# Patient Record
Sex: Female | Born: 1945 | Race: Black or African American | Hispanic: No | Marital: Single | Smoking: Never smoker
Health system: Southern US, Community
[De-identification: ages and names within clinical notes are randomized; demographics above are authoritative.]

## PROBLEM LIST (undated history)

## (undated) DIAGNOSIS — E785 Hyperlipidemia, unspecified: Secondary | ICD-10-CM

## (undated) DIAGNOSIS — D649 Anemia, unspecified: Secondary | ICD-10-CM

## (undated) DIAGNOSIS — E119 Type 2 diabetes mellitus without complications: Secondary | ICD-10-CM

## (undated) DIAGNOSIS — I10 Essential (primary) hypertension: Secondary | ICD-10-CM

## (undated) DIAGNOSIS — Z8673 Personal history of transient ischemic attack (TIA), and cerebral infarction without residual deficits: Secondary | ICD-10-CM

## (undated) DIAGNOSIS — I739 Peripheral vascular disease, unspecified: Secondary | ICD-10-CM

## (undated) DIAGNOSIS — Z992 Dependence on renal dialysis: Secondary | ICD-10-CM

## (undated) DIAGNOSIS — E1140 Type 2 diabetes mellitus with diabetic neuropathy, unspecified: Secondary | ICD-10-CM

## (undated) DIAGNOSIS — E213 Hyperparathyroidism, unspecified: Secondary | ICD-10-CM

## (undated) DIAGNOSIS — N186 End stage renal disease: Secondary | ICD-10-CM

## (undated) DIAGNOSIS — F0390 Unspecified dementia without behavioral disturbance: Secondary | ICD-10-CM

## (undated) SURGERY — Surgical Case
Anesthesia: *Unknown

---

## 2015-10-22 ENCOUNTER — Encounter: Payer: Self-pay | Admitting: Intensive Care

## 2015-10-22 ENCOUNTER — Emergency Department: Payer: Medicare Other

## 2015-10-22 ENCOUNTER — Inpatient Hospital Stay
Admission: EM | Admit: 2015-10-22 | Discharge: 2015-11-07 | DRG: 503 | Disposition: A | Discharge status: Skilled Nursing Facility | Payer: Medicare Other | Attending: Internal Medicine | Admitting: Internal Medicine

## 2015-10-22 DIAGNOSIS — N2581 Secondary hyperparathyroidism of renal origin: Secondary | ICD-10-CM | POA: Diagnosis present

## 2015-10-22 DIAGNOSIS — Y92009 Unspecified place in unspecified non-institutional (private) residence as the place of occurrence of the external cause: Secondary | ICD-10-CM | POA: Diagnosis not present

## 2015-10-22 DIAGNOSIS — L089 Local infection of the skin and subcutaneous tissue, unspecified: Secondary | ICD-10-CM

## 2015-10-22 DIAGNOSIS — Z9115 Patient's noncompliance with renal dialysis: Secondary | ICD-10-CM | POA: Diagnosis not present

## 2015-10-22 DIAGNOSIS — I739 Peripheral vascular disease, unspecified: Secondary | ICD-10-CM | POA: Diagnosis present

## 2015-10-22 DIAGNOSIS — I69351 Hemiplegia and hemiparesis following cerebral infarction affecting right dominant side: Secondary | ICD-10-CM

## 2015-10-22 DIAGNOSIS — I1311 Hypertensive heart and chronic kidney disease without heart failure, with stage 5 chronic kidney disease, or end stage renal disease: Secondary | ICD-10-CM | POA: Diagnosis present

## 2015-10-22 DIAGNOSIS — N186 End stage renal disease: Secondary | ICD-10-CM | POA: Diagnosis present

## 2015-10-22 DIAGNOSIS — Z79899 Other long term (current) drug therapy: Secondary | ICD-10-CM | POA: Diagnosis not present

## 2015-10-22 DIAGNOSIS — Z992 Dependence on renal dialysis: Secondary | ICD-10-CM | POA: Diagnosis not present

## 2015-10-22 DIAGNOSIS — E875 Hyperkalemia: Secondary | ICD-10-CM | POA: Diagnosis present

## 2015-10-22 DIAGNOSIS — M86171 Other acute osteomyelitis, right ankle and foot: Principal | ICD-10-CM | POA: Diagnosis present

## 2015-10-22 DIAGNOSIS — E872 Acidosis: Secondary | ICD-10-CM | POA: Diagnosis not present

## 2015-10-22 DIAGNOSIS — D62 Acute posthemorrhagic anemia: Secondary | ICD-10-CM | POA: Insufficient documentation

## 2015-10-22 DIAGNOSIS — T82856A Stenosis of peripheral vascular stent, initial encounter: Secondary | ICD-10-CM | POA: Diagnosis present

## 2015-10-22 DIAGNOSIS — Z7189 Other specified counseling: Secondary | ICD-10-CM | POA: Diagnosis not present

## 2015-10-22 DIAGNOSIS — Z7982 Long term (current) use of aspirin: Secondary | ICD-10-CM

## 2015-10-22 DIAGNOSIS — E785 Hyperlipidemia, unspecified: Secondary | ICD-10-CM | POA: Diagnosis not present

## 2015-10-22 DIAGNOSIS — M7989 Other specified soft tissue disorders: Secondary | ICD-10-CM

## 2015-10-22 DIAGNOSIS — Z8249 Family history of ischemic heart disease and other diseases of the circulatory system: Secondary | ICD-10-CM

## 2015-10-22 DIAGNOSIS — M869 Osteomyelitis, unspecified: Secondary | ICD-10-CM | POA: Diagnosis not present

## 2015-10-22 DIAGNOSIS — Z1810 Retained metal fragments, unspecified: Secondary | ICD-10-CM

## 2015-10-22 DIAGNOSIS — I10 Essential (primary) hypertension: Secondary | ICD-10-CM

## 2015-10-22 DIAGNOSIS — R778 Other specified abnormalities of plasma proteins: Secondary | ICD-10-CM

## 2015-10-22 DIAGNOSIS — D631 Anemia in chronic kidney disease: Secondary | ICD-10-CM | POA: Diagnosis present

## 2015-10-22 DIAGNOSIS — Z91128 Patient's intentional underdosing of medication regimen for other reason: Secondary | ICD-10-CM | POA: Diagnosis not present

## 2015-10-22 DIAGNOSIS — I69392 Facial weakness following cerebral infarction: Secondary | ICD-10-CM | POA: Diagnosis not present

## 2015-10-22 DIAGNOSIS — E079 Disorder of thyroid, unspecified: Secondary | ICD-10-CM | POA: Diagnosis present

## 2015-10-22 DIAGNOSIS — T3696XA Underdosing of unspecified systemic antibiotic, initial encounter: Secondary | ICD-10-CM | POA: Diagnosis present

## 2015-10-22 DIAGNOSIS — R7989 Other specified abnormal findings of blood chemistry: Secondary | ICD-10-CM

## 2015-10-22 DIAGNOSIS — Y712 Prosthetic and other implants, materials and accessory cardiovascular devices associated with adverse incidents: Secondary | ICD-10-CM | POA: Diagnosis present

## 2015-10-22 DIAGNOSIS — Z515 Encounter for palliative care: Secondary | ICD-10-CM | POA: Diagnosis not present

## 2015-10-22 DIAGNOSIS — R739 Hyperglycemia, unspecified: Secondary | ICD-10-CM

## 2015-10-22 DIAGNOSIS — R531 Weakness: Secondary | ICD-10-CM

## 2015-10-22 DIAGNOSIS — L8990 Pressure ulcer of unspecified site, unspecified stage: Secondary | ICD-10-CM | POA: Insufficient documentation

## 2015-10-22 DIAGNOSIS — B999 Unspecified infectious disease: Secondary | ICD-10-CM

## 2015-10-22 HISTORY — DX: Hyperlipidemia, unspecified: E78.5

## 2015-10-22 HISTORY — DX: Type 2 diabetes mellitus with diabetic neuropathy, unspecified: E11.40

## 2015-10-22 HISTORY — DX: Dependence on renal dialysis: Z99.2

## 2015-10-22 HISTORY — DX: Essential (primary) hypertension: I10

## 2015-10-22 HISTORY — DX: Personal history of transient ischemic attack (TIA), and cerebral infarction without residual deficits: Z86.73

## 2015-10-22 HISTORY — DX: End stage renal disease: N18.6

## 2015-10-22 HISTORY — DX: Type 2 diabetes mellitus without complications: E11.9

## 2015-10-22 LAB — COMPREHENSIVE METABOLIC PANEL
ALBUMIN: 3.4 g/dL — AB (ref 3.5–5.0)
ALK PHOS: 148 U/L — AB (ref 38–126)
ALT: 12 U/L — ABNORMAL LOW (ref 14–54)
AST: 13 U/L — AB (ref 15–41)
Anion gap: 17 — ABNORMAL HIGH (ref 5–15)
BILIRUBIN TOTAL: 0.7 mg/dL (ref 0.3–1.2)
BUN: 143 mg/dL — AB (ref 6–20)
CALCIUM: 9.7 mg/dL (ref 8.9–10.3)
CO2: 10 mmol/L — ABNORMAL LOW (ref 22–32)
CREATININE: 14.15 mg/dL — AB (ref 0.44–1.00)
Chloride: 111 mmol/L (ref 101–111)
GFR calc Af Amer: 3 mL/min — ABNORMAL LOW (ref >60)
GFR calc non Af Amer: 2 mL/min — ABNORMAL LOW (ref >60)
GLUCOSE: 64 mg/dL — AB (ref 65–99)
Potassium: 7.1 mmol/L (ref 3.5–5.1)
Sodium: 138 mmol/L (ref 135–145)
TOTAL PROTEIN: 7 g/dL (ref 6.5–8.1)

## 2015-10-22 LAB — GLUCOSE, CAPILLARY
GLUCOSE-CAPILLARY: 138 mg/dL — AB (ref 65–99)
GLUCOSE-CAPILLARY: 38 mg/dL — AB (ref 65–99)
Glucose-Capillary: 55 mg/dL — ABNORMAL LOW (ref 65–99)
Glucose-Capillary: 144 mg/dL — ABNORMAL HIGH (ref 65–99)
Glucose-Capillary: 56 mg/dL — ABNORMAL LOW (ref 65–99)

## 2015-10-22 LAB — MRSA PCR SCREENING: MRSA BY PCR: NEGATIVE

## 2015-10-22 LAB — CBC
HEMATOCRIT: 35.7 % (ref 35.0–47.0)
HEMOGLOBIN: 11.8 g/dL — AB (ref 12.0–16.0)
MCH: 33 pg (ref 26.0–34.0)
MCHC: 33 g/dL (ref 32.0–36.0)
MCV: 99.9 fL (ref 80.0–100.0)
Platelets: 174 10*3/uL (ref 150–440)
RBC: 3.57 MIL/uL — ABNORMAL LOW (ref 3.80–5.20)
RDW: 13.9 % (ref 11.5–14.5)
WBC: 4.8 10*3/uL (ref 3.6–11.0)

## 2015-10-22 LAB — TSH: TSH: 2.644 u[IU]/mL (ref 0.350–4.500)

## 2015-10-22 MED ORDER — PIPERACILLIN-TAZOBACTAM 3.375 G IVPB 30 MIN
3.3750 g | Freq: Once | INTRAVENOUS | Status: AC
  Administered 2015-10-22: 3.375 g via INTRAVENOUS
  Filled 2015-10-22: qty 50

## 2015-10-22 MED ORDER — SEVELAMER CARBONATE 800 MG PO TABS
1600.0000 mg | ORAL_TABLET | Freq: Three times a day (TID) | ORAL | Status: DC
  Administered 2015-10-23 – 2015-11-07 (×29): 1600 mg via ORAL
  Filled 2015-10-22 (×33): qty 2

## 2015-10-22 MED ORDER — LISINOPRIL 10 MG PO TABS
ORAL_TABLET | ORAL | Status: AC
  Administered 2015-10-22: 10 mg via ORAL
  Filled 2015-10-22: qty 1

## 2015-10-22 MED ORDER — METOPROLOL TARTRATE 25 MG PO TABS
12.5000 mg | ORAL_TABLET | Freq: Two times a day (BID) | ORAL | Status: DC
  Administered 2015-10-22 – 2015-10-30 (×11): 12.5 mg via ORAL
  Filled 2015-10-22 (×12): qty 1

## 2015-10-22 MED ORDER — SODIUM CHLORIDE 0.9 % IV SOLN
250.0000 mL | INTRAVENOUS | Status: DC | PRN
  Administered 2015-11-01 (×2): via INTRAVENOUS

## 2015-10-22 MED ORDER — INSULIN ASPART 100 UNIT/ML ~~LOC~~ SOLN
10.0000 [IU] | Freq: Once | SUBCUTANEOUS | Status: AC
  Administered 2015-10-22: 10 [IU] via INTRAVENOUS
  Filled 2015-10-22: qty 10

## 2015-10-22 MED ORDER — HEPARIN SODIUM (PORCINE) 5000 UNIT/ML IJ SOLN
5000.0000 [IU] | Freq: Three times a day (TID) | INTRAMUSCULAR | Status: DC
  Administered 2015-10-22 – 2015-10-23 (×2): 5000 [IU] via SUBCUTANEOUS
  Filled 2015-10-22 (×2): qty 1

## 2015-10-22 MED ORDER — POLYETHYLENE GLYCOL 3350 17 G PO PACK
17.0000 g | PACK | Freq: Every day | ORAL | Status: DC | PRN
  Administered 2015-10-28: 17 g via ORAL
  Filled 2015-10-22: qty 1

## 2015-10-22 MED ORDER — DOCUSATE SODIUM 100 MG PO CAPS
100.0000 mg | ORAL_CAPSULE | Freq: Two times a day (BID) | ORAL | Status: DC
  Administered 2015-10-22 – 2015-11-07 (×25): 100 mg via ORAL
  Filled 2015-10-22 (×25): qty 1

## 2015-10-22 MED ORDER — ACETAMINOPHEN 650 MG RE SUPP: 650.0000 mg | Freq: Four times a day (QID) | RECTAL | Status: DC | PRN

## 2015-10-22 MED ORDER — CALCIUM GLUCONATE 10 % IV SOLN
INTRAVENOUS | Status: AC
Start: 2015-10-22 — End: 2015-10-23
  Filled 2015-10-22: qty 10

## 2015-10-22 MED ORDER — METOPROLOL TARTRATE 25 MG PO TABS
ORAL_TABLET | ORAL | Status: AC
  Administered 2015-10-22: 12.5 mg via ORAL
  Filled 2015-10-22: qty 1

## 2015-10-22 MED ORDER — SODIUM POLYSTYRENE SULFONATE 15 GM/60ML PO SUSP
30.0000 g | Freq: Once | ORAL | Status: AC
  Administered 2015-10-22: 30 g via ORAL
  Filled 2015-10-22: qty 120

## 2015-10-22 MED ORDER — ASPIRIN EC 81 MG PO TBEC
DELAYED_RELEASE_TABLET | ORAL | Status: AC
  Administered 2015-10-22: 81 mg via ORAL
  Filled 2015-10-22: qty 1

## 2015-10-22 MED ORDER — CINACALCET HCL 30 MG PO TABS
90.0000 mg | ORAL_TABLET | Freq: Every day | ORAL | Status: DC
  Administered 2015-10-25 – 2015-11-05 (×10): 90 mg via ORAL
  Filled 2015-10-22 (×13): qty 3

## 2015-10-22 MED ORDER — VANCOMYCIN HCL IN DEXTROSE 1-5 GM/200ML-% IV SOLN
1000.0000 mg | Freq: Once | INTRAVENOUS | Status: AC
  Administered 2015-10-22: 1000 mg via INTRAVENOUS
  Filled 2015-10-22: qty 200

## 2015-10-22 MED ORDER — MORPHINE SULFATE (PF) 2 MG/ML IV SOLN
2.0000 mg | INTRAVENOUS | Status: DC | PRN
  Administered 2015-10-23 – 2015-11-06 (×11): 2 mg via INTRAVENOUS
  Filled 2015-10-22 (×11): qty 1

## 2015-10-22 MED ORDER — SODIUM CHLORIDE 0.9% FLUSH
3.0000 mL | Freq: Two times a day (BID) | INTRAVENOUS | Status: DC
  Administered 2015-10-22 – 2015-11-07 (×21): 3 mL via INTRAVENOUS

## 2015-10-22 MED ORDER — ATORVASTATIN CALCIUM 20 MG PO TABS
ORAL_TABLET | ORAL | Status: DC
Start: 2015-10-22 — End: 2015-10-22
  Filled 2015-10-22: qty 1

## 2015-10-22 MED ORDER — HYDROCODONE-ACETAMINOPHEN 5-325 MG PO TABS
1.0000 | ORAL_TABLET | ORAL | Status: DC | PRN
  Administered 2015-10-23 – 2015-11-02 (×11): 1 via ORAL
  Administered 2015-11-03: 2 via ORAL
  Administered 2015-11-03 (×2): 1 via ORAL
  Administered 2015-11-04 – 2015-11-06 (×7): 2 via ORAL
  Administered 2015-11-07: 1 via ORAL
  Filled 2015-10-22 (×2): qty 2
  Filled 2015-10-22 (×4): qty 1
  Filled 2015-10-22 (×2): qty 2
  Filled 2015-10-22: qty 1
  Filled 2015-10-22 (×2): qty 2
  Filled 2015-10-22 (×5): qty 1
  Filled 2015-10-22: qty 2
  Filled 2015-10-22 (×2): qty 1
  Filled 2015-10-22: qty 2
  Filled 2015-10-22 (×2): qty 1
  Filled 2015-10-22: qty 2
  Filled 2015-10-22: qty 1

## 2015-10-22 MED ORDER — ONDANSETRON HCL 4 MG PO TABS: 4.0000 mg | ORAL_TABLET | Freq: Four times a day (QID) | ORAL | Status: DC | PRN

## 2015-10-22 MED ORDER — ACETAMINOPHEN 325 MG PO TABS
650.0000 mg | ORAL_TABLET | Freq: Four times a day (QID) | ORAL | Status: DC | PRN
  Administered 2015-11-02: 650 mg via ORAL
  Filled 2015-10-22: qty 2

## 2015-10-22 MED ORDER — ATORVASTATIN CALCIUM 20 MG PO TABS
20.0000 mg | ORAL_TABLET | Freq: Every day | ORAL | Status: DC
  Administered 2015-10-25 – 2015-11-05 (×10): 20 mg via ORAL
  Filled 2015-10-22 (×11): qty 1

## 2015-10-22 MED ORDER — VENLAFAXINE HCL ER 37.5 MG PO CP24
37.5000 mg | ORAL_CAPSULE | Freq: Every day | ORAL | Status: DC
  Administered 2015-10-25 – 2015-11-07 (×11): 37.5 mg via ORAL
  Filled 2015-10-22 (×13): qty 1

## 2015-10-22 MED ORDER — AMLODIPINE BESYLATE 5 MG PO TABS
2.5000 mg | ORAL_TABLET | Freq: Every day | ORAL | Status: DC
  Administered 2015-10-22: 2.5 mg via ORAL

## 2015-10-22 MED ORDER — AMLODIPINE BESYLATE 5 MG PO TABS
ORAL_TABLET | ORAL | Status: AC
  Administered 2015-10-22: 2.5 mg via ORAL
  Filled 2015-10-22: qty 1

## 2015-10-22 MED ORDER — LISINOPRIL 10 MG PO TABS
10.0000 mg | ORAL_TABLET | Freq: Every day | ORAL | Status: DC
  Administered 2015-10-22 – 2015-10-25 (×2): 10 mg via ORAL
  Filled 2015-10-22: qty 1

## 2015-10-22 MED ORDER — PIPERACILLIN-TAZOBACTAM 3.375 G IVPB
3.3750 g | Freq: Two times a day (BID) | INTRAVENOUS | Status: DC
  Administered 2015-10-22 – 2015-10-29 (×14): 3.375 g via INTRAVENOUS
  Filled 2015-10-22 (×16): qty 50

## 2015-10-22 MED ORDER — SODIUM CHLORIDE 0.9 % IV SOLN
1.0000 g | Freq: Once | INTRAVENOUS | Status: DC
  Filled 2015-10-22: qty 10

## 2015-10-22 MED ORDER — ONDANSETRON HCL 4 MG/2ML IJ SOLN
4.0000 mg | Freq: Four times a day (QID) | INTRAMUSCULAR | Status: DC | PRN
  Administered 2015-10-23 – 2015-11-01 (×4): 4 mg via INTRAVENOUS
  Filled 2015-10-22 (×4): qty 2

## 2015-10-22 MED ORDER — SODIUM CHLORIDE 0.9% FLUSH
3.0000 mL | Freq: Two times a day (BID) | INTRAVENOUS | Status: DC
  Administered 2015-10-22 – 2015-11-04 (×19): 3 mL via INTRAVENOUS

## 2015-10-22 MED ORDER — NITROGLYCERIN 2 % TD OINT
1.0000 [in_us] | TOPICAL_OINTMENT | Freq: Four times a day (QID) | TRANSDERMAL | Status: DC
  Administered 2015-10-23 (×2): 1 [in_us] via TOPICAL
  Filled 2015-10-22: qty 30
  Filled 2015-10-22 (×2): qty 1

## 2015-10-22 MED ORDER — DEXTROSE 50 % IV SOLN
1.0000 | Freq: Once | INTRAVENOUS | Status: AC
  Administered 2015-10-22: 50 mL via INTRAVENOUS
  Filled 2015-10-22: qty 50

## 2015-10-22 MED ORDER — SODIUM CHLORIDE 0.9% FLUSH: 3.0000 mL | INTRAVENOUS | Status: DC | PRN

## 2015-10-22 MED ORDER — ASPIRIN EC 81 MG PO TBEC
81.0000 mg | DELAYED_RELEASE_TABLET | Freq: Every day | ORAL | Status: DC
  Administered 2015-10-22 – 2015-11-07 (×12): 81 mg via ORAL
  Filled 2015-10-22 (×11): qty 1

## 2015-10-22 MED ORDER — SENNA 8.6 MG PO TABS
1.0000 | ORAL_TABLET | Freq: Two times a day (BID) | ORAL | Status: DC
  Administered 2015-10-22 – 2015-11-07 (×25): 8.6 mg via ORAL
  Filled 2015-10-22 (×25): qty 1

## 2015-10-22 NOTE — Progress Notes (Signed)
Post HD assessment  

## 2015-10-22 NOTE — Progress Notes (Signed)
START OF HD TX 

## 2015-10-22 NOTE — Progress Notes (Signed)
Pharmacy Antibiotic Note  Tracy CarrowLouvenia Drake is a 70 y.o. female admitted on 10/22/2015 with Osteomyelitis of right great toe.  Pharmacy has been consulted for vancomycin and Zosyn dosing. Patient had PMH of ESRD w/HD and DM, and has missed two days of dialysis. Scr today 14.15.   Plan: Patient received Vancomycin 1gm IV X1 and Zosyn 3.375 IV x1 in ED.  Will need to f/u up with patients HD schedule while in hospital and schedule vancomycin with HD.   Will start patient on Zosyn 3.375 IV EI every 12 hours.   Height: 5\' 2"  (157.5 cm) Weight: 135 lb (61.236 kg) IBW/kg (Calculated) : 50.1  Temp (24hrs), Avg:98.3 F (36.8 C), Min:98.3 F (36.8 C), Max:98.3 F (36.8 C)   Recent Labs Lab 10/22/15 1240  WBC 4.8  CREATININE 14.15*    Estimated Creatinine Clearance: 3.1 mL/min (by C-G formula based on Cr of 14.15).    No Known Allergies  Antimicrobials this admission: 7/10 Vancomcyin >>  7/10 Zosyn >>    Microbiology results: 7/10 BCx: pending  Thank you for allowing pharmacy to be a part of this patient's care.  Tracy Drake, PharmD Clinical Pharmacist 10/22/2015 4:19 PM

## 2015-10-22 NOTE — Progress Notes (Signed)
PRE HD   
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
## 2015-10-22 NOTE — Progress Notes (Signed)
[facility]  ROUNDING NOTE   Subjective:   Patient is a poor historian and unable to recall history. She missed her last two dialysis treatments. Her potassium of 7.1 and emergent hemodialysis treatment.     HEMODIALYSIS FLOWSHEET:  Blood Flow Rate (mL/min): 400 mL/min Arterial Pressure (mmHg): -180 mmHg Venous Pressure (mmHg): 190 mmHg Transmembrane Pressure (mmHg): 40 mmHg Ultrafiltration Rate (mL/min): 830 mL/min Dialysate Flow Rate (mL/min): 600 ml/min Conductivity: Machine : 14 Conductivity: Machine : 14 Dialysis Fluid Bolus: Normal Saline Bolus Amount (mL): 50 mL Dialysate Change: 2K Intra-Hemodialysis Comments: 1000ml removed, pt stablr    Objective:  Vital signs in last 24 hours:  Temp:  [97.4 F (36.3 C)-98.3 F (36.8 C)] 97.4 F (36.3 C) (07/10 1735) Pulse Rate:  [60-82] 74 (07/10 1900) Resp:  [6-27] 17 (07/10 2000) BP: (89-185)/(59-103) 92/64 mmHg (07/10 2000) SpO2:  [98 %-100 %] 100 % (07/10 1900) Weight:  [61.236 kg (135 lb)] 61.236 kg (135 lb) (07/10 1250)  Weight change:  Filed Weights   10/22/15 1250  Weight: 61.236 kg (135 lb)    Intake/Output:     Intake/Output this shift:     Physical Exam: General: NAD, laying in bed  Head: +hard of hearing  Eyes: Anicteric, PERRL  Neck: Supple, trachea midline  Lungs:  Clear to auscultation  Heart: Regular rate and rhythm  Abdomen:  Soft, nontender,   Extremities: no peripheral edema.  Neurologic: Oriented to self only  Skin: No lesions  Access: RIJ permcath    Basic Metabolic Panel:  Recent Labs Lab 10/22/15 1240  NA 138  K 7.1*  CL 111  CO2 10*  GLUCOSE 64*  BUN 143*  CREATININE 14.15*  CALCIUM 9.7    Liver Function Tests:  Recent Labs Lab 10/22/15 1240  AST 13*  ALT 12*  ALKPHOS 148*  BILITOT 0.7  PROT 7.0  ALBUMIN 3.4*   No results for input(s): LIPASE, AMYLASE in the last 168 hours. No results for input(s): AMMONIA in the last 168  hours.  CBC:  Recent Labs Lab 10/22/15 1240  WBC 4.8  HGB 11.8*  HCT 35.7  MCV 99.9  PLT 174    Cardiac Enzymes: No results for input(s): CKTOTAL, CKMB, CKMBINDEX, TROPONINI in the last 168 hours.  BNP: Invalid input(s): POCBNP  CBG:  Recent Labs Lab 10/22/15 1310 10/22/15 1502  GLUCAP 55* 144*    Microbiology: No results found for this or any previous visit.  Coagulation Studies: No results for input(s): LABPROT, INR in the last 72 hours.  Urinalysis: No results for input(s): COLORURINE, LABSPEC, PHURINE, GLUCOSEU, HGBUR, BILIRUBINUR, KETONESUR, PROTEINUR, UROBILINOGEN, NITRITE, LEUKOCYTESUR in the last 72 hours.  Invalid input(s): APPERANCEUR    Imaging: Dg Chest Portable 1 View  10/22/2015  CLINICAL DATA:  Right great toe pain. Infection. The patient has missed her last 2 dialysis appointments. EXAM: PORTABLE CHEST 1 VIEW COMPARISON:  None. FINDINGS: The heart is mildly enlarged. Atherosclerotic calcifications are present within the aortic arch. A left subclavian vein stent is in place. A right IJ dialysis catheter is in place. The tip is in the right ventricle. Mild pulmonary vascular congestion is present without frank edema. No focal airspace consolidation is present. The visualized soft tissues and bony thorax are unremarkable. IMPRESSION: 1. Mild cardiomegaly and pulmonary vascular congestion. 2. Atherosclerosis of the aortic arch. 3. Left subclavian stent. Electronically Signed   By: Marin Robertshristopher  Mattern M.D.   On: 10/22/2015 13:54   Dg Toe Great Right  10/22/2015  CLINICAL DATA:  Right great toe pain. Toe nail removed approximately 1 week ago without healing. EXAM: RIGHT GREAT TOE COMPARISON:  None. FINDINGS: There are vague lucencies within the tuft of the distal phalanx. No fracture line or displaced fracture fragment seen. Atherosclerotic changes noted within the surrounding soft tissues. IMPRESSION: 1. Vague lucencies within the tuft of the distal phalanx  of the great toe. Based on the lateral projection, this is suspected to be due to overlying soft tissue edema. Demineralization and/or early destructive changes of osteomyelitis cannot be excluded. If clinical concern for osteomyelitis, recommend MRI for more definitive characterization. 2. Atherosclerosis. Electronically Signed   By: Bary Richard M.D.   On: 10/22/2015 13:57     Medications:     . amLODipine  2.5 mg Oral Daily  . aspirin EC  81 mg Oral Daily  . atorvastatin  20 mg Oral q1800  . calcium gluconate 1 GM IV  1 g Intravenous Once  . calcium gluconate      . cinacalcet  90 mg Oral Q supper  . lisinopril  10 mg Oral Daily  . metoprolol tartrate  12.5 mg Oral BID  . nitroGLYCERIN  1 inch Topical Q6H  . sevelamer carbonate  1,600 mg Oral TID WC  . [START ON 10/23/2015] venlafaxine XR  37.5 mg Oral Q breakfast     Assessment/ Plan:  Ms. Gwenivere Hiraldo is a 70 y.o. black female with hypertension, diabetes mellitus type II, hyperlipidemia, thyroid disorder and ESRD on hemodialysis. Pt recently moved from [place] via [place]. Now staying with her sister.   [facility] TTS [address]  RIJ permcath  1. End Stage Renal Disease with hyperkalemia: requiring emergent hemodialysis. Seen and examined on treatment.   2. Hypertension: hypertensive on admission but now low on treatment - home regimen of amlodipine, lisinopril  3. Secondary Hyperparathyroidism:  - continue sevelamer for phos binding - cinacalcet  4. Anemia of chronic kidney disease: hemoglobin 11.8 - no acute indication for epo    LOS: 0 Ethelbert Thain 7/10/20178:16 PM

## 2015-10-22 NOTE — ED Provider Notes (Signed)
[facility] Emergency Department Provider Note   ____________________________________________    I have reviewed the triage vital signs and the nursing notes.   HISTORY  Chief Complaint Toe Pain  History limited by poor historian   HPI Tracy Drake is a 70 y.o. female who presents for reported right great toe pain. Apparently the patient had a procedure on the toenail about one week ago.She also reports that she missed 2 dialysis appointments. It is not clear who called the ambulance or why. Glucose reported at 56. Patient complains of right toe pain but otherwise denies shortness of breath or chest pain.  Past medical history End-stage renal disease Hypertension past medical history  Past surgical history Left AV fistula Right subclavian catheter    No current outpatient prescriptions on file.  Allergies Review of patient's allergies indicates no known allergies.  History reviewed. No pertinent family history.  Social History Social History  Substance Use Topics  . Smoking status: Never Smoker   . Smokeless tobacco: Never Used  . Alcohol Use: No    Review of SystemsLimited  Constitutional: No fever/chills Eyes: No visual changes.  ENT: No sore throat. Cardiovascular: Denies chest pain. Respiratory: Denies shortness of breath. Gastrointestinal: No abdominal pain.   Genitourinary: Negative for dysuria. Musculoskeletal: Right toe pain Skin: Negative for rash. Neurological: Negative for headaches or weakness  10-point ROS otherwise negative.  ____________________________________________   PHYSICAL EXAM:  VITAL SIGNS: ED Triage Vitals  Enc Vitals Group     BP 10/22/15 1250 170/60 mmHg     Pulse Rate 10/22/15 1250 78     Resp 10/22/15 1250 17     Temp 10/22/15 1250 98.3 F (36.8 C)     Temp Source 10/22/15 1250 Oral     SpO2 10/22/15 1250 100 %     Weight 10/22/15 1250 135 lb (61.236 kg)     Height --      Head  Cir --      Peak Flow --      Pain Score 10/22/15 1253 9     Pain Loc --      Pain Edu? --      Excl. in GC? --     Constitutional: Alert and oriented. No acute distress. Poor historian Eyes: Conjunctivae are normal.  Head: Atraumatic.Normocephalic Nose: No congestion/rhinnorhea. Mouth/Throat: Mucous membranes are moist.   Cardiovascular: Normal rate, regular rhythm. Grossly normal heart sounds.  Good peripheral circulation. Respiratory: Normal respiratory effort.  No retractions. Lungs CTAB. Gastrointestinal: Soft and nontender. No distention.  No CVA tenderness. Genitourinary: deferred Musculoskeletal: No lower extremity tenderness nor edema.  Warm and well perfused feet bilaterally, right great toe is dusky and foul smelling, 2+ DP pulses bilaterally Neurologic:  Normal speech and language. No gross focal neurologic deficits are appreciated.  Skin:  Skin is warm, dry and intact. No rash noted. Psychiatric: Mood and affect are normal. Speech and behavior are normal.  ____________________________________________   LABS (all labs ordered are listed, but only abnormal results are displayed)  Labs Reviewed  CBC - Abnormal; Notable for the following:    RBC 3.57 (*)    Hemoglobin 11.8 (*)    All other components within normal limits  COMPREHENSIVE METABOLIC PANEL - Abnormal; Notable for the following:    Potassium 7.1 (*)    CO2 10 (*)    Glucose, Bld 64 (*)    BUN 143 (*)    Creatinine, Ser 14.15 (*)    Albumin 3.4 (*)  AST 13 (*)    ALT 12 (*)    Alkaline Phosphatase 148 (*)    GFR calc non Af Amer 2 (*)    GFR calc Af Amer 3 (*)    Anion gap 17 (*)    All other components within normal limits  GLUCOSE, CAPILLARY - Abnormal; Notable for the following:    Glucose-Capillary 55 (*)    All other components within normal limits  CULTURE, BLOOD (ROUTINE X 2)  CULTURE, BLOOD (ROUTINE X 2)   ____________________________________________  EKG  ED ECG REPORT I, Jene EveryKINNER,  Lokelani Lutes, the attending physician, personally viewed and interpreted this ECG.  Date: 10/22/2015 EKG Time: 12:09 PM Rate: 69 Rhythm: normal sinus rhythm QRS Axis: normal Intervals: normal ST/T Wave abnormalities: Nonspecific changes Conduction Disturbances: none Narrative Interpretation: unremarkable  ____________________________________________  RADIOLOGY  Right great toe x-ray concerning for possible osteomyelitis ____________________________________________   PROCEDURES  Procedure(s) performed: No    Critical Care performed: yes  CRITICAL CARE Performed by: Jene EveryKINNER, Jose Alleyne   Total critical care time: 30 minutes  Critical care time was exclusive of separately billable procedures and treating other patients.  Critical care was necessary to treat or prevent imminent or life-threatening deterioration.  Critical care was time spent personally by me on the following activities: development of treatment plan with patient and/or surrogate as well as nursing, discussions with consultants, evaluation of patient's response to treatment, examination of patient, obtaining history from patient or surrogate, ordering and performing treatments and interventions, ordering and review of laboratory studies, ordering and review of radiographic studies, pulse oximetry and re-evaluation of patient's condition.  ____________________________________________   INITIAL IMPRESSION / ASSESSMENT AND PLAN / ED COURSE  Pertinent labs & imaging results that were available during my care of the patient were reviewed by me and considered in my medical decision making (see chart for details).  Patient presents with right great toe pain. She also complains of missing dialysis 2. We will check labs, x-ray right great toe and reevaluate.  Patient's potassium is 7.1, discussed with nephrology Dr. Ronn MelenaKolloru who recommends Kayexalate, insulin, dextrose, calcium and he will arrange dialysis  Toe x-ray  concerning for possible ostial myelitis, broad-spectrum IV antibiotics started  ____________________________________________   FINAL CLINICAL IMPRESSION(S) / ED DIAGNOSES  Final diagnoses:  Acute hyperkalemia  Toe infection  Acute osteomyelitis of toe, right (HCC)      NEW MEDICATIONS STARTED DURING THIS VISIT:  New Prescriptions   No medications on file     Note:  This document was prepared using Dragon voice recognition software and may include unintentional dictation errors.    Jene Everyobert Alfons Sulkowski, MD 10/22/15 1447

## 2015-10-22 NOTE — Progress Notes (Signed)
HD tx ended early due to low b/p.  MD aware.

## 2015-10-22 NOTE — ED Notes (Signed)
Patient arrived by EMS from home. Patient came in for R great toe pain. Patient has the toenail removed about a week ago. Patient has missed her last two dialysis appointments. Patient has dialysis cath on R chest. EMS blood sugar 56. Patient is A&O x4

## 2015-10-22 NOTE — Progress Notes (Signed)
PRE HD assessment 

## 2015-10-22 NOTE — Progress Notes (Signed)
Post Hd   

## 2015-10-22 NOTE — ED Notes (Signed)
Attempted an IV in the right hand without success.

## 2015-10-23 ENCOUNTER — Encounter: Payer: Self-pay | Admitting: Physician Assistant

## 2015-10-23 ENCOUNTER — Inpatient Hospital Stay: Admit: 2015-10-23 | Payer: Medicare Other

## 2015-10-23 ENCOUNTER — Inpatient Hospital Stay (HOSPITAL_COMMUNITY)
Admit: 2015-10-23 | Discharge: 2015-10-23 | Disposition: A | Discharge status: Home / Self Care | Payer: Medicare Other | Attending: Physician Assistant | Admitting: Physician Assistant

## 2015-10-23 DIAGNOSIS — R7989 Other specified abnormal findings of blood chemistry: Secondary | ICD-10-CM

## 2015-10-23 DIAGNOSIS — E785 Hyperlipidemia, unspecified: Secondary | ICD-10-CM

## 2015-10-23 DIAGNOSIS — I10 Essential (primary) hypertension: Secondary | ICD-10-CM

## 2015-10-23 LAB — GLUCOSE, CAPILLARY
GLUCOSE-CAPILLARY: 77 mg/dL (ref 65–99)
GLUCOSE-CAPILLARY: 117 mg/dL — AB (ref 65–99)
GLUCOSE-CAPILLARY: 73 mg/dL (ref 65–99)
GLUCOSE-CAPILLARY: 92 mg/dL (ref 65–99)

## 2015-10-23 LAB — CBC
HCT: 34 % — ABNORMAL LOW (ref 35.0–47.0)
Hemoglobin: 11.6 g/dL — ABNORMAL LOW (ref 12.0–16.0)
MCH: 33.4 pg (ref 26.0–34.0)
MCHC: 34.2 g/dL (ref 32.0–36.0)
MCV: 97.6 fL (ref 80.0–100.0)
Platelets: 167 10*3/uL (ref 150–440)
RBC: 3.48 MIL/uL — AB (ref 3.80–5.20)
RDW: 13.8 % (ref 11.5–14.5)
WBC: 4.3 10*3/uL (ref 3.6–11.0)

## 2015-10-23 LAB — BASIC METABOLIC PANEL
ANION GAP: 12 (ref 5–15)
BUN: 73 mg/dL — ABNORMAL HIGH (ref 6–20)
CALCIUM: 9 mg/dL (ref 8.9–10.3)
CO2: 24 mmol/L (ref 22–32)
Chloride: 103 mmol/L (ref 101–111)
Creatinine, Ser: 9.51 mg/dL — ABNORMAL HIGH (ref 0.44–1.00)
GFR calc non Af Amer: 4 mL/min — ABNORMAL LOW (ref >60)
GFR, EST AFRICAN AMERICAN: 4 mL/min — AB (ref >60)
Glucose, Bld: 87 mg/dL (ref 65–99)
Potassium: 4.7 mmol/L (ref 3.5–5.1)
Sodium: 139 mmol/L (ref 135–145)

## 2015-10-23 LAB — RENAL FUNCTION PANEL
ALBUMIN: 3.1 g/dL — AB (ref 3.5–5.0)
ANION GAP: 13 (ref 5–15)
BUN: 69 mg/dL — AB (ref 6–20)
CHLORIDE: 106 mmol/L (ref 101–111)
CO2: 20 mmol/L — AB (ref 22–32)
Calcium: 8.8 mg/dL — ABNORMAL LOW (ref 8.9–10.3)
Creatinine, Ser: 9.01 mg/dL — ABNORMAL HIGH (ref 0.44–1.00)
GFR calc Af Amer: 4 mL/min — ABNORMAL LOW (ref >60)
GFR, EST NON AFRICAN AMERICAN: 4 mL/min — AB (ref >60)
GLUCOSE: 66 mg/dL (ref 65–99)
PHOSPHORUS: 4.8 mg/dL — AB (ref 2.5–4.6)
POTASSIUM: 5.1 mmol/L (ref 3.5–5.1)
Sodium: 139 mmol/L (ref 135–145)

## 2015-10-23 LAB — PROTIME-INR
INR: 1.11
PROTHROMBIN TIME: 14.5 s (ref 11.4–15.0)

## 2015-10-23 LAB — HEPATITIS B CORE ANTIBODY, TOTAL: Hep B Core Total Ab: POSITIVE — AB

## 2015-10-23 LAB — TROPONIN I
Troponin I: <0.03 ng/mL (ref <0.03)
Troponin I: 1.27 ng/mL (ref <0.03)

## 2015-10-23 LAB — HEMOGLOBIN A1C: HEMOGLOBIN A1C: 4.8 % (ref 4.0–6.0)

## 2015-10-23 LAB — HEPATITIS B SURFACE ANTIGEN: HEP B S AG: NEGATIVE

## 2015-10-23 LAB — APTT: APTT: 47 s — AB (ref 24–36)

## 2015-10-23 LAB — HEPATITIS B SURFACE ANTIBODY,QUALITATIVE: Hep B S Ab: REACTIVE

## 2015-10-23 MED ORDER — VANCOMYCIN HCL IN DEXTROSE 750-5 MG/150ML-% IV SOLN
750.0000 mg | INTRAVENOUS | Status: DC
  Administered 2015-10-23 – 2015-10-25 (×2): 750 mg via INTRAVENOUS
  Filled 2015-10-23 (×3): qty 150

## 2015-10-23 MED ORDER — HEPARIN SODIUM (PORCINE) 5000 UNIT/ML IJ SOLN
5000.0000 [IU] | Freq: Three times a day (TID) | INTRAMUSCULAR | Status: DC
  Administered 2015-10-23 – 2015-11-07 (×33): 5000 [IU] via SUBCUTANEOUS
  Filled 2015-10-23 (×33): qty 1

## 2015-10-23 MED ORDER — VANCOMYCIN HCL IN DEXTROSE 750-5 MG/150ML-% IV SOLN
750.0000 mg | Freq: Once | INTRAVENOUS | Status: AC
  Administered 2015-10-23: 750 mg via INTRAVENOUS
  Filled 2015-10-23: qty 150

## 2015-10-23 MED ORDER — HEPARIN BOLUS VIA INFUSION
3000.0000 [IU] | Freq: Once | INTRAVENOUS | Status: DC
  Filled 2015-10-23: qty 3000

## 2015-10-23 MED ORDER — CHLORHEXIDINE GLUCONATE CLOTH 2 % EX PADS: 6.0000 | MEDICATED_PAD | Freq: Once | CUTANEOUS | Status: DC

## 2015-10-23 MED ORDER — HEPARIN (PORCINE) IN NACL 100-0.45 UNIT/ML-% IJ SOLN
850.0000 [IU]/h | INTRAMUSCULAR | Status: DC
  Filled 2015-10-23: qty 250

## 2015-10-23 MED ORDER — SODIUM CHLORIDE 0.9 % IV SOLN
INTRAVENOUS | Status: DC
  Administered 2015-10-23: 20:00:00 via INTRAVENOUS

## 2015-10-23 MED ORDER — METOCLOPRAMIDE HCL 5 MG/ML IJ SOLN
5.0000 mg | Freq: Once | INTRAMUSCULAR | Status: AC
  Administered 2015-10-25: 5 mg via INTRAVENOUS
  Filled 2015-10-23: qty 2

## 2015-10-23 NOTE — Progress Notes (Signed)
Attempted to contact pt sister for consents for procedures.

## 2015-10-23 NOTE — Progress Notes (Signed)
ANTICOAGULATION CONSULT NOTE - Initial Consult  Pharmacy Consult for Heparin drip Indication: ischemic toe/elevated troponin  No Known Allergies  Patient Measurements: Height: 5\' 2"  (157.5 cm) Weight: 138 lb 1.6 oz (62.642 kg) IBW/kg (Calculated) : 50.1 Heparin Dosing Weight: 62.6 kg  Vital Signs: Temp: 97.9 F (36.6 C) (07/11 0741) Temp Source: Oral (07/11 0741) BP: 116/51 mmHg (07/11 0741) Pulse Rate: 67 (07/11 0741)  Labs:  Recent Labs  10/22/15 1240 10/22/15 2145 10/23/15 0655  HGB 11.8*  --   --   HCT 35.7  --   --   PLT 174  --   --   CREATININE 14.15*  --  9.01*  TROPONINI  --  <0.03 1.27*    Estimated Creatinine Clearance: 5 mL/min (by C-G formula based on Cr of 9.01).  Assessment: 70 yo female with possible osteomyelitis of right great toe on abx now starting heparin drip for possible ischemic toe and elevated troponin.   Pt last received dose of SQ heparin at 0645 (~4-5 h ago).   STAT CBC, aPTT, INR ordered for baseline labs. Called lab to see if labs could be added on but no tube available. Per lab, pt is a hard stick and AM labs were drawn from a fingerstick. Pt with limited sites for blood draws as pt is an HD pt; said would try to obtain labs.   7/10: Hgb 11.8, Plt 174  Goal of Therapy:  Heparin level 0.3-0.7 units/ml Monitor platelets by anticoagulation protocol: Yes   Plan:  Heparin 3000 units IV x1 bolus then 850 units/hr (=8.5 ml/hr) Heparin level in 8h CBC in AM  Pharmacy will continue to follow.   Crist FatWang, Jessamyn Watterson L 10/23/2015,11:25 AM

## 2015-10-23 NOTE — Progress Notes (Signed)
Spoke with Dr. Wyn Quakerew and also Eula Listenyan Dunn, PA. Plan for tomorrow is to proceed with angiogram, nuclear stress test will be cancelled. Will continue to attempt to contact family members to update with plan.

## 2015-10-23 NOTE — Progress Notes (Signed)
Patient ID: Tracy Drake, female   DOB: 1946/02/07, 70 y.o.   MRN: [id number] [facility] PROGRESS NOTE  Amee Boothe WUJ:[id number] DOB: 01-11-1946 DOA: 10/22/2015 PCP: Pcp Not In System  HPI/Subjective: Patient complaining of right first toe pain. No complaints of chest pain or shortness of breath.  Objective: Filed Vitals:   10/23/15 0412 10/23/15 0741  BP: 123/58 116/51  Pulse: 73 67  Temp: 98.2 F (36.8 C) 97.9 F (36.6 C)  Resp: 19     Filed Weights   10/22/15 1250 10/22/15 2134 10/23/15 0436  Weight: 61.236 kg (135 lb) 62.279 kg (137 lb 4.8 oz) 62.642 kg (138 lb 1.6 oz)    ROS: Review of Systems  Constitutional: Negative for fever and chills.  Eyes: Negative for blurred vision.  Respiratory: Negative for cough and shortness of breath.   Cardiovascular: Negative for chest pain.  Gastrointestinal: Positive for nausea and vomiting. Negative for abdominal pain, diarrhea and constipation.  Genitourinary: Negative for dysuria.  Musculoskeletal: Positive for joint pain.  Neurological: Negative for dizziness and headaches.   Exam: Physical Exam  HENT:  Nose: No mucosal edema.  Mouth/Throat: No oropharyngeal exudate or posterior oropharyngeal edema.  Eyes: Conjunctivae, EOM and lids are normal. Pupils are equal, round, and reactive to light.  Neck: No JVD present. Carotid bruit is not present. No edema present. No thyroid mass and no thyromegaly present.  Cardiovascular: S1 normal and S2 normal.  Exam reveals no gallop.   Murmur heard.  Systolic murmur is present with a grade of 2/6  Pulses:      Dorsalis pedis pulses are 0 on the right side, and 0 on the left side.  Respiratory: No respiratory distress. She has no wheezes. She has no rhonchi. She has no rales.  GI: Soft. Bowel sounds are normal. There is no tenderness.  Musculoskeletal:       Right ankle: She exhibits no swelling.       Left ankle: She exhibits no swelling.  Lymphadenopathy:    She has no  cervical adenopathy.  Neurological: She is alert.  Skin: Skin is warm. Nails show no clubbing.  Right first toe with blackish discoloration looks like it's starting to demarcate.  Psychiatric: She has a normal mood and affect.      Data Reviewed: Basic Metabolic Panel:  Recent Labs Lab 10/22/15 1240 10/23/15 0655  NA 138 139  K 7.1* 5.1  CL 111 106  CO2 10* 20*  GLUCOSE 64* 66  BUN 143* 69*  CREATININE 14.15* 9.01*  CALCIUM 9.7 8.8*  PHOS  --  4.8*   Liver Function Tests:  Recent Labs Lab 10/22/15 1240 10/23/15 0655  AST 13*  --   ALT 12*  --   ALKPHOS 148*  --   BILITOT 0.7  --   PROT 7.0  --   ALBUMIN 3.4* 3.1*   CBC:  Recent Labs Lab 10/22/15 1240  WBC 4.8  HGB 11.8*  HCT 35.7  MCV 99.9  PLT 174   Cardiac Enzymes:  Recent Labs Lab 10/22/15 2145 10/23/15 0655  TROPONINI <0.03 1.27*    CBG:  Recent Labs Lab 10/22/15 1502 10/22/15 2127 10/22/15 2223 10/22/15 2333 10/23/15 0751  GLUCAP 144* 38* 56* 138* 77    Recent Results (from the past 240 hour(s))  MRSA PCR Screening     Status: None   Collection Time: 10/22/15 10:09 PM  Result Value Ref Range Status   MRSA by PCR NEGATIVE NEGATIVE Final    Comment:  The GeneXpert MRSA Assay (FDA approved for NASAL specimens only), is one component of a comprehensive MRSA colonization surveillance program. It is not intended to diagnose MRSA infection nor to guide or monitor treatment for MRSA infections.      Studies: Dg Chest Portable 1 View  10/22/2015  CLINICAL DATA:  Right great toe pain. Infection. The patient has missed her last 2 dialysis appointments. EXAM: PORTABLE CHEST 1 VIEW COMPARISON:  None. FINDINGS: The heart is mildly enlarged. Atherosclerotic calcifications are present within the aortic arch. A left subclavian vein stent is in place. A right IJ dialysis catheter is in place. The tip is in the right ventricle. Mild pulmonary vascular congestion is present without  frank edema. No focal airspace consolidation is present. The visualized soft tissues and bony thorax are unremarkable. IMPRESSION: 1. Mild cardiomegaly and pulmonary vascular congestion. 2. Atherosclerosis of the aortic arch. 3. Left subclavian stent. Electronically Signed   By: Marin Robertshristopher  Mattern M.D.   On: 10/22/2015 13:54   Dg Toe Great Right  10/22/2015  CLINICAL DATA:  Right great toe pain. Toe nail removed approximately 1 week ago without healing. EXAM: RIGHT GREAT TOE COMPARISON:  None. FINDINGS: There are vague lucencies within the tuft of the distal phalanx. No fracture line or displaced fracture fragment seen. Atherosclerotic changes noted within the surrounding soft tissues. IMPRESSION: 1. Vague lucencies within the tuft of the distal phalanx of the great toe. Based on the lateral projection, this is suspected to be due to overlying soft tissue edema. Demineralization and/or early destructive changes of osteomyelitis cannot be excluded. If clinical concern for osteomyelitis, recommend MRI for more definitive characterization. 2. Atherosclerosis. Electronically Signed   By: Bary RichardStan  Maynard M.D.   On: 10/22/2015 13:57    Scheduled Meds: . amLODipine  2.5 mg Oral Daily  . aspirin EC  81 mg Oral Daily  . atorvastatin  20 mg Oral q1800  . calcium gluconate 1 GM IV  1 g Intravenous Once  . cinacalcet  90 mg Oral Q supper  . docusate sodium  100 mg Oral BID  . lisinopril  10 mg Oral Daily  . metoprolol tartrate  12.5 mg Oral BID  . piperacillin-tazobactam (ZOSYN)  IV  3.375 g Intravenous Q12H  . senna  1 tablet Oral BID  . sevelamer carbonate  1,600 mg Oral TID WC  . sodium chloride flush  3 mL Intravenous Q12H  . sodium chloride flush  3 mL Intravenous Q12H  . vancomycin  750 mg Intravenous Once  . venlafaxine XR  37.5 mg Oral Q breakfast    Assessment/Plan:  1. Right first toe pain. With blackish discoloration today this could be an ischemic toe. I will start heparin drip. X-ray could  not rule out an osteomyelitis and patient was placed on antibiotics and an MRI was ordered. Await podiatry and vascular surgery consultations. 2. Hyperkalemia on presentation secondary to missed hemodialysis sessions. Hemodialysis last night improved potassium for today. 3. Elevated troponin. Unclear why troponin was ordered. Patient not having chest pain or shortness of breath. Patient already on aspirin and metoprolol and statin. Cardiology consultation. 4. End-stage renal disease on hemodialysis as per nephrology 5. Anemia of chronic disease 6. Hemoglobin A1c is low patient is not a diabetic 7. Essential hypertension patient had a low blood pressure yesterday. Continue to monitor today.  Code Status:     Code Status Orders        Start     Ordered   10/22/15 2049  Full code   Continuous     10/22/15 2048    Code Status History    Date Active Date Inactive Code Status Order ID Comments User Context   This patient has a current code status but no historical code status.     Family Communication: Left message for sister Disposition Plan: To be determined  Consultants:  Cardiology  Nephrology  Podiatry  Vascular surgery  Antibiotics:  Vancomycin  Zosyn  Time spent: 28 minutes  Alford Highland  [employer]

## 2015-10-23 NOTE — Progress Notes (Signed)
Post hd tx 

## 2015-10-23 NOTE — Progress Notes (Signed)
Patient ID: Tracy CarrowLouvenia Drake, female   DOB: 01/26/1946, 70 y.o.   MRN: [id number]  With poor iv acess, unable to do heparin drip 3rd troponin negative.  Questionable error on 2nd troponin. Await vascular opinion on toe  Dr Renae GlossWieting

## 2015-10-23 NOTE — Progress Notes (Signed)
Pre-hd tx 

## 2015-10-23 NOTE — Consult Note (Signed)
Cardiology Consultation Note  Patient ID: Tracy Drake, MRN: [id number], DOB/AGE: [birth year] 70 y.o. Admit date: 10/22/2015   Date of Consult: 10/23/2015 Primary Physician: Pcp Not In System Primary Cardiologist: New to [facility] Requesting Physician: Dr. Renae Gloss, MD  Chief Complaint: Toe pain Reason for Consult: Elevated troponin   HPI: 70 y.o. female with h/o ESRD on HD, DM2, HTN, history of stroke with residual facial droop, HLD, and diabetic neuropathy who presented for right great toe pain. Cardiology is consulted for elevated troponin.  Patient presented to [facility] on 7/10 with complaints of right great toe pain. Never with chest pain or SOB. She was found to have osteomyelitis of the right great toe and possibly needing amputation.   Upon the patient's arrival to [facility] they were found to have potassium of 7.1 s/p emergent HD on 7/10 due ot hyperkalemia, metabolic acidosis, and uremina, WBC 4.8, hgb 11.8, troponin <0.03-->1.27. A1C 4.8%. Blood cultures pending. ECG as below, CXR showed mild cardiomegaly with pulmonary vascular congestion. She has continued to complain of right toe pain.   She has never complained of chest pain. Troponin was checked for unclear reasons and initially found to be negative. Troponin was checked again in the setting of an asymptomatic patient and found to be 1.27. Cardiology is asked to explain this troponin.   Past Medical History  Diagnosis Date  . Diabetes mellitus (HCC)     a. not on medications  . History of stroke   . ESRD (end stage renal disease) on dialysis (HCC)   . Essential hypertension   . HLD (hyperlipidemia)   . Diabetic neuropathy (HCC)       Most Recent Cardiac Studies: none   Surgical History: History reviewed. No pertinent past surgical history.   Home Meds: Prior to Admission medications   Medication Sig Start Date End Date Taking? Authorizing Provider  amLODipine (NORVASC) 2.5 MG tablet Take 2.5 mg by mouth daily.   Yes Historical  Provider, MD  aspirin EC 81 MG tablet Take 81 mg by mouth daily.   Yes Historical Provider, MD  atorvastatin (LIPITOR) 20 MG tablet Take 20 mg by mouth daily at 6 PM.   Yes Historical Provider, MD  cinacalcet (SENSIPAR) 90 MG tablet Take 90 mg by mouth daily with supper.   Yes Historical Provider, MD  lisinopril (PRINIVIL,ZESTRIL) 10 MG tablet Take 10 mg by mouth daily.   Yes Historical Provider, MD  sevelamer carbonate (RENVELA) 800 MG tablet Take 1,600 mg by mouth 3 (three) times daily with meals.   Yes Historical Provider, MD  venlafaxine XR (EFFEXOR-XR) 37.5 MG 24 hr capsule Take 37.5 mg by mouth daily with breakfast.   Yes Historical Provider, MD    Inpatient Medications:  . amLODipine  2.5 mg Oral Daily  . aspirin EC  81 mg Oral Daily  . atorvastatin  20 mg Oral q1800  . calcium gluconate 1 GM IV  1 g Intravenous Once  . cinacalcet  90 mg Oral Q supper  . docusate sodium  100 mg Oral BID  . heparin  3,000 Units Intravenous Once  . lisinopril  10 mg Oral Daily  . metoCLOPramide (REGLAN) injection  5 mg Intravenous Once  . metoprolol tartrate  12.5 mg Oral BID  . piperacillin-tazobactam (ZOSYN)  IV  3.375 g Intravenous Q12H  . senna  1 tablet Oral BID  . sevelamer carbonate  1,600 mg Oral TID WC  . sodium chloride flush  3 mL Intravenous Q12H  . sodium chloride  flush  3 mL Intravenous Q12H  . vancomycin  750 mg Intravenous Once  . vancomycin  750 mg Intravenous Q T,Th,Sa-HD  . venlafaxine XR  37.5 mg Oral Q breakfast   . heparin      Allergies: No Known Allergies  Social History   Social History  . Marital Status: Single    Spouse Name: N/A  . Number of Children: N/A  . Years of Education: N/A   Occupational History  . Not on file.   Social History Main Topics  . Smoking status: Never Smoker   . Smokeless tobacco: Never Used  . Alcohol Use: No  . Drug Use: Not on file  . Sexual Activity: Not on file   Other Topics Concern  . Not on file   Social History  Narrative     Family History  Problem Relation Age of Onset  . Hypertension Mother      Review of Systems: Review of Systems  Constitutional: Positive for malaise/fatigue. Negative for fever, chills and weight loss.  HENT: Negative for congestion.   Eyes: Negative for discharge and redness.  Respiratory: Negative for cough and shortness of breath.   Cardiovascular: Negative for chest pain, palpitations, orthopnea, claudication, leg swelling and PND.  Gastrointestinal: Negative for nausea and vomiting.  Musculoskeletal: Positive for joint pain. Negative for falls.  Neurological: Positive for weakness. Negative for dizziness, sensory change, speech change, focal weakness and loss of consciousness.  Endo/Heme/Allergies: Does not bruise/bleed easily.  Psychiatric/Behavioral: The patient is not nervous/anxious.   All other systems reviewed and are negative.   Labs:  Recent Labs  10/22/15 2145 10/23/15 0655  TROPONINI <0.03 1.27*   Lab Results  Component Value Date   WBC 4.8 10/22/2015   HGB 11.8* 10/22/2015   HCT 35.7 10/22/2015   MCV 99.9 10/22/2015   PLT 174 10/22/2015     Recent Labs Lab 10/22/15 1240 10/23/15 0655  NA 138 139  K 7.1* 5.1  CL 111 106  CO2 10* 20*  BUN 143* 69*  CREATININE 14.15* 9.01*  CALCIUM 9.7 8.8*  PROT 7.0  --   BILITOT 0.7  --   ALKPHOS 148*  --   ALT 12*  --   AST 13*  --   GLUCOSE 64* 66   No results found for: CHOL, HDL, LDLCALC, TRIG No results found for: DDIMER  Radiology/Studies:  Dg Chest Portable 1 View  10/22/2015  CLINICAL DATA:  Right great toe pain. Infection. The patient has missed her last 2 dialysis appointments. EXAM: PORTABLE CHEST 1 VIEW COMPARISON:  None. FINDINGS: The heart is mildly enlarged. Atherosclerotic calcifications are present within the aortic arch. A left subclavian vein stent is in place. A right IJ dialysis catheter is in place. The tip is in the right ventricle. Mild pulmonary vascular congestion is  present without frank edema. No focal airspace consolidation is present. The visualized soft tissues and bony thorax are unremarkable. IMPRESSION: 1. Mild cardiomegaly and pulmonary vascular congestion. 2. Atherosclerosis of the aortic arch. 3. Left subclavian stent. Electronically Signed   By: Marin Robertshristopher  Mattern M.D.   On: 10/22/2015 13:54   Dg Toe Great Right  10/22/2015  CLINICAL DATA:  Right great toe pain. Toe nail removed approximately 1 week ago without healing. EXAM: RIGHT GREAT TOE COMPARISON:  None. FINDINGS: There are vague lucencies within the tuft of the distal phalanx. No fracture line or displaced fracture fragment seen. Atherosclerotic changes noted within the surrounding soft tissues. IMPRESSION: 1. Vague lucencies  within the tuft of the distal phalanx of the great toe. Based on the lateral projection, this is suspected to be due to overlying soft tissue edema. Demineralization and/or early destructive changes of osteomyelitis cannot be excluded. If clinical concern for osteomyelitis, recommend MRI for more definitive characterization. 2. Atherosclerosis. Electronically Signed   By: Bary Richard M.D.   On: 10/22/2015 13:57    EKG: Interpreted by me showed: NSR, 69 bpm, low voltage precordial leads, nonspecific st/t changes Telemetry: Interpreted by me showed: NSR 70's bpm. Short run of atrial tach  Weights: Filed Weights   10/22/15 1250 10/22/15 2134 10/23/15 0436  Weight: 135 lb (61.236 kg) 137 lb 4.8 oz (62.279 kg) 138 lb 1.6 oz (62.642 kg)     Physical Exam: Blood pressure 116/51, pulse 67, temperature 97.9 F (36.6 C), temperature source Oral, resp. rate 19, height  (1.575 m), weight 138 lb 1.6 oz (62.642 kg), SpO2 97 %. Body mass index is 25.25 kg/(m^2). General: Well developed, well nourished, in no acute distress. Head: Normocephalic, atraumatic, sclera non-icteric, no xanthomas, nares are without discharge.  Neck: Negative for carotid bruits. JVD not  elevated. Lungs: Clear bilaterally to auscultation without wheezes, rales, or rhonchi. Breathing is unlabored. Heart: RRR with S1 S2. No murmurs, rubs, or gallops appreciated. Abdomen: Soft, non-tender, non-distended with normoactive bowel sounds. No hepatomegaly. No rebound/guarding. No obvious abdominal masses. Msk:  Strength and tone appear normal for age. Extremities: No clubbing or cyanosis. No edema. Right great toe with black discoloration. Multiple ulcers along bilateral feet.  Neuro: Alert and oriented X 3. Right-sided facial droop. No focal deficit. Moves all extremities spontaneously. Psych:  Responds to questions appropriately with a normal affect.    Assessment and Plan:  Principal Problem:   Toe osteomyelitis, right (HCC) Active Problems:   ESRD (end stage renal disease) (HCC)   Hyperkalemia   Anemia of chronic disease   Osteomyelitis (HCC)    1. Elevated troponin: -Troponin is of uncertain significance in this patient that has never had any symptoms concerning for angina or SOB -It remains unclear why this troponin was even ordered, then repeated after initial was negative in an asymptomatic patient -Check echo to evaluate LVSF and wall motion  -Further recommends pending echo -Heparin gtt has been started by IM for #2, not elevated troponin. Defer to IM -Continue BB and aspirin per primary   2. Osteomyelitis: -Possibly needing amputation pending podiatry and vascular consult -Per IM -If echo is normal she should be acceptable risk for non-cardiac surgery  3. ESRD on HD: -Per renal  4. HTN: -Controlled    Signed, Eula Listen, PA-C [facility] Pager: [phone] 10/23/2015, 11:41 AM

## 2015-10-23 NOTE — Progress Notes (Signed)
Hemodialysis completed. 

## 2015-10-23 NOTE — Consult Note (Signed)
Patient Demographics  Tracy Drake, is a 70 y.o. female   MRN: [id number]   DOB - 1945-09-01  Admit Date - 10/22/2015    Outpatient Primary MD for the patient is Pcp Not In System  Consult requested in the Hospital by Alford Highland, MD, On 10/23/2015    Reason for consult wound on right great toe with chronic drainage and lack of healing.   With History of -  Past Medical History  Diagnosis Date  . Diabetes mellitus (HCC)     a. not on medications  . History of stroke   . ESRD (end stage renal disease) on dialysis (HCC)   . Essential hypertension   . HLD (hyperlipidemia)   . Diabetic neuropathy (HCC)       History reviewed. No pertinent past surgical history.  in for   Chief Complaint  Patient presents with  . Toe Pain     HPI  Tracy Drake  is a 70 y.o. female, Patient's had lack of healing and a wound to the right great toe for several months. Dr. Linus Galas treated in the office and recommended antibiotics for the patient but they neglected to start the antibiotics and never went to purchase them and she never took them. He also recommended follow-up with vascular clinic which they neglected follow up on as well. She apparently lives with her sister. She has a chronic history of diabetes and end-stage renal disease. She had history of stroke and is a poor historian and is somewhat unable to communicate very well.    Social History Social History  Substance Use Topics  . Smoking status: Never Smoker   . Smokeless tobacco: Never Used  . Alcohol Use: No     Family History Family History  Problem Relation Age of Onset  . Hypertension Mother      Prior to Admission medications   Medication Sig Start Date End Date Taking? Authorizing Provider  amLODipine (NORVASC) 2.5 MG tablet Take 2.5  mg by mouth daily.   Yes Historical Provider, MD  aspirin EC 81 MG tablet Take 81 mg by mouth daily.   Yes Historical Provider, MD  atorvastatin (LIPITOR) 20 MG tablet Take 20 mg by mouth daily at 6 PM.   Yes Historical Provider, MD  cinacalcet (SENSIPAR) 90 MG tablet Take 90 mg by mouth daily with supper.   Yes Historical Provider, MD  lisinopril (PRINIVIL,ZESTRIL) 10 MG tablet Take 10 mg by mouth daily.   Yes Historical Provider, MD  sevelamer carbonate (RENVELA) 800 MG tablet Take 1,600 mg by mouth 3 (three) times daily with meals.   Yes Historical Provider, MD  venlafaxine XR (EFFEXOR-XR) 37.5 MG 24 hr capsule Take 37.5 mg by mouth daily with breakfast.   Yes Historical Provider, MD    Anti-infectives    Start     Dose/Rate Route Frequency Ordered Stop   10/23/15 1200  vancomycin (VANCOCIN) IVPB 750 mg/150 ml premix     750 mg 150 mL/hr over 60 Minutes Intravenous Every T-Th-Sa (Hemodialysis) 10/23/15 1104     10/23/15 0800  vancomycin (VANCOCIN) IVPB 750 mg/150 ml premix     750 mg 150 mL/hr over 60 Minutes Intravenous  Once 10/23/15 0741 10/23/15 1158   10/22/15 2200  piperacillin-tazobactam (ZOSYN) IVPB 3.375 g     3.375 g 12.5 mL/hr over 240 Minutes Intravenous Every 12 hours 10/22/15 2048     10/22/15 1445  vancomycin (VANCOCIN) IVPB 1000 mg/200 mL premix     1,000 mg 200 mL/hr over 60 Minutes Intravenous  Once 10/22/15 1442 10/22/15 1729   10/22/15 1445  piperacillin-tazobactam (ZOSYN) IVPB 3.375 g     3.375 g 100 mL/hr over 30 Minutes Intravenous  Once 10/22/15 1444 10/22/15 1650      Scheduled Meds: . amLODipine  2.5 mg Oral Daily  . aspirin EC  81 mg Oral Daily  . atorvastatin  20 mg Oral q1800  . calcium gluconate 1 GM IV  1 g Intravenous Once  . cinacalcet  90 mg Oral Q supper  . docusate sodium  100 mg Oral BID  . heparin subcutaneous  5,000 Units Subcutaneous Q8H  . lisinopril  10 mg Oral Daily  . metoCLOPramide (REGLAN) injection  5 mg Intravenous Once  .  metoprolol tartrate  12.5 mg Oral BID  . piperacillin-tazobactam (ZOSYN)  IV  3.375 g Intravenous Q12H  . senna  1 tablet Oral BID  . sevelamer carbonate  1,600 mg Oral TID WC  . sodium chloride flush  3 mL Intravenous Q12H  . sodium chloride flush  3 mL Intravenous Q12H  . vancomycin  750 mg Intravenous Q T,Th,Sa-HD  . venlafaxine XR  37.5 mg Oral Q breakfast   Continuous Infusions:  PRN Meds:.sodium chloride, acetaminophen **OR** acetaminophen, HYDROcodone-acetaminophen, morphine injection, ondansetron **OR** ondansetron (ZOFRAN) IV, polyethylene glycol, sodium chloride flush  No Known Allergies  Physical Exam  Vitals  Blood pressure 151/55, pulse 59, temperature 97.9 F (36.6 C), temperature source Oral, resp. rate 20, height 5\' 2"  (1.575 m), weight 62.642 kg (138 lb 1.6 oz), SpO2 100 %.  Lower Extremity exam:  Vascular :DP and PT pulses are nonpalpable. She's been evaluated by Dr. dew from vascular disease and is scheduled for an angioplasty tomorrow.  Derm: Right hallux nail has abnormal nail growth and has a buildup of tissue ends also very painful to the right lateral border of the nail area.  Neuro:Patient does have pain in that area and apparently is sensate to the toes.   Ortho: No gross deformities question as to whether there is any osteomyelitis to the area.   Data Review  CBC  Recent Labs Lab 10/22/15 1240 10/23/15 1118  WBC 4.8 4.3  HGB 11.8* 11.6*  HCT 35.7 34.0*  PLT 174 167  MCV 99.9 97.6  MCH 33.0 33.4  MCHC 33.0 34.2  RDW 13.9 13.8   ------------------------------------------------------------------------------------------------------------------  Chemistries   Recent Labs Lab 10/22/15 1240 10/23/15 0655 10/23/15 1118  NA 138 139 139  K 7.1* 5.1 4.7  CL 111 106 103  CO2 10* 20* 24  GLUCOSE 64* 66 87  BUN 143* 69* 73*  CREATININE 14.15* 9.01* 9.51*  CALCIUM 9.7 8.8* 9.0  AST 13*  --   --   ALT 12*  --   --   ALKPHOS 148*  --   --    BILITOT 0.7  --   --    ------------------------------------------------------------------------------------------------------------------ estimated creatinine clearance is 4.7 mL/min (by C-G formula based on Cr of 9.51). ------------------------------------------------------------------------------------------------------------------  ---------------------------------------------------------------------------------------------------------------  Imaging results:   Dg Chest Portable 1 View  10/22/2015  CLINICAL DATA:  Right great toe pain. Infection. The patient has missed her last 2 dialysis appointments.  EXAM: PORTABLE CHEST 1 VIEW COMPARISON:  None. FINDINGS: The heart is mildly enlarged. Atherosclerotic calcifications are present within the aortic arch. A left subclavian vein stent is in place. A right IJ dialysis catheter is in place. The tip is in the right ventricle. Mild pulmonary vascular congestion is present without frank edema. No focal airspace consolidation is present. The visualized soft tissues and bony thorax are unremarkable. IMPRESSION: 1. Mild cardiomegaly and pulmonary vascular congestion. 2. Atherosclerosis of the aortic arch. 3. Left subclavian stent. Electronically Signed   By: Marin Roberts M.D.   On: 10/22/2015 13:54   Dg Toe Great Right  10/22/2015  CLINICAL DATA:  Right great toe pain. Toe nail removed approximately 1 week ago without healing. EXAM: RIGHT GREAT TOE COMPARISON:  None. FINDINGS: There are vague lucencies within the tuft of the distal phalanx. No fracture line or displaced fracture fragment seen. Atherosclerotic changes noted within the surrounding soft tissues. IMPRESSION: 1. Vague lucencies within the tuft of the distal phalanx of the great toe. Based on the lateral projection, this is suspected to be due to overlying soft tissue edema. Demineralization and/or early destructive changes of osteomyelitis cannot be excluded. If clinical concern for  osteomyelitis, recommend MRI for more definitive characterization. 2. Atherosclerosis. Electronically Signed   By: Bary Richard M.D.   On: 10/22/2015 13:57     Assessment & Plan: Patient has end-stage renal disease has had a history of a stroke and is a poor historian. Concerns for possible osteomyelitis to the right great toe distal phalanx but there is no definitive evidence of that on regular x-ray. MRI does not want to perform the MRI until x-rays prove there is no evidence of metal in the body consistent with a pacemaker. She is scheduled to have an angioplasty tomorrow by Dr. dew. She is just received a dialysis treatment. There is a chronic necrotic area on the lateral aspect of the right great toe with an uncertain level of tissue damage below that. Plan: We'll evaluate her after she has the angioplasty and see if some of her pain is better and the toe will also need debridement of that area and if osteomyelitis is present may have to consider a distal toe amputation. Difficult to say what approach would be best at this point until I get further information from both angioplasty and possible improving circulation and also hopefully can get an MRI later on if x-rays are negative for a pacemaker.  Principal Problem:   Toe osteomyelitis, right (HCC) Active Problems:   ESRD (end stage renal disease) (HCC)   Hyperkalemia   Anemia of chronic disease   Osteomyelitis (HCC)     Family Communication: Plan discussed with patient. The nurse was present when I talked to her the nurse asked if she is able to make her own medical decisions and she said and indicated that she didn't make her own medical decisions.   Epimenio Sarin M.D on 10/23/2015 at 5:38 PM  Thank you for the consult, we will follow the patient with you in the Hospital.

## 2015-10-23 NOTE — Consult Note (Signed)
[facility] Vascular Consult Note  MRN : [id number]  Tracy Drake is a 70 y.o. ([birth year]) female who presents with chief complaint of  Chief Complaint  Patient presents with  . Toe Pain  .  History of Present Illness: I am asked by Dr. Renae Gloss and Dr. Orland Jarred to see the patient regarding toe pain and skin changes.She has ESRD, DM, and a litany of risk factors for PAD and this toe has been hurting for weeks.  NO trauma or injury.  She has had no previous surgery to improve circulation in her legs.  She has a history of stroke, and does not walk much at this point.  She has skin color changes of the toe and pain.  No fever or chills.    Current Facility-Administered Medications  Medication Dose Route Frequency Provider Last Rate Last Dose  . 0.9 %  sodium chloride infusion  250 mL Intravenous PRN Katharina Caper, MD      . acetaminophen (TYLENOL) tablet 650 mg  650 mg Oral Q6H PRN Katharina Caper, MD       Or  . acetaminophen (TYLENOL) suppository 650 mg  650 mg Rectal Q6H PRN Katharina Caper, MD      . amLODipine (NORVASC) tablet 2.5 mg  2.5 mg Oral Daily Katharina Caper, MD   2.5 mg at 10/22/15 1623  . aspirin EC tablet 81 mg  81 mg Oral Daily Katharina Caper, MD   81 mg at 10/23/15 1330  . atorvastatin (LIPITOR) tablet 20 mg  20 mg Oral q1800 Katharina Caper, MD      . calcium gluconate 1 g in sodium chloride 0.9 % 100 mL IVPB  1 g Intravenous Once Jene Every, MD      . cinacalcet (SENSIPAR) tablet 90 mg  90 mg Oral Q supper Katharina Caper, MD      . docusate sodium (COLACE) capsule 100 mg  100 mg Oral BID Katharina Caper, MD   100 mg at 10/23/15 1335  . heparin injection 5,000 Units  5,000 Units Subcutaneous Q8H Richard Renae Gloss, MD      . HYDROcodone-acetaminophen (NORCO/VICODIN) 5-325 MG per tablet 1-2 tablet  1-2 tablet Oral Q4H PRN Katharina Caper, MD      . lisinopril (PRINIVIL,ZESTRIL) tablet 10 mg  10 mg Oral Daily Katharina Caper, MD   10 mg at 10/22/15 1623  .  metoCLOPramide (REGLAN) injection 5 mg  5 mg Intravenous Once Alford Highland, MD      . metoprolol tartrate (LOPRESSOR) tablet 12.5 mg  12.5 mg Oral BID Katharina Caper, MD   12.5 mg at 10/22/15 1633  . morphine 2 MG/ML injection 2 mg  2 mg Intravenous Q3H PRN Katharina Caper, MD   2 mg at 10/23/15 0444  . ondansetron (ZOFRAN) tablet 4 mg  4 mg Oral Q6H PRN Katharina Caper, MD       Or  . ondansetron (ZOFRAN) injection 4 mg  4 mg Intravenous Q6H PRN Katharina Caper, MD      . piperacillin-tazobactam (ZOSYN) IVPB 3.375 g  3.375 g Intravenous Q12H Sheema M Hallaji, RPH   3.375 g at 10/23/15 1100  . polyethylene glycol (MIRALAX / GLYCOLAX) packet 17 g  17 g Oral Daily PRN Katharina Caper, MD      . senna (SENOKOT) tablet 8.6 mg  1 tablet Oral BID Katharina Caper, MD   8.6 mg at 10/23/15 1330  . sevelamer carbonate (RENVELA) tablet 1,600 mg  1,600 mg Oral TID WC Rima Vaickute,  MD   1,600 mg at 10/23/15 1330  . sodium chloride flush (NS) 0.9 % injection 3 mL  3 mL Intravenous Q12H Katharina Caperima Vaickute, MD   3 mL at 10/23/15 1336  . sodium chloride flush (NS) 0.9 % injection 3 mL  3 mL Intravenous Q12H Katharina Caperima Vaickute, MD   3 mL at 10/23/15 1059  . sodium chloride flush (NS) 0.9 % injection 3 mL  3 mL Intravenous PRN Katharina Caperima Vaickute, MD      . vancomycin (VANCOCIN) IVPB 750 mg/150 ml premix  750 mg Intravenous Q T,Th,Sa-HD Alford Highlandichard Wieting, MD   0 mg at 10/23/15 1141  . venlafaxine XR (EFFEXOR-XR) 24 hr capsule 37.5 mg  37.5 mg Oral Q breakfast Katharina Caperima Vaickute, MD   37.5 mg at 10/23/15 0800    Past Medical History  Diagnosis Date  . Diabetes mellitus (HCC)     a. not on medications  . History of stroke   . ESRD (end stage renal disease) on dialysis (HCC)   . Essential hypertension   . HLD (hyperlipidemia)   . Diabetic neuropathy (HCC)     History reviewed. No pertinent past surgical history.  Social History Social History  Substance Use Topics  . Smoking status: Never Smoker   . Smokeless tobacco: Never Used  .  Alcohol Use: No  No IVDU  Family History Family History  Problem Relation Age of Onset  . Hypertension Mother   No bleeding or clotting disorders, no aneurysms  No Known Allergies   REVIEW OF SYSTEMS (Negative unless checked)  Constitutional: [] Weight loss  [] Fever  [] Chills Cardiac: [] Chest pain   [] Chest pressure   [] Palpitations   [] Shortness of breath when laying flat   [] Shortness of breath at rest   [] Shortness of breath with exertion. Vascular:  [] Pain in legs with walking   [] Pain in legs at rest   [] Pain in legs when laying flat   [] Claudication   [] Pain in feet when walking  [x] Pain in feet at rest  [] Pain in feet when laying flat   [] History of DVT   [] Phlebitis   [] Swelling in legs   [] Varicose veins   [x] Non-healing ulcers Pulmonary:   [] Uses home oxygen   [] Productive cough   [] Hemoptysis   [] Wheeze  [] COPD   [] Asthma Neurologic:  [] Dizziness  [] Blackouts   [] Seizures   [] History of stroke   [] History of TIA  [] Aphasia   [] Temporary blindness   [] Dysphagia   [] Weakness or numbness in arms   [] Weakness or numbness in legs Musculoskeletal:  [] Arthritis   [] Joint swelling   [] Joint pain   [] Low back pain Hematologic:  [] Easy bruising  [] Easy bleeding   [] Hypercoagulable state   [] Anemic  [] Hepatitis Gastrointestinal:  [] Blood in stool   [] Vomiting blood  [] Gastroesophageal reflux/heartburn   [] Difficulty swallowing. Genitourinary:  [x] Chronic kidney disease   [] Difficult urination  [] Frequent urination  [] Burning with urination   [] Blood in urine Skin:  [] Rashes   [x] Ulcers   [x] Wounds Psychological:  [] History of anxiety   []  History of major depression.  Physical Examination  Filed Vitals:   10/23/15 0412 10/23/15 0436 10/23/15 0741 10/23/15 1221  BP: 123/58  116/51 105/46  Pulse: 73  67 77  Temp: 98.2 F (36.8 C)  97.9 F (36.6 C) 97.7 F (36.5 C)  TempSrc: Oral  Oral Oral  Resp: 19   18  Height:      Weight:  62.642 kg (138 lb 1.6 oz)    SpO2: 98%  97% 100%    Body mass index is 25.25 kg/(m^2). Gen:  WD/WN, NAD Head: [place]/AT, No temporalis wasting. Prominent temp pulse not noted. Ear/Nose/Throat: Hearing grossly intact, nares w/o erythema or drainage, oropharynx w/o Erythema/Exudate Eyes: PERRLA, EOMI.  Neck: Supple, no nuchal rigidity.  No JVD.  Pulmonary:  Good air movement, equal bilaterally.  Cardiac: RRR, normal S1, S2 Vascular:  Vessel Right Left  Radial Palpable Palpable  Ulnar Palpable Palpable  Brachial Palpable Palpable  Carotid Palpable, without bruit Palpable, without bruit  Aorta Not palpable N/A  Femoral Palpable Palpable  Popliteal Not Palpable Not Palpable  PT Weakly Palpable Palpable  DP Not Palpable Not Palpable   Gastrointestinal: soft, non-tender/non-distended. No guarding/reflex. No masses, surgical incisions, or scars. Musculoskeletal: ulcer on great toe with black tissue around it.  Some contractures present in the legs. No edema. Neurologic: CN 2-12 intact. Pain and light touch intact in extremities.  Symmetrical.  Speech is fluent.  Psychiatric: Judgment intact, Mood & affect appropriate for pt's clinical situation. Dermatologic: right great toe with ulcer beside nail and black, thickened tissue beside the toenail. Lymph : No Cervical, Axillary, or Inguinal lymphadenopathy.      CBC Lab Results  Component Value Date   WBC 4.3 10/23/2015   HGB 11.6* 10/23/2015   HCT 34.0* 10/23/2015   MCV 97.6 10/23/2015   PLT 167 10/23/2015    BMET    Component Value Date/Time   NA 139 10/23/2015 1118   K 4.7 10/23/2015 1118   CL 103 10/23/2015 1118   CO2 24 10/23/2015 1118   GLUCOSE 87 10/23/2015 1118   BUN 73* 10/23/2015 1118   CREATININE 9.51* 10/23/2015 1118   CALCIUM 9.0 10/23/2015 1118   GFRNONAA 4* 10/23/2015 1118   GFRAA 4* 10/23/2015 1118   Estimated Creatinine Clearance: 4.7 mL/min (by C-G formula based on Cr of 9.51).  COAG Lab Results  Component Value Date   INR 1.11 10/23/2015     Radiology Dg Chest Portable 1 View  10/22/2015  CLINICAL DATA:  Right great toe pain. Infection. The patient has missed her last 2 dialysis appointments. EXAM: PORTABLE CHEST 1 VIEW COMPARISON:  None. FINDINGS: The heart is mildly enlarged. Atherosclerotic calcifications are present within the aortic arch. A left subclavian vein stent is in place. A right IJ dialysis catheter is in place. The tip is in the right ventricle. Mild pulmonary vascular congestion is present without frank edema. No focal airspace consolidation is present. The visualized soft tissues and bony thorax are unremarkable. IMPRESSION: 1. Mild cardiomegaly and pulmonary vascular congestion. 2. Atherosclerosis of the aortic arch. 3. Left subclavian stent. Electronically Signed   By: Marin Roberts M.D.   On: 10/22/2015 13:54   Dg Toe Great Right  10/22/2015  CLINICAL DATA:  Right great toe pain. Toe nail removed approximately 1 week ago without healing. EXAM: RIGHT GREAT TOE COMPARISON:  None. FINDINGS: There are vague lucencies within the tuft of the distal phalanx. No fracture line or displaced fracture fragment seen. Atherosclerotic changes noted within the surrounding soft tissues. IMPRESSION: 1. Vague lucencies within the tuft of the distal phalanx of the great toe. Based on the lateral projection, this is suspected to be due to overlying soft tissue edema. Demineralization and/or early destructive changes of osteomyelitis cannot be excluded. If clinical concern for osteomyelitis, recommend MRI for more definitive characterization. 2. Atherosclerosis. Electronically Signed   By: Bary Richard M.D.   On: 10/22/2015 13:57      Assessment/Plan 1. Ulceration,  early gangrenous changes to right great toe.  Would recommend angiogram with possible revascularization tomorrow.  Given her ESRD and limited wound healing, this is clearly a limb threatening situation 2. ESRD. Got HD for increased K yesterday. 3. HTN. On outpatient  medications.   4. History of stroke.  May be contributing to contractures in legs which increase risk of pressure sores 5. DM. Important to control sugars to promote wound healing.  DEW,JASON, MD  10/23/2015 1:55 PM

## 2015-10-23 NOTE — Progress Notes (Signed)
Patient experienced asymptomatic hypotension during dialysis treatment,corrected with uf goal decrease,Dr.Kolluru made aware.Patient blood pressure post HD=150/55,patient with no complaints.CVC lumens wrapped with gauze/taped.

## 2015-10-23 NOTE — Progress Notes (Signed)
[facility]  ROUNDING NOTE   Subjective:   Patient had emergent hemodialysis yesterday due to hyperkalemia, metabolic acidosis and uremia. She tolerated treatment well. UF of 1.6 litres.   Complains of right toe pain.     Objective:  Vital signs in last 24 hours:  Temp:  [97.4 F (36.3 C)-98.3 F (36.8 C)] 97.9 F (36.6 C) (07/11 0741) Pulse Rate:  [60-82] 67 (07/11 0741) Resp:  [6-27] 19 (07/11 0412) BP: (68-185)/(51-103) 116/51 mmHg (07/11 0741) SpO2:  [94 %-100 %] 97 % (07/11 0741) FiO2 (%):  [21 %] 21 % (07/10 2049) Weight:  [61.236 kg (135 lb)-62.642 kg (138 lb 1.6 oz)] 62.642 kg (138 lb 1.6 oz) (07/11 0436)  Weight change:  Filed Weights   10/22/15 1250 10/22/15 2134 10/23/15 0436  Weight: 61.236 kg (135 lb) 62.279 kg (137 lb 4.8 oz) 62.642 kg (138 lb 1.6 oz)    Intake/Output: I/O last 3 completed shifts: In: 67 [IV Piggyback:50] Out: 1610 [Other:1610]   Intake/Output this shift:     Physical Exam: General: NAD, laying in bed  Head: +hard of hearing  Eyes: Anicteric, PERRL  Neck: Supple, trachea midline  Lungs:  Clear to auscultation  Heart: Regular rate and rhythm  Abdomen:  Soft, nontender,   Extremities: Right toe with ischemic changes  Neurologic: Oriented to self only  Skin: No lesions  Access: RIJ permcath    Basic Metabolic Panel:  Recent Labs Lab 10/22/15 1240 10/23/15 0655  NA 138 139  K 7.1* 5.1  CL 111 106  CO2 10* 20*  GLUCOSE 64* 66  BUN 143* 69*  CREATININE 14.15* 9.01*  CALCIUM 9.7 8.8*  PHOS  --  4.8*    Liver Function Tests:  Recent Labs Lab 10/22/15 1240 10/23/15 0655  AST 13*  --   ALT 12*  --   ALKPHOS 148*  --   BILITOT 0.7  --   PROT 7.0  --   ALBUMIN 3.4* 3.1*   No results for input(s): LIPASE, AMYLASE in the last 168 hours. No results for input(s): AMMONIA in the last 168 hours.  CBC:  Recent Labs Lab 10/22/15 1240  WBC 4.8  HGB 11.8*  HCT 35.7  MCV 99.9  PLT 174    Cardiac  Enzymes:  Recent Labs Lab 10/22/15 2145 10/23/15 0655  TROPONINI <0.03 1.27*    BNP: Invalid input(s): POCBNP  CBG:  Recent Labs Lab 10/22/15 1502 10/22/15 2127 10/22/15 2223 10/22/15 2333 10/23/15 0751  GLUCAP 144* 38* 56* 138* 77    Microbiology: Results for orders placed or performed during the hospital encounter of 10/22/15  MRSA PCR Screening     Status: None   Collection Time: 10/22/15 10:09 PM  Result Value Ref Range Status   MRSA by PCR NEGATIVE NEGATIVE Final    Comment:        The GeneXpert MRSA Assay (FDA approved for NASAL specimens only), is one component of a comprehensive MRSA colonization surveillance program. It is not intended to diagnose MRSA infection nor to guide or monitor treatment for MRSA infections.     Coagulation Studies: No results for input(s): LABPROT, INR in the last 72 hours.  Urinalysis: No results for input(s): COLORURINE, LABSPEC, PHURINE, GLUCOSEU, HGBUR, BILIRUBINUR, KETONESUR, PROTEINUR, UROBILINOGEN, NITRITE, LEUKOCYTESUR in the last 72 hours.  Invalid input(s): APPERANCEUR    Imaging: Dg Chest Portable 1 View  10/22/2015  CLINICAL DATA:  Right great toe pain. Infection. The patient has missed her last 2 dialysis appointments.  EXAM: PORTABLE CHEST 1 VIEW COMPARISON:  None. FINDINGS: The heart is mildly enlarged. Atherosclerotic calcifications are present within the aortic arch. A left subclavian vein stent is in place. A right IJ dialysis catheter is in place. The tip is in the right ventricle. Mild pulmonary vascular congestion is present without frank edema. No focal airspace consolidation is present. The visualized soft tissues and bony thorax are unremarkable. IMPRESSION: 1. Mild cardiomegaly and pulmonary vascular congestion. 2. Atherosclerosis of the aortic arch. 3. Left subclavian stent. Electronically Signed   By: Christopher  Mattern M.D.   On: 10/22/2015 13:54   Dg Toe Great [place] Robertsight  10/22/2015  CLINICAL DATA:   Right great toe pain. Toe nail removed approximately 1 week ago without healing. EXAM: RIGHT GREAT TOE COMPARISON:  None. FINDINGS: There are vague lucencies within the tuft of the distal phalanx. No fracture line or displaced fracture fragment seen. Atherosclerotic changes noted within the surrounding soft tissues. IMPRESSION: 1. Vague lucencies within the tuft of the distal phalanx of the great toe. Based on the lateral projection, this is suspected to be due to overlying soft tissue edema. Demineralization and/or early destructive changes of osteomyelitis cannot be excluded. If clinical concern for osteomyelitis, recommend MRI for more definitive characterization. 2. Atherosclerosis. Electronically Signed   By: Bary RichardStan  Maynard M.D.   On: 10/22/2015 13:57     Medications:     . amLODipine  2.5 mg Oral Daily  . aspirin EC  81 mg Oral Daily  . atorvastatin  20 mg Oral q1800  . calcium gluconate 1 GM IV  1 g Intravenous Once  . cinacalcet  90 mg Oral Q supper  . docusate sodium  100 mg Oral BID  . lisinopril  10 mg Oral Daily  . metoprolol tartrate  12.5 mg Oral BID  . piperacillin-tazobactam (ZOSYN)  IV  3.375 g Intravenous Q12H  . senna  1 tablet Oral BID  . sevelamer carbonate  1,600 mg Oral TID WC  . sodium chloride flush  3 mL Intravenous Q12H  . sodium chloride flush  3 mL Intravenous Q12H  . vancomycin  750 mg Intravenous Once  . vancomycin  750 mg Intravenous Q T,Th,Sa-HD  . venlafaxine XR  37.5 mg Oral Q breakfast     Assessment/ Plan:  [name] is a 70 y.o. black female with hypertension, diabetes mellitus type II, hyperlipidemia, thyroid disorder and ESRD on hemodialysis. Pt recently moved from [place] via [place]. Now staying with her sister.   [facility] [address]  RIJ permcath  1. End Stage Renal Disease with hyperkalemia: requiring emergent hemodialysis yesterday.  - Schedule patient for dialysis for later today. Orders prepared.   2.  Hypertension: at goal.  - home regimen of amlodipine, lisinopril  3. Secondary Hyperparathyroidism: outpatient PTH >1360. Phos and calcium at goal.  - continue sevelamer for phos binding - cinacalcet  4. Anemia of chronic kidney disease:  - no acute indication for epo especially with concern for ischemic leg.   5. Peripheral Vascular disease: with pain of his right toe.  - Consult vascular   LOS: 1 Lundy Cozart 7/11/201711:06 AM

## 2015-10-23 NOTE — Care Management Note (Signed)
[facility] [address] [facility].  I have sent admission records and will follow up with additional records at discharge.  Ivor ReiningKim Tiffanie Blassingame Dialysis Coordinator  [phone]

## 2015-10-23 NOTE — Progress Notes (Signed)
   Third troponin negative at <0.03. Question possible lab error on the second troponin, as if this was a true elevation of troponin there should still be a mild elevation even if the level was trending in this patient with ESRD on HD. Not consistent with MI. She did not undergo dialysis in between lab draws. The elevated troponin was drawn at 6:55 AM and the third, negative troponin, was drawn at 11:18 AM. She did not start dialysis until 2:30 PM.

## 2015-10-23 NOTE — Progress Notes (Addendum)
Have made multiple attempts today to contact family. Phone number for pt's sister provided in chart, pt is unable to remember any contact info for sister or daughter. MRI requests clearance, pt is poor historian, unable to obtain further information. Per MRI, pt will need xrays to confirm clearance for MRI procedure (head, chest, abd). MD notified, and will  continue to attempt contact with family.

## 2015-10-23 NOTE — Progress Notes (Signed)
Unable to obtain consent, unable to contact family. Pt states her sister helps make her medical decisions. Reported to oncoming shift. Please refer to case management tomorrow.

## 2015-10-23 NOTE — Progress Notes (Signed)
MD notified of patient's low CBG of 38. Patient alert, given juice to drink and sandwich tray. MD stated to allow patient to eat since she has not had much to eat today, and to recheck CBG Q1hour until CBG>100. Nursing continues to monitor.

## 2015-10-23 NOTE — Progress Notes (Signed)
Pharmacy Antibiotic Note  Tracy CarrowLouvenia Drake is a 70 y.o. female admitted on 10/22/2015 with possible Osteomyelitis of right great toe.  Pharmacy has been consulted for vancomycin and Zosyn dosing. Patient had PMH of ESRD w/HD and DM, and has missed two days of dialysis. HD usually TTS.  Plan: Patient received Vancomycin 1gm IV X1 and Zosyn 3.375 IV x1 in ED.  Pt had HD yesterday evening with no maintenance vanc dose ordered. Will order vancomycin 750 mg IV x1 now to give on the floor, spoke with RN about plan.  Will need to f/u up with patient's HD schedule while in hospital and schedule vancomycin with HD.   Will start patient on Zosyn 3.375 IV EI every 12 hours.   Height: 5\' 2"  (157.5 cm) Weight: 138 lb 1.6 oz (62.642 kg) IBW/kg (Calculated) : 50.1  Temp (24hrs), Avg:97.8 F (36.6 C), Min:97.4 F (36.3 C), Max:98.3 F (36.8 C)   Recent Labs Lab 10/22/15 1240  WBC 4.8  CREATININE 14.15*    Estimated Creatinine Clearance: 3.2 mL/min (by C-G formula based on Cr of 14.15).    No Known Allergies  Antimicrobials this admission: 7/10 Vancomcyin >>  7/10 Zosyn >>    Microbiology results: 7/10 BCx: pending  Thank you for allowing pharmacy to be a part of this patient's care.  Crist FatHannah Tacora Athanas, PharmD, BCPS Clinical Pharmacist 10/23/2015 8:22 AM

## 2015-10-23 NOTE — Progress Notes (Signed)
Pharmacy Antibiotic Note  Tracy CarrowLouvenia Drake is a 70 y.o. female admitted on 10/22/2015 with possible Osteomyelitis of right great toe.  Pharmacy has been consulted for vancomycin and Zosyn dosing. Patient had PMH of ESRD w/HD and DM, and has missed two days of dialysis. HD usually TTS.  Plan: Patient received Vancomycin 1gm IV X1 and Zosyn 3.375 IV x1 in ED.   Pt had HD yesterday evening with no maintenance vanc dose ordered. Will order vancomycin 750 mg IV x1 now to give on the floor, spoke with RN about plan. (Estimate that pt's loading dose of vancomycin should be ~1400 mg).   Renal MD planning another HD session later today (usual schedule of TTS). Will order vancomycin 750 mg IV with each HD session scheduled for TTS. Vancomycin level before 3rd HD session, ordered for 7/13. Will need to f/u up with patient's HD schedule while in hospital and ensure pt is receiving vancomycin with HD.     Will start patient on Zosyn 3.375 IV EI every 12 hours.   Height: 5\' 2"  (157.5 cm) Weight: 138 lb 1.6 oz (62.642 kg) IBW/kg (Calculated) : 50.1  Temp (24hrs), Avg:97.8 F (36.6 C), Min:97.4 F (36.3 C), Max:98.3 F (36.8 C)   Recent Labs Lab 10/22/15 1240 10/23/15 0655  WBC 4.8  --   CREATININE 14.15* 9.01*    Estimated Creatinine Clearance: 5 mL/min (by C-G formula based on Cr of 9.01).    No Known Allergies  Antimicrobials this admission: 7/10 Vancomcyin >>  7/10 Zosyn >>    Microbiology results: 7/10 BCx: pending  Thank you for allowing pharmacy to be a part of this patient's care.  Crist FatHannah Annasophia Crocker, PharmD, BCPS Clinical Pharmacist 10/23/2015 11:21 AM

## 2015-10-24 ENCOUNTER — Other Ambulatory Visit: Payer: Federal, State, Local not specified - PPO

## 2015-10-24 ENCOUNTER — Encounter
Admission: EM | Disposition: A | Discharge status: Skilled Nursing Facility | Payer: Self-pay | Source: Home / Self Care | Attending: Internal Medicine

## 2015-10-24 DIAGNOSIS — L8990 Pressure ulcer of unspecified site, unspecified stage: Secondary | ICD-10-CM | POA: Insufficient documentation

## 2015-10-24 HISTORY — PX: PERIPHERAL VASCULAR CATHETERIZATION: SHX172C

## 2015-10-24 LAB — CBC
HCT: 33.1 % — ABNORMAL LOW (ref 35.0–47.0)
Hemoglobin: 11.3 g/dL — ABNORMAL LOW (ref 12.0–16.0)
MCH: 33 pg (ref 26.0–34.0)
MCHC: 34 g/dL (ref 32.0–36.0)
MCV: 97 fL (ref 80.0–100.0)
PLATELETS: 128 10*3/uL — AB (ref 150–440)
RBC: 3.41 MIL/uL — ABNORMAL LOW (ref 3.80–5.20)
RDW: 13.3 % (ref 11.5–14.5)
WBC: 4.5 10*3/uL (ref 3.6–11.0)

## 2015-10-24 LAB — GLUCOSE, CAPILLARY
GLUCOSE-CAPILLARY: 80 mg/dL (ref 65–99)
Glucose-Capillary: 74 mg/dL (ref 65–99)
Glucose-Capillary: 84 mg/dL (ref 65–99)

## 2015-10-24 LAB — ECHOCARDIOGRAM COMPLETE
Height: 62 in
Weight: 2209.61 oz

## 2015-10-24 SURGERY — LOWER EXTREMITY ANGIOGRAPHY
Anesthesia: Moderate Sedation | Laterality: Right

## 2015-10-24 MED ORDER — FENTANYL CITRATE (PF) 100 MCG/2ML IJ SOLN
INTRAMUSCULAR | Status: AC
  Filled 2015-10-24: qty 2

## 2015-10-24 MED ORDER — HEPARIN SODIUM (PORCINE) 1000 UNIT/ML IJ SOLN
INTRAMUSCULAR | Status: DC | PRN
  Administered 2015-10-24: 4000 [IU] via INTRAVENOUS

## 2015-10-24 MED ORDER — FENTANYL CITRATE (PF) 100 MCG/2ML IJ SOLN
INTRAMUSCULAR | Status: DC | PRN
  Administered 2015-10-24 (×3): 50 ug via INTRAVENOUS

## 2015-10-24 MED ORDER — MIDAZOLAM HCL 5 MG/5ML IJ SOLN
INTRAMUSCULAR | Status: AC
  Filled 2015-10-24: qty 5

## 2015-10-24 MED ORDER — IOPAMIDOL (ISOVUE-300) INJECTION 61%
INTRAVENOUS | Status: DC | PRN
  Administered 2015-10-24: 90 mL via INTRAVENOUS

## 2015-10-24 MED ORDER — MIDAZOLAM HCL 2 MG/2ML IJ SOLN
INTRAMUSCULAR | Status: DC | PRN
  Administered 2015-10-24 (×2): 2 mg via INTRAVENOUS
  Administered 2015-10-24 (×2): 1 mg via INTRAVENOUS

## 2015-10-24 MED ORDER — HEPARIN SODIUM (PORCINE) 1000 UNIT/ML IJ SOLN
INTRAMUSCULAR | Status: AC
  Filled 2015-10-24: qty 1

## 2015-10-24 MED ORDER — LIDOCAINE-EPINEPHRINE (PF) 1 %-1:200000 IJ SOLN
INTRAMUSCULAR | Status: AC
  Filled 2015-10-24: qty 30

## 2015-10-24 SURGICAL SUPPLY — 13 items
CATH CXI 4F 90 DAV (CATHETERS) ×3 IMPLANT
CATH KA2 5FR 65CM (CATHETERS) ×3 IMPLANT
CATH PIG 70CM (CATHETERS) ×3 IMPLANT
CATH VERT 100CM (CATHETERS) ×3 IMPLANT
DEVICE STARCLOSE SE CLOSURE (Vascular Products) ×3 IMPLANT
GLIDEWIRE ADV .035X260CM (WIRE) ×3 IMPLANT
KIT CATH CVC 3 LUMEN 7FR 8IN (MISCELLANEOUS) ×3 IMPLANT
PACK ANGIOGRAPHY (CUSTOM PROCEDURE TRAY) ×3 IMPLANT
SHEATH ANL2 6FRX45 HC (SHEATH) ×3 IMPLANT
SHEATH BRITE TIP 5FRX11 (SHEATH) ×3 IMPLANT
SYR MEDRAD MARK V 150ML (SYRINGE) ×3 IMPLANT
TUBING CONTRAST HIGH PRESS 72 (TUBING) ×3 IMPLANT
WIRE J 3MM .035X145CM (WIRE) ×3 IMPLANT

## 2015-10-24 NOTE — NC FL2 (Signed)
[place] MEDICAID FL2 LEVEL OF CARE SCREENING TOOL     IDENTIFICATION  Patient Name: Tracy Drake Birthdate: [birth year] Sex: female Admission Date (Current Location): 10/22/2015  [place] and [place] Number:  Chiropodist and Address:  [facility], [address] [place], [place] [address]      Provider Number: [id number]  Attending Physician Name and Address:  Katharina Caper, MD  Relative Name and Phone Number:       Current Level of Care: Hospital Recommended Level of Care: Skilled Nursing Facility Prior Approval Number:    Date Approved/Denied:   PASRR Number:  [id number])  Discharge Plan: SNF    Current Diagnoses: Patient Active Problem List   Diagnosis Date Noted  . Pressure ulcer 10/24/2015  . ESRD (end stage renal disease) (HCC) 10/22/2015  . Hyperkalemia 10/22/2015  . Toe osteomyelitis, right (HCC) 10/22/2015  . Anemia of chronic disease 10/22/2015  . Osteomyelitis (HCC) 10/22/2015  Dialysis   Orientation RESPIRATION BLADDER Height & Weight     Self, Time, Place  Normal Incontinent Weight: 138 lb 1.6 oz (62.642 kg) Height:   (157.5 cm)  BEHAVIORAL SYMPTOMS/MOOD NEUROLOGICAL BOWEL NUTRITION STATUS   (none )  (none ) Incontinent Diet (Diet: NPO for surgery. )  AMBULATORY STATUS COMMUNICATION OF NEEDS Skin   Extensive Assist Verbally PU Stage and Appropriate Care (Pressure Ulcer Stage 2: Left Hip. )                       Personal Care Assistance Level of Assistance  Bathing, Feeding, Dressing Bathing Assistance: Limited assistance Feeding assistance: Independent Dressing Assistance: Limited assistance     Functional Limitations Info  Sight, Hearing, Speech Sight Info: Adequate Hearing Info: Adequate Speech Info: Adequate    SPECIAL CARE FACTORS FREQUENCY  PT (By licensed PT), OT (By licensed OT) (Dialysis [facility], T,TH,S. )     PT Frequency:  (5) OT Frequency:  (5)             Contractures      Additional Factors Info  Code Status, Allergies Code Status Info:  (Full Code. ) Allergies Info:  (No Known Allergies. )           Current Medications (10/24/2015):  This is the current hospital active medication list Current Facility-Administered Medications  Medication Dose Route Frequency Provider Last Rate Last Dose  . 0.9 %  sodium chloride infusion  250 mL Intravenous PRN Katharina Caper, MD      . 0.9 %  sodium chloride infusion   Intravenous Continuous Annice Needy, MD 20 mL/hr at 10/23/15 1940    . acetaminophen (TYLENOL) tablet 650 mg  650 mg Oral Q6H PRN Katharina Caper, MD       Or  . acetaminophen (TYLENOL) suppository 650 mg  650 mg Rectal Q6H PRN Katharina Caper, MD      . amLODipine (NORVASC) tablet 2.5 mg  2.5 mg Oral Daily Katharina Caper, MD   2.5 mg at 10/22/15 1623  . aspirin EC tablet 81 mg  81 mg Oral Daily Katharina Caper, MD   81 mg at 10/23/15 1330  . atorvastatin (LIPITOR) tablet 20 mg  20 mg Oral q1800 Katharina Caper, MD      . calcium gluconate 1 g in sodium chloride 0.9 % 100 mL IVPB  1 g Intravenous Once Jene Every, MD      . Chlorhexidine Gluconate Cloth 2 % PADS 6 each  6  each Topical Once Annice NeedyJason S Dew, MD      . cinacalcet [facility](SENSIPAR) tablet 90 mg  90 mg Oral Q supper Katharina Caperima Vaickute, MD      . docusate sodium (COLACE) capsule 100 mg  100 mg Oral BID Katharina Caperima Vaickute, MD   100 mg at 10/23/15 2103  . heparin injection 5,000 Units  5,000 Units Subcutaneous Q8H Alford Highlandichard Wieting, MD   5,000 Units at 10/23/15 2103  . HYDROcodone-acetaminophen (NORCO/VICODIN) 5-325 MG per tablet 1-2 tablet  1-2 tablet Oral Q4H PRN Katharina Caperima Vaickute, MD   1 tablet at 10/23/15 1813  . lisinopril (PRINIVIL,ZESTRIL) tablet 10 mg  10 mg Oral Daily Katharina Caperima Vaickute, MD   10 mg at 10/22/15 1623  . metoCLOPramide (REGLAN) injection 5 mg  5 mg Intravenous Once Alford Highlandichard Wieting, MD      . metoprolol tartrate (LOPRESSOR) tablet 12.5 mg  12.5 mg Oral BID Katharina Caperima Vaickute, MD   12.5 mg at  10/23/15 2103  . morphine 2 MG/ML injection 2 mg  2 mg Intravenous Q3H PRN Katharina Caperima Vaickute, MD   2 mg at 10/24/15 0941  . ondansetron (ZOFRAN) tablet 4 mg  4 mg Oral Q6H PRN Katharina Caperima Vaickute, MD       Or  . ondansetron (ZOFRAN) injection 4 mg  4 mg Intravenous Q6H PRN Katharina Caperima Vaickute, MD   4 mg at 10/23/15 1935  . piperacillin-tazobactam (ZOSYN) IVPB 3.375 g  3.375 g Intravenous Q12H Sheema M Hallaji, RPH   3.375 g at 10/23/15 2104  . polyethylene glycol (MIRALAX / GLYCOLAX) packet 17 g  17 g Oral Daily PRN Katharina Caperima Vaickute, MD      . senna (SENOKOT) tablet 8.6 mg  1 tablet Oral BID Katharina Caperima Vaickute, MD   8.6 mg at 10/23/15 2103  . sevelamer carbonate (RENVELA) tablet 1,600 mg  1,600 mg Oral TID WC Katharina Caperima Vaickute, MD   1,600 mg at 10/23/15 1330  . sodium chloride flush (NS) 0.9 % injection 3 mL  3 mL Intravenous Q12H Katharina Caperima Vaickute, MD   3 mL at 10/23/15 1336  . sodium chloride flush (NS) 0.9 % injection 3 mL  3 mL Intravenous Q12H Katharina Caperima Vaickute, MD   3 mL at 10/23/15 1059  . sodium chloride flush (NS) 0.9 % injection 3 mL  3 mL Intravenous PRN Katharina Caperima Vaickute, MD      . vancomycin (VANCOCIN) IVPB 750 mg/150 ml premix  750 mg Intravenous Q T,Th,Sa-HD Alford Highlandichard Wieting, MD   750 mg at 10/23/15 1701  . venlafaxine XR (EFFEXOR-XR) 24 hr capsule 37.5 mg  37.5 mg Oral Q breakfast Katharina Caperima Vaickute, MD   37.5 mg at 10/23/15 0800     Discharge Medications: Please see discharge summary for a list of discharge medications.  Relevant Imaging Results:  Relevant Lab Results:   Additional Information  (SSN: [id number])  Haig ProphetMorgan, Alawna Graybeal G, LCSW

## 2015-10-24 NOTE — Op Note (Signed)
[facility] Percutaneous Study/Intervention Procedural Note   Date of Surgery: 10/24/2015  Surgeon(s):DEW,JASON   Assistants:none  Pre-operative Diagnosis: PAD with gangrene right foot  Post-operative diagnosis: Same  Procedure(s) Performed: 1. Ultrasound guidance for vascular access left femoral artery 2. Catheter placement into right SFA from left femoral approach 3. Aortogram and selective right lower extremity angiogram 4. StarClose closure device left femoral artery  EBL: 25 cc  Contrast: 90 cc  Fluoro Time: 23 minutes  Moderate Conscious Sedation Time: approximately 70 minutes using 6 mg of Versed and 150 mcg of Fentanyl  Indications: Patient is a 70 y.o.female with nonpalpable pedal pulses and gangrene of the right great toe. The patient is brought in for angiography for further evaluation and potential treatment. Risks and benefits are discussed and informed consent is obtained  Procedure: The patient was identified and appropriate procedural time out was performed. The patient was then placed supine on the table and prepped and draped in the usual sterile fashion.Moderate conscious sedation was administered during a face to face encounter with the patient throughout the procedure with my supervision of the RN administering medicines and monitoring the patient's vital signs, pulse oximetry, telemetry and mental status throughout from the start of the procedure until the patient was taken to the recovery room. Ultrasound was used to evaluate the left common femoral artery. It was patent . A digital ultrasound image was acquired. A Seldinger needle was used to access the left common femoral artery under direct ultrasound guidance and a permanent image was performed. A 0.035 J wire was advanced without resistance and a 5Fr sheath was placed. Pigtail catheter was placed into the aorta  and an AP aortogram was performed. This demonstrated sluggish flow in the renal arteries and normal aorta and iliac segments without significant stenosis. I then crossed the aortic bifurcation and advanced to the right femoral head. Selective right lower extremity angiogram was then performed. This demonstrated Flush occlusion of the SFA with what appeared to be an occlusion of some sort of femoral to popliteal or distal bypass. Reconstitution of the mid distal SFA. Popliteal calcific but patent to its distal aspect where the tibioperoneal trunk appeared to have a moderate to high-grade stenosis. She then had what appeared to be a peroneal artery that was her best runoff distally and reconstitution of the anterior tibial artery distally although this could've been the posterior tibial artery. Imaging was very difficult to discern due to continuous patient motion and sluggish flow. The patient was systemically heparinized and a 6 [place] sheath was then placed over the [employer] wire. I then used a Kumpe catheter and the advantage wire to get into the SFA which was quite tedious.  I used a Kumpe catheter and a CXI catheter, and made multiple attempts to get across the SFA occlusion but was never able to regain intraluminal flow distally.  Several areas of extraluminal passage were seen along the way, and the case was made much more difficult by continuous patient motion and lack of cooperation.  After it was clear that this was going to be an unsuccessful endeavor, I elected to terminate the procedure. The sheath was removed and StarClose closure device was deployed in the left femoral artery with excellent hemostatic result. The patient was taken to the recovery room in stable condition having tolerated the procedure well. Further options for revascularization are very limited, but we could consider a popliteal or pedal approach to attempt to address the occlusive disease  or proceed with primary AKA.  I  do not believe that the patient is a good bypass candidate at this point.    Findings:  Aortogram: sluggish renal flow, normal aorta and iliac arteries Right Lower Extremity: Flush occlusion of the SFA with what appeared to be an occlusion of some sort of femoral to popliteal or distal bypass. Reconstitution of the mid distal SFA. Popliteal calcific but patent to its distal aspect where the tibioperoneal trunk appeared to have a moderate to high-grade stenosis. She then had what appeared to be a peroneal artery that was her best runoff distally and reconstitution of the anterior tibial artery distally although this could've been the posterior tibial artery. Imaging was very difficult to discern due to continuous patient motion and sluggish flow.   Disposition: Patient was taken to the recovery room in stable condition having tolerated the procedure well.  Complications: None  DEW,JASON 10/24/2015 4:50 PM

## 2015-10-24 NOTE — Clinical Social Work Note (Addendum)
Clinical Social Work Assessment  Patient Details  Name: Tracy Drake MRN: [id number] Date of Birth: [birth year]  Date of referral:  10/24/15               Reason for consult:  Facility Placement, Other (Comment Required) (Patient's family was not answering the phone. )                Permission sought to share information with:    Permission granted to share information::     Name::        Agency::     Relationship::     Contact Information:     Housing/Transportation Living arrangements for the past 2 months:  Single Family Home Source of Information:  Patient, Other (Comment Required) (RN/ MD ) Patient Interpreter Needed:  None Criminal Activity/Legal Involvement Pertinent to Current Situation/Hospitalization:  No - Comment as needed Significant Relationships:  Adult Children, Siblings Lives with:  Siblings Do you feel safe going back to the place where you live?  Yes Need for family participation in patient care:  Yes (Comment)  Care giving concerns:  Per chart patient recently moved to [place] from [place] to live with her sister [name].    Social Worker assessment / plan:  Holiday representative (CSW) received verbal consult from MD and RN that patient needs consent for a procedure however her family is not reachable. CSW attempted to call patient's sister with no answer and a voicemail was left. CSW called [employer] and asked them to go by patient's sister's house and ask her to call [facility]. RN did receive a call from sister reporting that her phone was messed up and she will come to [facility] today. RN will call CSW when patient's sister gets to the hospital today. CSW met with patient alone at bedside. CSW introduced self and explained role of CSW department. Patient was pleasantly confused.  CSW met with patient's sister [name] at bedside later in the afternoon. Per sister patient has lived with her for 3 months in [place]. Sister reported that patient's daughter and son live in  [place] and were suppose to take patient back in their homes after 3 months however the adult children have not been answering the phone. Sister reported that patient's daughter just "dumped" patient with her. Sister reported that patient missed a few dialysis sessions because they had to go down [place] for a death in the family. CSW discussed SNF options for short term rehab and long term care. Sister is agreeable to SNF search for STR however she wants patient to return home with her after rehab.   FL2 complete and faxed out. CSW presented bed offers to patient's sister. Sister chose [facility]. Editor, commissioning at [facility] is aware of accepted bed offer. CSW will continue to follow and assist as needed.    CSW made an Adult Scientist, forensic (APS) in [place].   Employment status:  Disabled (Comment on whether or not currently receiving Disability), Retired Forensic scientist:  Medicare PT Recommendations:  Not assessed at this time Information / Referral to community resources:  [place]  Patient/Family's Response to care:  Patient's sister is agreeable for patient to go to [facility] for [employer].   Patient/Family's Understanding of and Emotional Response to Diagnosis, Current Treatment, and Prognosis:  Patient and patient's sister [name] were pleasant and thanked CSW for visit.   Emotional Assessment Appearance:  Appears stated age Attitude/Demeanor/Rapport:    Affect (typically observed):  Pleasant Orientation:  Oriented to Self, Fluctuating Orientation (Suspected and/or reported Sundowners) Alcohol / Substance use:  Not Applicable Psych involvement (Current and /or in the community):  No (Comment)  Discharge Needs  Concerns to be addressed:  Discharge Planning Concerns Readmission within the last 30 days:  No Current discharge risk:  Chronically ill, Cognitively Impaired Barriers to Discharge:  Continued Medical Work  up   Loralyn Freshwater, LCSW 10/24/2015, 11:43 AM

## 2015-10-24 NOTE — Progress Notes (Signed)
Pt returned from procedure at or around 1815 no new changes noted in assessment except patient now with a triple lumen in left groin. Her access site for her angioplasty is also in the left groin a very small  amount of blood noted on ga gauze. Will continue to monitor

## 2015-10-24 NOTE — H&P (Signed)
  [facility] History & Physical Update  The patient was interviewed and re-examined.  The patient's previous History and Physical has been reviewed and is unchanged.  There is no change in the plan of care. We plan to proceed with the scheduled procedure.  Nyree Yonker, MD  10/24/2015, 2:24 PM

## 2015-10-24 NOTE — Progress Notes (Signed)
Contacted by primary service and floor.  Unable to reach family for consent.  Patient marginally able to sign consent.  I discussed case with her yesterday and she seemed to grasp what I was saying, but with a limb and potentially life threatening problem and need for improved perfusion, emergency consent for her procedure today would be appropriate as well.  Primary service has agreed to sign as a second physician as well.  Will proceed with procedure today

## 2015-10-24 NOTE — Progress Notes (Signed)
[facility]  Check right groin for bleeding or hematoma.  Patient will be on bedrest for 2 hours post sheath pull---out of bed at 18:15  Bilateral pulses are doppler's DP's..[name]

## 2015-10-24 NOTE — Progress Notes (Signed)
[facility]  ROUNDING NOTE   Subjective:   Hemodialysis treatments for last two days. Tolerated treatments well.  Today, scheduled for angiogram for right toe ischemia. Consent obtained from sister.    Objective:  Vital signs in last 24 hours:  Temp:  [97.7 F (36.5 C)-98.7 F (37.1 C)] 98.7 F (37.1 C) (07/12 1029) Pulse Rate:  [59-77] 72 (07/12 1029) Resp:  [15-30] 18 (07/12 1029) BP: (87-151)/(46-70) 116/62 mmHg (07/12 0730) SpO2:  [96 %-100 %] 96 % (07/12 1029) Weight:  [62.642 kg (138 lb 1.6 oz)] 62.642 kg (138 lb 1.6 oz) (07/11 1400)  Weight change: 1.406 kg (3 lb 1.6 oz) Filed Weights   10/22/15 2134 10/23/15 0436 10/23/15 1400  Weight: 62.279 kg (137 lb 4.8 oz) 62.642 kg (138 lb 1.6 oz) 62.642 kg (138 lb 1.6 oz)    Intake/Output: I/O last 3 completed shifts: In: 336.7 [I.V.:186.7; IV Piggyback:150] Out: 1570 [Other:1570]   Intake/Output this shift:     Physical Exam: General: NAD, laying in bed  Head: +hard of hearing  Eyes: Anicteric, PERRL  Neck: Supple, trachea midline  Lungs:  Clear to auscultation  Heart: Regular rate and rhythm  Abdomen:  Soft, nontender,   Extremities: Right toe with gangrenous changes  Neurologic: Oriented to self only  Skin: No lesions  Access: RIJ permcath    Basic Metabolic Panel:  Recent Labs Lab 10/22/15 1240 10/23/15 0655 10/23/15 1118  NA 138 139 139  K 7.1* 5.1 4.7  CL 111 106 103  CO2 10* 20* 24  GLUCOSE 64* 66 87  BUN 143* 69* 73*  CREATININE 14.15* 9.01* 9.51*  CALCIUM 9.7 8.8* 9.0  PHOS  --  4.8*  --     Liver Function Tests:  Recent Labs Lab 10/22/15 1240 10/23/15 0655  AST 13*  --   ALT 12*  --   ALKPHOS 148*  --   BILITOT 0.7  --   PROT 7.0  --   ALBUMIN 3.4* 3.1*   No results for input(s): LIPASE, AMYLASE in the last 168 hours. No results for input(s): AMMONIA in the last 168 hours.  CBC:  Recent Labs Lab 10/22/15 1240 10/23/15 1118 10/24/15 0445  WBC 4.8 4.3 4.5   HGB 11.8* 11.6* 11.3*  HCT 35.7 34.0* 33.1*  MCV 99.9 97.6 97.0  PLT 174 167 128*    Cardiac Enzymes:  Recent Labs Lab 10/22/15 2145 10/23/15 0655 10/23/15 1118  TROPONINI <0.03 1.27* <0.03    BNP: Invalid input(s): POCBNP  CBG:  Recent Labs Lab 10/23/15 0751 10/23/15 1206 10/23/15 1812 10/23/15 2037 10/24/15 0806  GLUCAP 77 117* 73 92 74    Microbiology: Results for orders placed or performed during the hospital encounter of 10/22/15  Blood culture (routine x 2)     Status: None (Preliminary result)   Collection Time: 10/22/15  2:15 PM  Result Value Ref Range Status   Specimen Description BLOOD RIGHT ARM  Final   Special Requests BOTTLES DRAWN AEROBIC AND ANAEROBIC 6CC  Final   Culture NO GROWTH 1 DAY  Final   Report Status PENDING  Incomplete  Blood culture (routine x 2)     Status: None (Preliminary result)   Collection Time: 10/22/15  2:25 PM  Result Value Ref Range Status   Specimen Description BLOOD RIGHT ARM  Final   Special Requests BOTTLES DRAWN AEROBIC AND ANAEROBIC 6CC  Final   Culture NO GROWTH 1 DAY  Final   Report Status PENDING  Incomplete  MRSA PCR Screening     Status: None   Collection Time: 10/22/15 10:09 PM  Result Value Ref Range Status   MRSA by PCR NEGATIVE NEGATIVE Final    Comment:        The GeneXpert MRSA Assay (FDA approved for NASAL specimens only), is one component of a comprehensive MRSA colonization surveillance program. It is not intended to diagnose MRSA infection nor to guide or monitor treatment for MRSA infections.     Coagulation Studies:  Recent Labs  10/23/15 1118  LABPROT 14.5  INR 1.11    Urinalysis: No results for input(s): COLORURINE, LABSPEC, PHURINE, GLUCOSEU, HGBUR, BILIRUBINUR, KETONESUR, PROTEINUR, UROBILINOGEN, NITRITE, LEUKOCYTESUR in the last 72 hours.  Invalid input(s): APPERANCEUR    Imaging: Dg Chest Portable 1 View  10/22/2015  CLINICAL DATA:  Right great toe pain. Infection. The  patient has missed her last 2 dialysis appointments. EXAM: PORTABLE CHEST 1 VIEW COMPARISON:  None. FINDINGS: The heart is mildly enlarged. Atherosclerotic calcifications are present within the aortic arch. A left subclavian vein stent is in place. A right IJ dialysis catheter is in place. The tip is in the right ventricle. Mild pulmonary vascular congestion is present without frank edema. No focal airspace consolidation is present. The visualized soft tissues and bony thorax are unremarkable. IMPRESSION: 1. Mild cardiomegaly and pulmonary vascular congestion. 2. Atherosclerosis of the aortic arch. 3. Left subclavian stent. Electronically Signed   By: Marin Roberts M.D.   On: 10/22/2015 13:54   Dg Toe Great Right  10/22/2015  CLINICAL DATA:  Right great toe pain. Toe nail removed approximately 1 week ago without healing. EXAM: RIGHT GREAT TOE COMPARISON:  None. FINDINGS: There are vague lucencies within the tuft of the distal phalanx. No fracture line or displaced fracture fragment seen. Atherosclerotic changes noted within the surrounding soft tissues. IMPRESSION: 1. Vague lucencies within the tuft of the distal phalanx of the great toe. Based on the lateral projection, this is suspected to be due to overlying soft tissue edema. Demineralization and/or early destructive changes of osteomyelitis cannot be excluded. If clinical concern for osteomyelitis, recommend MRI for more definitive characterization. 2. Atherosclerosis. Electronically Signed   By: Bary Richard M.D.   On: 10/22/2015 13:57     Medications:   . sodium chloride 20 mL/hr at 10/23/15 1940   . amLODipine  2.5 mg Oral Daily  . aspirin EC  81 mg Oral Daily  . atorvastatin  20 mg Oral q1800  . calcium gluconate 1 GM IV  1 g Intravenous Once  . Chlorhexidine Gluconate Cloth  6 each Topical Once  . cinacalcet  90 mg Oral Q supper  . docusate sodium  100 mg Oral BID  . heparin subcutaneous  5,000 Units Subcutaneous Q8H  .  lisinopril  10 mg Oral Daily  . metoCLOPramide (REGLAN) injection  5 mg Intravenous Once  . metoprolol tartrate  12.5 mg Oral BID  . piperacillin-tazobactam (ZOSYN)  IV  3.375 g Intravenous Q12H  . senna  1 tablet Oral BID  . sevelamer carbonate  1,600 mg Oral TID WC  . sodium chloride flush  3 mL Intravenous Q12H  . sodium chloride flush  3 mL Intravenous Q12H  . vancomycin  750 mg Intravenous Q T,Th,Sa-HD  . venlafaxine XR  37.5 mg Oral Q breakfast     Assessment/ Plan:  [name] is a 70 y.o. black female with hypertension, diabetes mellitus type II, hyperlipidemia, thyroid disorder and ESRD on hemodialysis. Pt recently  moved from [place] via [place]. Now staying with her sister.   [facility] [address].  RIJ permcath  1. End Stage Renal Disease with hyperkalemia: requiring emergent hemodialysis. Status post 2 consecutive treatments. Next treatment for tomorrow.   2. Hypertension: at goal.  - home regimen of amlodipine, lisinopril  3. Secondary Hyperparathyroidism: outpatient PTH >1360. Phos and calcium at goal.  - continue sevelamer for phos binding - cinacalcet  4. Anemia of chronic kidney disease:  - no acute indication for epo especially with concern for ischemic leg.   5. Peripheral Vascular disease: with pain of his right toe.  - Angiogram for later today. Appreciate vascular and podiatry input.    LOS: 2 Tracy Drake 7/12/201711:18 AM

## 2015-10-24 NOTE — Progress Notes (Signed)
Clinical Child psychotherapistocial Worker (CSW) received verbal consult from MD that patient's sister has not been able to be reached and decisions about patient's care need to be made. MD requested for police to go by patient's sister's house and get the [facility]. CSW contacted [employer] and made the request. Per Amalia Haileyustin at [employer] on officer will go by the address and ask the sister to call 1A nurses station. Per attending physician this is an emergency procedure and she will document in chart accordingly. RN is working on getting a second physician to agree it is an emergency procedure and document in the chart so the procedure can be done.   CSW also attempted to call patient's sister Robert BellowDiane Bradley however the phone did not ring and a voicemail was left. CSW will continue to follow and assist as needed.   Jetta LoutBailey Morgan, LCSW [phone]

## 2015-10-24 NOTE — Progress Notes (Signed)
Patient ID: Tracy Drake, female   DOB: 09/28/1945, 70 y.o.   MRN: [id number] [facility] PROGRESS NOTE  Tracy Drake WUJ:[id number] DOB: 09/11/1945 DOA: 10/22/2015 PCP: Pcp Not In System  HPI/Subjective: Patient complaining of right first toe pain. No complaints of chest pain or shortness of breath. Patient's right great toe was noted to be black in color, dusky, initiated on heparin IV drip due to concerns of ischemia. Vascular surgery recommended angiogram, to be performed today by Dr. Wyn Quakerew. Consent was obtained from patient's sister. Patient denies any pain, admits of feeling better overall  Objective: Filed Vitals:   10/24/15 0730 10/24/15 1029  BP: 116/62   Pulse: 74 72  Temp: 98.5 F (36.9 C) 98.7 F (37.1 C)  Resp: 20 18    Filed Weights   10/22/15 2134 10/23/15 0436 10/23/15 1400  Weight: 62.279 kg (137 lb 4.8 oz) 62.642 kg (138 lb 1.6 oz) 62.642 kg (138 lb 1.6 oz)    ROS: Review of Systems  Constitutional: Negative for fever, chills and weight loss.  HENT: Negative for congestion.   Eyes: Negative for blurred vision and double vision.  Respiratory: Negative for cough, sputum production, shortness of breath and wheezing.   Cardiovascular: Negative for chest pain, palpitations, orthopnea, leg swelling and PND.  Gastrointestinal: Negative for nausea, vomiting, abdominal pain, diarrhea, constipation and blood in stool.  Genitourinary: Negative for dysuria, urgency, frequency and hematuria.  Musculoskeletal: Negative for falls.  Neurological: Negative for dizziness, tremors, focal weakness and headaches.  Endo/Heme/Allergies: Does not bruise/bleed easily.  Psychiatric/Behavioral: Negative for depression. The patient does not have insomnia.    Exam: Physical Exam  HENT:  Nose: No mucosal edema.  Mouth/Throat: No oropharyngeal exudate or posterior oropharyngeal edema.  Eyes: Conjunctivae, EOM and lids are normal. Pupils are equal, round, and reactive to light.   Neck: No JVD present. Carotid bruit is not present. No edema present. No thyroid mass and no thyromegaly present.  Cardiovascular: S1 normal and S2 normal.  Exam reveals no gallop.   Murmur heard.  Systolic murmur is present with a grade of 2/6  Pulses:      Dorsalis pedis pulses are 0 on the right side, and 0 on the left side.  Respiratory: No respiratory distress. She has no wheezes. She has no rhonchi. She has no rales.  GI: Soft. Bowel sounds are normal. There is no tenderness.  Musculoskeletal:       Right ankle: She exhibits no swelling.       Left ankle: She exhibits no swelling.  Lymphadenopathy:    She has no cervical adenopathy.  Neurological: She is alert.  Skin: Skin is warm. Nails show no clubbing.  Right first toe with blackish discoloration looks like it's starting to demarcate.  Psychiatric: She has a normal mood and affect.  Patient is sleepy, however, awaken and converses, answers questions appropriately    Data Reviewed: Basic Metabolic Panel:  Recent Labs Lab 10/22/15 1240 10/23/15 0655 10/23/15 1118  NA 138 139 139  K 7.1* 5.1 4.7  CL 111 106 103  CO2 10* 20* 24  GLUCOSE 64* 66 87  BUN 143* 69* 73*  CREATININE 14.15* 9.01* 9.51*  CALCIUM 9.7 8.8* 9.0  PHOS  --  4.8*  --    Liver Function Tests:  Recent Labs Lab 10/22/15 1240 10/23/15 0655  AST 13*  --   ALT 12*  --   ALKPHOS 148*  --   BILITOT 0.7  --   PROT 7.0  --  ALBUMIN 3.4* 3.1*   CBC:  Recent Labs Lab 10/22/15 1240 10/23/15 1118 10/24/15 0445  WBC 4.8 4.3 4.5  HGB 11.8* 11.6* 11.3*  HCT 35.7 34.0* 33.1*  MCV 99.9 97.6 97.0  PLT 174 167 128*   Cardiac Enzymes:  Recent Labs Lab 10/22/15 2145 10/23/15 0655 10/23/15 1118  TROPONINI <0.03 1.27* <0.03    CBG:  Recent Labs Lab 10/23/15 1206 10/23/15 1812 10/23/15 2037 10/24/15 0806 10/24/15 1156  GLUCAP 117* 73 92 74 80    Recent Results (from the past 240 hour(s))  Blood culture (routine x 2)     Status:  None (Preliminary result)   Collection Time: 10/22/15  2:15 PM  Result Value Ref Range Status   Specimen Description BLOOD RIGHT ARM  Final   Special Requests BOTTLES DRAWN AEROBIC AND ANAEROBIC 6CC  Final   Culture NO GROWTH 1 DAY  Final   Report Status PENDING  Incomplete  Blood culture (routine x 2)     Status: None (Preliminary result)   Collection Time: 10/22/15  2:25 PM  Result Value Ref Range Status   Specimen Description BLOOD RIGHT ARM  Final   Special Requests BOTTLES DRAWN AEROBIC AND ANAEROBIC 6CC  Final   Culture NO GROWTH 1 DAY  Final   Report Status PENDING  Incomplete  MRSA PCR Screening     Status: None   Collection Time: 10/22/15 10:09 PM  Result Value Ref Range Status   MRSA by PCR NEGATIVE NEGATIVE Final    Comment:        The GeneXpert MRSA Assay (FDA approved for NASAL specimens only), is one component of a comprehensive MRSA colonization surveillance program. It is not intended to diagnose MRSA infection nor to guide or monitor treatment for MRSA infections.      Studies: No results found.  Scheduled Meds: . amLODipine  2.5 mg Oral Daily  . aspirin EC  81 mg Oral Daily  . atorvastatin  20 mg Oral q1800  . calcium gluconate 1 GM IV  1 g Intravenous Once  . Chlorhexidine Gluconate Cloth  6 each Topical Once  . cinacalcet  90 mg Oral Q supper  . docusate sodium  100 mg Oral BID  . heparin subcutaneous  5,000 Units Subcutaneous Q8H  . lisinopril  10 mg Oral Daily  . metoCLOPramide (REGLAN) injection  5 mg Intravenous Once  . metoprolol tartrate  12.5 mg Oral BID  . piperacillin-tazobactam (ZOSYN)  IV  3.375 g Intravenous Q12H  . senna  1 tablet Oral BID  . sevelamer carbonate  1,600 mg Oral TID WC  . sodium chloride flush  3 mL Intravenous Q12H  . sodium chloride flush  3 mL Intravenous Q12H  . vancomycin  750 mg Intravenous Q T,Th,Sa-HD  . venlafaxine XR  37.5 mg Oral Q breakfast    Assessment/Plan:  1. Right first toe pain, Suspected  osteomyelitis, however, due to blackish discoloration this could be an ischemic toe. I will start heparin drip. X-ray could not rule out an osteomyelitis , continue  antibiotics and an MRI was ordered. Appreciate podiatry and vascular surgery input, patient will be going to angiogram today, she will likely need to have right great toe amputated, per podiatrist, depending on angiogram results. 2. Hyperkalemia on presentation secondary to missed hemodialysis sessions. The patient underwent hemodialysis ,  improved potassium , following closely. 3. Elevated troponin.  Patient not having chest pain or shortness of breath. Patient already on aspirin and metoprolol and statin.  Cardiology consultation is appreciated, further evaluation of his pharmacologic nuclear stress test was recommended, echocardiogram revealed normal ejection fraction, LVH, grade 1 diastolic dysfunction. 4. End-stage renal disease on hemodialysis as per nephrology, patient received 2 consecutive treatments, next dialysis tomorrow 5. Anemia of chronic disease, stable 6. Hemoglobin A1c is low patient is not a diabetic 7 Essential hypertension, but now patient has low blood pressure, discontinue Norvasc , follow blood pressure readings closely and reinitiate blood pressure medications as needed    Code Status:     Code Status Orders        Start     Ordered   10/22/15 2049  Full code   Continuous     10/22/15 2048    Code Status History    Date Active Date Inactive Code Status Order ID Comments User Context   This patient has a current code status but no historical code status.     Family Communication: Left message for sister today again Disposition Plan: To be determined, may need Adult Protective Services  Consultants:  Cardiology  Nephrology  Podiatry  Vascular surgery  Antibiotics:  Vancomycin  Zosyn  Time spent: 35 minutes  [employer]

## 2015-10-24 NOTE — Progress Notes (Signed)
Called patients sister left message to please call back asap.

## 2015-10-24 NOTE — Clinical Social Work Placement (Signed)
   CLINICAL SOCIAL WORK PLACEMENT  NOTE  Date:  10/24/2015  Patient Details  Name: Tracy Drake MRN: [id number] Date of Birth: 03/04/1946  Clinical Social Work is seeking post-discharge placement for this patient at the Skilled  Nursing Facility level of care (*CSW will initial, date and re-position this form in  chart as items are completed):  Yes   Patient/family provided with [facility] Clinical Social Work Department's list of facilities offering this level of care within the geographic area requested by the patient (or if unable, by the patient's family).  Yes   Patient/family informed of their freedom to choose among providers that offer the needed level of care, that participate in Medicare, Medicaid or managed care program needed by the patient, have an available bed and are willing to accept the patient.  Yes   Patient/family informed of [facility]'s ownership interest in [facility] and [facility], as well as of the fact that they are under no obligation to receive care at these facilities.  PASRR submitted to EDS on 10/24/15     PASRR number received on 10/24/15     Existing PASRR number confirmed on       FL2 transmitted to all facilities in geographic area requested by pt/family on 10/24/15     FL2 transmitted to all facilities within larger geographic area on       Patient informed that his/her managed care company has contracts with or will negotiate with certain facilities, including the following:        Yes   Patient/family informed of bed offers received.  Patient chooses bed at  [facility]     Physician recommends and patient chooses bed at      Patient to be transferred to   on  .  Patient to be transferred to facility by       Patient family notified on   of transfer.  Name of family member notified:        PHYSICIAN       Additional Comment:    _______________________________________________ Haig ProphetMorgan, Ellena Kamen G,  LCSW 10/24/2015, 2:11 PM

## 2015-10-24 NOTE — Progress Notes (Signed)
Pt with Angiogram and has morning heparin ordered subcutaneous this morning. Spoke with Dr. Tobi BastosPyreddy ok to hold this morning.

## 2015-10-24 NOTE — Progress Notes (Signed)
I spoke with Dr. dew today following patient's angioplasty. He stated that she has severe disease with less than desirable results with the attempts today. He states she was moving continuously which made it very difficult to progress with angioplasty. He also doesn't think she is a good bypass candidate at this point. In addition is hard to get in touch with family members and get a sense of explanation as to what the patient's problems are. Right now we'll continue to treat the toe with palliative care wet-to-dry saline dressings. See out develops over the next day or 2 and determine if anything else needs to be done with the digit itself at this point. I did review x-rays and they were fairly negative for any type of degenerative change to the distal phalanx. MRI has not been performed as of yet to my knowledge.

## 2015-10-24 NOTE — Progress Notes (Signed)
Dr Winona LegatoVaickute made aware of nursing inability to get consent signed. Patients sister  Has been called many times  Messages have been left.  Spoke with SW to inquire about sending the police to patients sisters house per Dr V request. SW had already started to initiate process. It has been communicated to me per Fredric MareBailey SW that if both doctors agree that the procedure is an emergency then it will okay to move forward with angiogram. Spoke with Dr V via phone and she states she is agreeable, spoke with Dr Wyn Quakerew via phone and is agreeable per OR nurse.

## 2015-10-24 NOTE — Plan of Care (Signed)
Problem: Pain Managment: Goal: General experience of comfort will improve Outcome: Progressing Pain control with oral pain medication  Problem: Physical Regulation: Goal: Will remain free from infection Outcome: Progressing Pt still receiving iv antibiotics.   Problem: Activity: Goal: Risk for activity intolerance will decrease Outcome: Progressing Pt is not ambulatory at this time.

## 2015-10-25 ENCOUNTER — Encounter: Payer: Self-pay | Admitting: Vascular Surgery

## 2015-10-25 LAB — CBC
HEMATOCRIT: 30.8 % — AB (ref 35.0–47.0)
Hemoglobin: 10.3 g/dL — ABNORMAL LOW (ref 12.0–16.0)
MCH: 32.7 pg (ref 26.0–34.0)
MCHC: 33.4 g/dL (ref 32.0–36.0)
MCV: 98.2 fL (ref 80.0–100.0)
PLATELETS: 127 10*3/uL — AB (ref 150–440)
RBC: 3.13 MIL/uL — AB (ref 3.80–5.20)
RDW: 12.9 % (ref 11.5–14.5)
WBC: 4.6 10*3/uL (ref 3.6–11.0)

## 2015-10-25 LAB — GLUCOSE, CAPILLARY
GLUCOSE-CAPILLARY: 142 mg/dL — AB (ref 65–99)
Glucose-Capillary: 158 mg/dL — ABNORMAL HIGH (ref 65–99)
Glucose-Capillary: 124 mg/dL — ABNORMAL HIGH (ref 65–99)

## 2015-10-25 LAB — RENAL FUNCTION PANEL
Albumin: 2.7 g/dL — ABNORMAL LOW (ref 3.5–5.0)
Anion gap: 12 (ref 5–15)
BUN: 42 mg/dL — AB (ref 6–20)
CHLORIDE: 95 mmol/L — AB (ref 101–111)
CO2: 29 mmol/L (ref 22–32)
CREATININE: 7 mg/dL — AB (ref 0.44–1.00)
Calcium: 8.9 mg/dL (ref 8.9–10.3)
GFR calc non Af Amer: 5 mL/min — ABNORMAL LOW (ref >60)
GFR, EST AFRICAN AMERICAN: 6 mL/min — AB (ref >60)
Glucose, Bld: 136 mg/dL — ABNORMAL HIGH (ref 65–99)
POTASSIUM: 4.1 mmol/L (ref 3.5–5.1)
Phosphorus: 6.9 mg/dL — ABNORMAL HIGH (ref 2.5–4.6)
Sodium: 136 mmol/L (ref 135–145)

## 2015-10-25 LAB — VANCOMYCIN, RANDOM: Vancomycin Rm: 16

## 2015-10-25 LAB — PLATELET COUNT: Platelets: 130 10*3/uL — ABNORMAL LOW (ref 150–440)

## 2015-10-25 MED ORDER — MUPIROCIN 2 % EX OINT
TOPICAL_OINTMENT | Freq: Two times a day (BID) | CUTANEOUS | Status: DC
  Administered 2015-10-26 – 2015-10-28 (×4): via TOPICAL
  Administered 2015-10-29: 1 via TOPICAL
  Administered 2015-10-29: 21:00:00 via TOPICAL
  Administered 2015-10-30: 1 via TOPICAL
  Administered 2015-10-30 – 2015-11-07 (×12): via TOPICAL
  Filled 2015-10-25: qty 22

## 2015-10-25 MED ORDER — MUPIROCIN 2 % EX OINT
TOPICAL_OINTMENT | Freq: Every day | CUTANEOUS | Status: DC
  Filled 2015-10-25: qty 22

## 2015-10-25 NOTE — Progress Notes (Signed)
Post hd tx 

## 2015-10-25 NOTE — Progress Notes (Signed)
Plan is for patient to D/C to [facility]. Per MD and RN in progression rounds patient is not ready for D/C today. Clinical Social Worker (CSW) will continue to follow and assist as needed.   Baker Hughes IncorporatedBailey Sharday Michl, LCSW [phone]

## 2015-10-25 NOTE — Progress Notes (Signed)
Patient Demographics  Tracy Drake, is a 70 y.o. female   MRN: [id number]   DOB - 10/03/1945  Admit Date - 10/22/2015    Outpatient Primary MD for the patient is Pcp Not In System  Consult requested in the Hospital by Katharina Caperima Vaickute, MD, On 10/25/2015    With History of -  Past Medical History  Diagnosis Date  . Diabetes mellitus (HCC)     a. not on medications  . History of stroke   . ESRD (end stage renal disease) on dialysis (HCC)   . Essential hypertension   . HLD (hyperlipidemia)   . Diabetic neuropathy [facility](HCC)       Past Surgical History  Procedure Laterality Date  . Peripheral vascular catheterization Right 10/24/2015    Procedure: Lower Extremity Angiography;  Surgeon: Annice NeedyJason S Dew, MD;  Location: [facility] INVASIVE CV LAB;  Service: Cardiovascular;  Laterality: Right;    in for   Chief Complaint  Patient presents with  . Toe Pain     HPI  Tracy Drake  is a 70 y.o. female, Been treated in the office by Dr. Linus Galasodd Cline was made in the hospital because of some drainage from the area. Other medical issues as well. Attempts at vascular reconstruction remaining couple days ago but success was fairly minimal due to severity of occlusions and also patient intolerance to the procedure. Patient is getting dialysis every other day  Social History Social History  Substance Use Topics  . Smoking status: Never Smoker   . Smokeless tobacco: Never Used  . Alcohol Use: No     Family History Family History  Problem Relation Age of Onset  . Hypertension Mother      Prior to Admission medications   Medication Sig Start Date End Date Taking? Authorizing Provider  amLODipine (NORVASC) 2.5 MG tablet Take 2.5 mg by mouth daily.   Yes Historical Provider, MD  aspirin EC 81 MG tablet Take 81 mg by mouth daily.    Yes Historical Provider, MD  atorvastatin (LIPITOR) 20 MG tablet Take 20 mg by mouth daily at 6 PM.   Yes Historical Provider, MD  cinacalcet (SENSIPAR) 90 MG tablet Take 90 mg by mouth daily with supper.   Yes Historical Provider, MD  lisinopril (PRINIVIL,ZESTRIL) 10 MG tablet Take 10 mg by mouth daily.   Yes Historical Provider, MD  sevelamer carbonate (RENVELA) 800 MG tablet Take 1,600 mg by mouth 3 (three) times daily with meals.   Yes Historical Provider, MD  venlafaxine XR (EFFEXOR-XR) 37.5 MG 24 hr capsule Take 37.5 mg by mouth daily with breakfast.   Yes Historical Provider, MD    Anti-infectives    Start     Dose/Rate Route Frequency Ordered Stop   10/23/15 1200  vancomycin (VANCOCIN) IVPB 750 mg/150 ml premix     750 mg 150 mL/hr over 60 Minutes Intravenous Every T-Th-Sa (Hemodialysis) 10/23/15 1104     10/23/15 0800  vancomycin (VANCOCIN) IVPB 750 mg/150 ml premix     750 mg 150 mL/hr over 60 Minutes Intravenous  Once 10/23/15 0741 10/23/15 1158   10/22/15 2200  piperacillin-tazobactam (ZOSYN) IVPB 3.375 g     3.375 g 12.5  mL/hr over 240 Minutes Intravenous Every 12 hours 10/22/15 2048     10/22/15 1445  vancomycin (VANCOCIN) IVPB 1000 mg/200 mL premix     1,000 mg 200 mL/hr over 60 Minutes Intravenous  Once 10/22/15 1442 10/22/15 1729   10/22/15 1445  piperacillin-tazobactam (ZOSYN) IVPB 3.375 g     3.375 g 100 mL/hr over 30 Minutes Intravenous  Once 10/22/15 1444 10/22/15 1650      Scheduled Meds: . aspirin EC  81 mg Oral Daily  . atorvastatin  20 mg Oral q1800  . calcium gluconate 1 GM IV  1 g Intravenous Once  . cinacalcet  90 mg Oral Q supper  . docusate sodium  100 mg Oral BID  . heparin subcutaneous  5,000 Units Subcutaneous Q8H  . lisinopril  10 mg Oral Daily  . metoCLOPramide (REGLAN) injection  5 mg Intravenous Once  . metoprolol tartrate  12.5 mg Oral BID  . piperacillin-tazobactam (ZOSYN)  IV  3.375 g Intravenous Q12H  . senna  1 tablet Oral BID  .  sevelamer carbonate  1,600 mg Oral TID WC  . sodium chloride flush  3 mL Intravenous Q12H  . sodium chloride flush  3 mL Intravenous Q12H  . vancomycin  750 mg Intravenous Q T,Th,Sa-HD  . venlafaxine XR  37.5 mg Oral Q breakfast   Continuous Infusions:  PRN Meds:.sodium chloride, acetaminophen **OR** acetaminophen, HYDROcodone-acetaminophen, morphine injection, ondansetron **OR** ondansetron (ZOFRAN) IV, polyethylene glycol, sodium chloride flush  No Known Allergies  Physical Exam  Vitals  Blood pressure 112/53, pulse 72, temperature 98.8 F (37.1 C), temperature source Axillary, resp. rate 18, height 5\' 2"  (1.575 m), weight 64.636 kg (142 lb 7.9 oz), SpO2 100 %.  Lower Extremity exam:  Vascular:Nonpalpable pulses bilateral. A little bit of discoloration to the right great toe but not severe at this point. No specific necrosis is noted.  Dermatological: Hyperkeratotic buildup is noted on the lateral side of the right great toe but there is no drainage no ulceration no evidence of active infection.  Neurological: Patient does feel pain that region.  Ortho: X-rays have not shown any indication of significant demineralization or osteomyelitis. MRIs were not able to be performed as per radiology.  Data Review  CBC  Recent Labs Lab 10/22/15 1240 10/23/15 1118 10/24/15 0445 10/25/15 0454  WBC 4.8 4.3 4.5  --   HGB 11.8* 11.6* 11.3*  --   HCT 35.7 34.0* 33.1*  --   PLT 174 167 128* 130*  MCV 99.9 97.6 97.0  --   MCH 33.0 33.4 33.0  --   MCHC 33.0 34.2 34.0  --   RDW 13.9 13.8 13.3  --    ------------------------------------------------------------------------------------------------------------------  Chemistries   Recent Labs Lab 10/22/15 1240 10/23/15 0655 10/23/15 1118  NA 138 139 139  K 7.1* 5.1 4.7  CL 111 106 103  CO2 10* 20* 24  GLUCOSE 64* 66 87  BUN 143* 69* 73*  CREATININE 14.15* 9.01* 9.51*  CALCIUM 9.7 8.8* 9.0  AST 13*  --   --   ALT 12*  --   --    ALKPHOS 148*  --   --   BILITOT 0.7  --   --    ------------------------------------------------------------------------------------------------------------------ estimated creatinine clearance is 4.8 mL/min (by C-G formula based on Cr of 9.51). ------------------------------------------------------------------------------------------------------------------ No results for input(s): TSH, T4TOTAL, T3FREE, THYROIDAB in the last 72 hours.  Invalid input(s): FREET3    ----------- Imaging results: See above note  No results found.  Assessment & Plan: At this stage L does have a wet-to-dry saline dressing put on that area to try to soften up some of that callus tissue in the region. I don't think she is a good candidate for aggressive debridement and order a think it needs aggressive debridement at this point. Would likely continue antibiotics due to a potential threat for osteomyelitis but may shift oral at some point after she leaves the hospital. She's been prescribed antibiotics in the past by Dr. Linus Galas but they failed to pick these up and utilized. She has severe peripheral arterial disease and will be followed by Dr. Wyn Quaker. Would not recommend any type of surgical approach until improved vascularization has been accomplished if possible.  Principal Problem:   Toe osteomyelitis, right (HCC) Active Problems:   ESRD (end stage renal disease) (HCC)   Hyperkalemia   Anemia of chronic disease   Osteomyelitis (HCC)   Pressure ulcer   Miro Balderson G M.D on 10/25/2015 at 12:56 PM  Thank you for the consult, we will follow the patient with you in the Hospital.

## 2015-10-25 NOTE — Progress Notes (Signed)
Pt  Returned from Dialysis. Assessment unchanged from previous assessment. She did urinate a small amount today. Gauze on the left groin  has old drainage from procedure triple lumen also in left groin working well. pt's sister mary visited today and states she will be back in the morning.

## 2015-10-25 NOTE — Consult Note (Signed)
Pharmacy Antibiotic Note  Tracy CarrowLouvenia Drake is a 70 y.o. female admitted on 10/22/2015 with infected right great toe.  Pharmacy has been consulted for vancomycin and zosyn dosing. Pt is ESRD on HD T,Thr,Sat  Plan: vancomycin pre HD level resulted at 16. Continue current dose of  vancomycin 750mg  q T, Thus,Sat with HD. Continue zosyn 3.375g q 12 hours  Height: 5\' 2"  (157.5 cm) Weight: 142 lb 7.9 oz (64.636 kg) IBW/kg (Calculated) : 50.1  Temp (24hrs), Avg:98.1 F (36.7 C), Min:97.6 F (36.4 C), Max:98.7 F (37.1 C)   Recent Labs Lab 10/22/15 1240 10/23/15 0655 10/23/15 1118 10/24/15 0445 10/25/15 0454  WBC 4.8  --  4.3 4.5  --   CREATININE 14.15* 9.01* 9.51*  --   --   VANCORANDOM  --   --   --   --  16    Estimated Creatinine Clearance: 4.8 mL/min (by C-G formula based on Cr of 9.51).    No Known Allergies  Antimicrobials this admission: vancomycin 7/10 >>  zosyn 7/10 >>   Dose adjustments this admission:   Microbiology results: Recent Results (from the past 240 hour(s))  Blood culture (routine x 2)     Status: None (Preliminary result)   Collection Time: 10/22/15  2:15 PM  Result Value Ref Range Status   Specimen Description BLOOD RIGHT ARM  Final   Special Requests BOTTLES DRAWN AEROBIC AND ANAEROBIC 6CC  Final   Culture NO GROWTH 3 DAYS  Final   Report Status PENDING  Incomplete  Blood culture (routine x 2)     Status: None (Preliminary result)   Collection Time: 10/22/15  2:25 PM  Result Value Ref Range Status   Specimen Description BLOOD RIGHT ARM  Final   Special Requests BOTTLES DRAWN AEROBIC AND ANAEROBIC 6CC  Final   Culture NO GROWTH 3 DAYS  Final   Report Status PENDING  Incomplete  MRSA PCR Screening     Status: None   Collection Time: 10/22/15 10:09 PM  Result Value Ref Range Status   MRSA by PCR NEGATIVE NEGATIVE Final    Comment:        The GeneXpert MRSA Assay (FDA approved for NASAL specimens only), is one component of a comprehensive MRSA  colonization surveillance program. It is not intended to diagnose MRSA infection nor to guide or monitor treatment for MRSA infections.      Thank you for allowing pharmacy to be a part of this patient's care.  Olene FlossMelissa D Cru Kritikos 10/25/2015 10:25 AM

## 2015-10-25 NOTE — Progress Notes (Signed)
Patient ID: Tracy Drake, female   DOB: 12-01-1945, 70 y.o.   MRN: [id number] [employer] PROGRESS NOTE  Juanell Saffo WUJ:[id number] DOB: July 02, 1945 DOA: 10/22/2015 PCP: Pcp Not In System  HPI/Subjective: The patient was admitted to the right great toe pain, increased warmth of the skin, ulceration, purulence, concerning for acute osteomyelitis, peripheral vascular disease, patient underwent angiogram, revealing significant, severe peripheral vascular disease. Patient was seen by podiatrist, however, no procedure could have been done due to severe peripheral vascular disease not amenable to surgical intervention at present. Patient complains of not feeling well, she remains on vancomycin and Zosyn. Blood cultures showed no growth, MRSA PCR is negative, wound cultures are not obtained, no drainage.  Objective: Filed Vitals:   10/25/15 1503 10/25/15 1530  BP: 103/49 122/49  Pulse: 61 66  Temp:    Resp: 11 11    Filed Weights   10/23/15 1400 10/25/15 0500 10/25/15 1245  Weight: 62.642 kg (138 lb 1.6 oz) 64.636 kg (142 lb 7.9 oz) 64.636 kg (142 lb 7.9 oz)    ROS: Review of Systems  Constitutional: Negative for fever, chills and weight loss.  HENT: Negative for congestion.   Eyes: Negative for blurred vision and double vision.  Respiratory: Negative for cough, sputum production, shortness of breath and wheezing.   Cardiovascular: Negative for chest pain, palpitations, orthopnea, leg swelling and PND.  Gastrointestinal: Negative for nausea, vomiting, abdominal pain, diarrhea, constipation and blood in stool.  Genitourinary: Negative for dysuria, urgency, frequency and hematuria.  Musculoskeletal: Negative for falls.  Neurological: Negative for dizziness, tremors, focal weakness and headaches.  Endo/Heme/Allergies: Does not bruise/bleed easily.  Psychiatric/Behavioral: Negative for depression. The patient does not have insomnia.    Exam: Physical Exam  HENT:  Nose: No mucosal  edema.  Mouth/Throat: No oropharyngeal exudate or posterior oropharyngeal edema.  Eyes: Conjunctivae, EOM and lids are normal. Pupils are equal, round, and reactive to light.  Neck: No JVD present. Carotid bruit is not present. No edema present. No thyroid mass and no thyromegaly present.  Cardiovascular: S1 normal and S2 normal.  Exam reveals no gallop.   Murmur heard.  Systolic murmur is present with a grade of 2/6  Pulses:      Dorsalis pedis pulses are 0 on the right side, and 0 on the left side.  Respiratory: No respiratory distress. She has no wheezes. She has no rhonchi. She has no rales.  GI: Soft. Bowel sounds are normal. There is no tenderness.  Musculoskeletal:       Right ankle: She exhibits no swelling.       Left ankle: She exhibits no swelling.  Lymphadenopathy:    She has no cervical adenopathy.  Neurological: She is alert.  Skin: Skin is warm. Nails show no clubbing.  Right first toe with blackish discoloration looks like it's starting to demarcate.  Psychiatric: She has a normal mood and affect.  Patient is sleepy, however, awaken and converses, answers questions appropriately    Data Reviewed: Basic Metabolic Panel:  Recent Labs Lab 10/22/15 1240 10/23/15 0655 10/23/15 1118 10/25/15 1321  NA 138 139 139 136  K 7.1* 5.1 4.7 4.1  CL 111 106 103 95*  CO2 10* 20* 24 29  GLUCOSE 64* 66 87 136*  BUN 143* 69* 73* 42*  CREATININE 14.15* 9.01* 9.51* 7.00*  CALCIUM 9.7 8.8* 9.0 8.9  PHOS  --  4.8*  --  6.9*   Liver Function Tests:  Recent Labs Lab 10/22/15 1240 10/23/15 0655 10/25/15  1321  AST 13*  --   --   ALT 12*  --   --   ALKPHOS 148*  --   --   BILITOT 0.7  --   --   PROT 7.0  --   --   ALBUMIN 3.4* 3.1* 2.7*   CBC:  Recent Labs Lab 10/22/15 1240 10/23/15 1118 10/24/15 0445 10/25/15 0454 10/25/15 1321  WBC 4.8 4.3 4.5  --  4.6  HGB 11.8* 11.6* 11.3*  --  10.3*  HCT 35.7 34.0* 33.1*  --  30.8*  MCV 99.9 97.6 97.0  --  98.2  PLT 174  167 128* 130* 127*   Cardiac Enzymes:  Recent Labs Lab 10/22/15 2145 10/23/15 0655 10/23/15 1118  TROPONINI <0.03 1.27* <0.03    CBG:  Recent Labs Lab 10/24/15 0806 10/24/15 1156 10/24/15 2044 10/25/15 0732 10/25/15 1215  GLUCAP 74 80 84 158* 142*    Recent Results (from the past 240 hour(s))  Blood culture (routine x 2)     Status: None (Preliminary result)   Collection Time: 10/22/15  2:15 PM  Result Value Ref Range Status   Specimen Description BLOOD RIGHT ARM  Final   Special Requests BOTTLES DRAWN AEROBIC AND ANAEROBIC 6CC  Final   Culture NO GROWTH 3 DAYS  Final   Report Status PENDING  Incomplete  Blood culture (routine x 2)     Status: None (Preliminary result)   Collection Time: 10/22/15  2:25 PM  Result Value Ref Range Status   Specimen Description BLOOD RIGHT ARM  Final   Special Requests BOTTLES DRAWN AEROBIC AND ANAEROBIC 6CC  Final   Culture NO GROWTH 3 DAYS  Final   Report Status PENDING  Incomplete  MRSA PCR Screening     Status: None   Collection Time: 10/22/15 10:09 PM  Result Value Ref Range Status   MRSA by PCR NEGATIVE NEGATIVE Final    Comment:        The GeneXpert MRSA Assay (FDA approved for NASAL specimens only), is one component of a comprehensive MRSA colonization surveillance program. It is not intended to diagnose MRSA infection nor to guide or monitor treatment for MRSA infections.      Studies: No results found.  Scheduled Meds: . aspirin EC  81 mg Oral Daily  . atorvastatin  20 mg Oral q1800  . calcium gluconate 1 GM IV  1 g Intravenous Once  . cinacalcet  90 mg Oral Q supper  . docusate sodium  100 mg Oral BID  . heparin subcutaneous  5,000 Units Subcutaneous Q8H  . lisinopril  10 mg Oral Daily  . metoCLOPramide (REGLAN) injection  5 mg Intravenous Once  . metoprolol tartrate  12.5 mg Oral BID  . mupirocin ointment   Topical BID  . piperacillin-tazobactam (ZOSYN)  IV  3.375 g Intravenous Q12H  . senna  1 tablet  Oral BID  . sevelamer carbonate  1,600 mg Oral TID WC  . sodium chloride flush  3 mL Intravenous Q12H  . sodium chloride flush  3 mL Intravenous Q12H  . vancomycin  750 mg Intravenous Q T,Th,Sa-HD  . venlafaxine XR  37.5 mg Oral Q breakfast    Assessment/Plan:  1. Right first toe pain due to osteomyelitis, also ischemic toe. Now on heparin subcutaneously.  X-ray could not rule out an osteomyelitis , continue  antibiotics and an MRI was ordered. Appreciate podiatry and vascular surgery input, patient underwent angiography July 12, sluggish flow was noted, likely severe  peripheral vascular disease, not an amenable to intervention due to patient moving intermittently on procedure table, unable to get great toe amputated due to severe peripheral vascular disease. Awaiting for great toe. MRI, get infectious disease consultation to advise regarding outpatient antibiotics.  2. Hyperkalemia on presentation secondary to missed hemodialysis sessions. The patient underwent hemodialysis ,  improved potassium , following closely. 3. Elevated troponin.  Patient not having chest pain or shortness of breath. Patient already on aspirin and metoprolol and statin. Cardiology consultation is appreciated, further evaluation of his pharmacologic nuclear stress test was recommended, possibly outpatient, echocardiogram revealed normal ejection fraction, LVH, grade 1 diastolic dysfunction. 4. End-stage renal disease on hemodialysis as per nephrology, patient received few consecutive treatments, next dialysis . According to the schedule 5. Anemia of chronic disease, stable 6. Hemoglobin A1c is low patient is not a diabetic 7 Essential hypertension, but now patient has low blood pressure, now off Norvasc, discontinue lisinopril. Since patient's systolic blood pressure was in 80s. Today , follow blood pressure readings closely and reinitiate blood pressure medications as needed    Code Status:     Code Status Orders         Start     Ordered   10/22/15 2049  Full code   Continuous     10/22/15 2048    Code Status History    Date Active Date Inactive Code Status Order ID Comments User Context   This patient has a current code status but no historical code status.     Family Communication: Left message for sister today again Disposition Plan: To be determined, may need Adult Protective Services  Consultants:  Cardiology  Nephrology  Podiatry  Vascular surgery  Antibiotics:  Vancomycin  Zosyn  Time spent: 35 minutes  [employer]

## 2015-10-25 NOTE — Progress Notes (Signed)
Hemodialysis completed. 

## 2015-10-25 NOTE — Care Management Important Message (Signed)
Important Message  Patient Details  Name: Tracy CarrowLouvenia Drake MRN: [id number] Date of Birth: 10/27/1945   Medicare Important Message Given:  Yes    Olegario MessierKathy A Leya Paige 10/25/2015, 12:52 PM

## 2015-10-25 NOTE — Progress Notes (Signed)
Hemodialysis start 

## 2015-10-25 NOTE — Progress Notes (Signed)
Pre-hd tx 

## 2015-10-25 NOTE — Progress Notes (Signed)
[facility]  ROUNDING NOTE   Subjective:   Tracy Drake. Unsuccessful attempt at revascularization.  Dialysis for later today.    Objective:  Vital signs in last 24 hours:  Temp:  [97.6 F (36.4 C)-98.8 F (37.1 C)] 98.8 F (37.1 C) (07/13 1053) Pulse Rate:  [59-84] 72 (07/13 1053) Resp:  [8-18] 18 (07/13 1053) BP: (112-145)/(45-66) 112/53 mmHg (07/13 1053) SpO2:  [96 %-100 %] 100 % (07/13 1053) Weight:  [64.636 kg (142 lb 7.9 oz)] 64.636 kg (142 lb 7.9 oz) (07/13 0500)  Weight change: 1.994 kg (4 lb 6.3 oz) Filed Weights   10/23/15 0436 10/23/15 1400 10/25/15 0500  Weight: 62.642 kg (138 lb 1.6 oz) 62.642 kg (138 lb 1.6 oz) 64.636 kg (142 lb 7.9 oz)    Intake/Output: I/O last 3 completed shifts: In: 530.7 [P.O.:144; I.V.:186.7; IV Piggyback:200] Out: -    Intake/Output this shift:     Physical Exam: General: NAD, laying in bed  Head: +hard of hearing  Eyes: Anicteric, PERRL  Neck: Supple, trachea midline  Lungs:  Clear to auscultation  Heart: Regular rate and rhythm  Abdomen:  Soft, nontender,   Extremities: Right toe with gangrenous changes  Neurologic: Oriented to self only  Skin: No lesions  Access: RIJ permcath    Basic Metabolic Panel:  Recent Labs Lab 10/22/15 1240 10/23/15 0655 10/23/15 1118  NA 138 139 139  K 7.1* 5.1 4.7  CL 111 106 103  CO2 10* 20* 24  GLUCOSE 64* 66 87  BUN 143* 69* 73*  CREATININE 14.15* 9.01* 9.51*  CALCIUM 9.7 8.8* 9.0  PHOS  --  4.8*  --     Liver Function Tests:  Recent Labs Lab 10/22/15 1240 10/23/15 0655  AST 13*  --   ALT 12*  --   ALKPHOS 148*  --   BILITOT 0.7  --   PROT 7.0  --   ALBUMIN 3.4* 3.1*   No results for input(s): LIPASE, AMYLASE in the last 168 hours. No results for input(s): AMMONIA in the last 168 hours.  CBC:  Recent Labs Lab 10/22/15 1240 10/23/15 1118 10/24/15 0445 10/25/15 0454  WBC 4.8 4.3 4.5  --   HGB 11.8* 11.6* 11.3*  --   HCT 35.7 34.0* 33.1*   --   MCV 99.9 97.6 97.0  --   PLT 174 167 128* 130*    Cardiac Enzymes:  Recent Labs Lab 10/22/15 2145 10/23/15 0655 10/23/15 1118  TROPONINI <0.03 1.27* <0.03    BNP: Invalid input(s): POCBNP  CBG:  Recent Labs Lab 10/23/15 2037 10/24/15 0806 10/24/15 1156 10/24/15 2044 10/25/15 0732  GLUCAP 92 74 80 84 158*    Microbiology: Results for orders placed or performed during the hospital encounter of 10/22/15  Blood culture (routine x 2)     Status: None (Preliminary result)   Collection Time: 10/22/15  2:15 PM  Result Value Ref Range Status   Specimen Description BLOOD RIGHT ARM  Final   Special Requests BOTTLES DRAWN AEROBIC AND ANAEROBIC 6CC  Final   Culture NO GROWTH 3 DAYS  Final   Report Status PENDING  Incomplete  Blood culture (routine x 2)     Status: None (Preliminary result)   Collection Time: 10/22/15  2:25 PM  Result Value Ref Range Status   Specimen Description BLOOD RIGHT ARM  Final   Special Requests BOTTLES DRAWN AEROBIC AND ANAEROBIC 6CC  Final   Culture NO GROWTH 3 DAYS  Final   Report  Status PENDING  Incomplete  MRSA PCR Screening     Status: None   Collection Time: 10/22/15 10:09 PM  Result Value Ref Range Status   MRSA by PCR NEGATIVE NEGATIVE Final    Comment:        The GeneXpert MRSA Assay (FDA approved for NASAL specimens only), is one component of a comprehensive MRSA colonization surveillance program. It is not intended to diagnose MRSA infection nor to guide or monitor treatment for MRSA infections.     Coagulation Studies:  Recent Labs  10/23/15 1118  LABPROT 14.5  INR 1.11    Urinalysis: No results for input(s): COLORURINE, LABSPEC, PHURINE, GLUCOSEU, HGBUR, BILIRUBINUR, KETONESUR, PROTEINUR, UROBILINOGEN, NITRITE, LEUKOCYTESUR in the last 72 hours.  Invalid input(s): APPERANCEUR    Imaging: No results found.   Medications:     . aspirin EC  81 mg Oral Daily  . atorvastatin  20 mg Oral q1800  . calcium  gluconate 1 GM IV  1 g Intravenous Once  . cinacalcet  90 mg Oral Q supper  . docusate sodium  100 mg Oral BID  . heparin subcutaneous  5,000 Units Subcutaneous Q8H  . lisinopril  10 mg Oral Daily  . metoCLOPramide (REGLAN) injection  5 mg Intravenous Once  . metoprolol tartrate  12.5 mg Oral BID  . piperacillin-tazobactam (ZOSYN)  IV  3.375 g Intravenous Q12H  . senna  1 tablet Oral BID  . sevelamer carbonate  1,600 mg Oral TID WC  . sodium chloride flush  3 mL Intravenous Q12H  . sodium chloride flush  3 mL Intravenous Q12H  . vancomycin  750 mg Intravenous Q T,Th,Sa-HD  . venlafaxine XR  37.5 mg Oral Q breakfast     Assessment/ Plan:  Tracy Drake is a 70 y.o. black female with hypertension, diabetes mellitus type II, hyperlipidemia, thyroid disorder and ESRD on hemodialysis. [place] recently moved from [place] via [place]. Now staying with her sister.   [facility] [address]  RIJ permcath  1. End Stage Renal Disease with hyperkalemia on admission:  - Dialysis for later today  2. Hypertension: at goal.  - home regimen of amlodipine, lisinopril  3. Secondary Hyperparathyroidism: outpatient PTH >1360. Phos and calcium at goal.  - continue sevelamer for phos binding - cinacalcet  4. Anemia of chronic kidney disease:  - no acute indication for epo especially with concern for ischemic leg.   5. Peripheral Vascular disease: with pain of his right toe.  - Tracy for later today. Appreciate vascular and podiatry input.    LOS: 3 Tracy Drake 7/13/201711:15 AM

## 2015-10-26 ENCOUNTER — Inpatient Hospital Stay: Payer: Medicare Other

## 2015-10-26 LAB — GLUCOSE, CAPILLARY
GLUCOSE-CAPILLARY: 101 mg/dL — AB (ref 65–99)
Glucose-Capillary: 82 mg/dL (ref 65–99)
Glucose-Capillary: 69 mg/dL (ref 65–99)

## 2015-10-26 MED ORDER — SODIUM CHLORIDE 0.9 % IV BOLUS (SEPSIS)
250.0000 mL | Freq: Once | INTRAVENOUS | Status: AC
  Administered 2015-10-26: 250 mL via INTRAVENOUS

## 2015-10-26 MED ORDER — HALOPERIDOL LACTATE 5 MG/ML IJ SOLN
5.0000 mg | Freq: Once | INTRAMUSCULAR | Status: AC | PRN
  Administered 2015-10-26: 5 mg via INTRAMUSCULAR
  Filled 2015-10-26: qty 1

## 2015-10-26 MED ORDER — MUPIROCIN 2 % EX OINT
TOPICAL_OINTMENT | Freq: Every day | CUTANEOUS | Status: DC
  Administered 2015-10-28: 10:00:00 via NASAL
  Administered 2015-10-29 – 2015-10-30 (×2): 1 via NASAL
  Administered 2015-11-02 – 2015-11-07 (×5): via NASAL
  Filled 2015-10-26: qty 22

## 2015-10-26 MED ORDER — LORAZEPAM 2 MG/ML IJ SOLN
1.0000 mg | Freq: Once | INTRAMUSCULAR | Status: AC
  Administered 2015-10-26: 1 mg via INTRAVENOUS
  Filled 2015-10-26: qty 1

## 2015-10-26 NOTE — Progress Notes (Signed)
Patient ID: Tracy Drake, female   DOB: 10/07/1945, 70 y.o.   MRN: [id number] [employer] PROGRESS NOTE  Tracy Drake NWG:[id number] DOB: 1946-01-06 DOA: 10/22/2015 PCP: Pcp Not In System  HPI/Subjective: The patient was admitted to the right great toe pain, increased warmth of the skin, ulceration, purulence, concerning for acute osteomyelitis, peripheral vascular disease, patient underwent angiogram, revealing significant, severe peripheral vascular disease, not amenable to surgical intervention, discussed with Dr. Wyn Quaker today. Patient was seen by podiatrist, however, no procedure could have been done due to severe peripheral vascular disease not amenable to surgical intervention at present. Patient complains some pain in the right great toe , blood cultures are negative, MRSA PCR was negative, vancomycin was discontinued, patient remains on  Zosyn.  MRI is pending. Palliative care consult is consulted .  Objective: Filed Vitals:   10/26/15 0739 10/26/15 1002  BP: 117/16 117/54  Pulse: 73 73  Temp: 98.2 F (36.8 C)   Resp:      Filed Weights   10/25/15 0500 10/25/15 1245 10/25/15 1626  Weight: 64.636 kg (142 lb 7.9 oz) 64.636 kg (142 lb 7.9 oz) 63.6 kg (140 lb 3.4 oz)    ROS: Review of Systems  Constitutional: Negative for fever, chills and weight loss.  HENT: Negative for congestion.   Eyes: Negative for blurred vision and double vision.  Respiratory: Negative for cough, sputum production, shortness of breath and wheezing.   Cardiovascular: Negative for chest pain, palpitations, orthopnea, leg swelling and PND.  Gastrointestinal: Negative for nausea, vomiting, abdominal pain, diarrhea, constipation and blood in stool.  Genitourinary: Negative for dysuria, urgency, frequency and hematuria.  Musculoskeletal: Negative for falls.  Neurological: Negative for dizziness, tremors, focal weakness and headaches.  Endo/Heme/Allergies: Does not bruise/bleed easily.   Psychiatric/Behavioral: Negative for depression. The patient does not have insomnia.    Exam: Physical Exam  HENT:  Nose: No mucosal edema.  Mouth/Throat: No oropharyngeal exudate or posterior oropharyngeal edema.  Eyes: Conjunctivae, EOM and lids are normal. Pupils are equal, round, and reactive to light.  Neck: No JVD present. Carotid bruit is not present. No edema present. No thyroid mass and no thyromegaly present.  Cardiovascular: S1 normal and S2 normal.  Exam reveals no gallop.   Murmur heard.  Systolic murmur is present with a grade of 2/6  Pulses:      Dorsalis pedis pulses are 0 on the right side, and 0 on the left side.  Respiratory: No respiratory distress. She has no wheezes. She has no rhonchi. She has no rales.  GI: Soft. Bowel sounds are normal. There is no tenderness.  Musculoskeletal:       Right ankle: She exhibits no swelling.       Left ankle: She exhibits no swelling.  Lymphadenopathy:    She has no cervical adenopathy.  Neurological: She is alert.  Skin: Skin is warm. Nails show no clubbing.  Right first toe with blackish discoloration looks like it's starting to demarcate.  Psychiatric: She has a normal mood and affect.  Patient is sleepy, however, awaken and converses, answers questions appropriately    Data Reviewed: Basic Metabolic Panel:  Recent Labs Lab 10/22/15 1240 10/23/15 0655 10/23/15 1118 10/25/15 1321  NA 138 139 139 136  K 7.1* 5.1 4.7 4.1  CL 111 106 103 95*  CO2 10* 20* 24 29  GLUCOSE 64* 66 87 136*  BUN 143* 69* 73* 42*  CREATININE 14.15* 9.01* 9.51* 7.00*  CALCIUM 9.7 8.8* 9.0 8.9  PHOS  --  4.8*  --  6.9*   Liver Function Tests:  Recent Labs Lab 10/22/15 1240 10/23/15 0655 10/25/15 1321  AST 13*  --   --   ALT 12*  --   --   ALKPHOS 148*  --   --   BILITOT 0.7  --   --   PROT 7.0  --   --   ALBUMIN 3.4* 3.1* 2.7*   CBC:  Recent Labs Lab 10/22/15 1240 10/23/15 1118 10/24/15 0445 10/25/15 0454  10/25/15 1321  WBC 4.8 4.3 4.5  --  4.6  HGB 11.8* 11.6* 11.3*  --  10.3*  HCT 35.7 34.0* 33.1*  --  30.8*  MCV 99.9 97.6 97.0  --  98.2  PLT 174 167 128* 130* 127*   Cardiac Enzymes:  Recent Labs Lab 10/22/15 2145 10/23/15 0655 10/23/15 1118  TROPONINI <0.03 1.27* <0.03    CBG:  Recent Labs Lab 10/24/15 2044 10/25/15 0732 10/25/15 1215 10/25/15 1656 10/26/15 0721  GLUCAP 84 158* 142* 124* 101*    Recent Results (from the past 240 hour(s))  Blood culture (routine x 2)     Status: None (Preliminary result)   Collection Time: 10/22/15  2:15 PM  Result Value Ref Range Status   Specimen Description BLOOD RIGHT ARM  Final   Special Requests BOTTLES DRAWN AEROBIC AND ANAEROBIC 6CC  Final   Culture NO GROWTH 4 DAYS  Final   Report Status PENDING  Incomplete  Blood culture (routine x 2)     Status: None (Preliminary result)   Collection Time: 10/22/15  2:25 PM  Result Value Ref Range Status   Specimen Description BLOOD RIGHT ARM  Final   Special Requests BOTTLES DRAWN AEROBIC AND ANAEROBIC 6CC  Final   Culture NO GROWTH 4 DAYS  Final   Report Status PENDING  Incomplete  MRSA PCR Screening     Status: None   Collection Time: 10/22/15 10:09 PM  Result Value Ref Range Status   MRSA by PCR NEGATIVE NEGATIVE Final    Comment:        The GeneXpert MRSA Assay (FDA approved for NASAL specimens only), is one component of a comprehensive MRSA colonization surveillance program. It is not intended to diagnose MRSA infection nor to guide or monitor treatment for MRSA infections.      Studies: Dg Eye Foreign Body  10/26/2015  CLINICAL DATA:  Metal working/exposure; clearance prior to MRI EXAM: ORBITS FOR FOREIGN BODY - 2 VIEW COMPARISON:  None. FINDINGS: There is no evidence of metallic foreign body within the orbits. No significant bone abnormality identified. IMPRESSION: No evidence of metallic foreign body within the orbits. Electronically Signed   By: Charlett Nose M.D.    On: 10/26/2015 11:20   Dg Abd 1 View  10/26/2015  CLINICAL DATA:  Abdominal pain.  Multiple surgeries EXAM: ABDOMEN - 1 VIEW COMPARISON:  None. FINDINGS: There are multiple surgical clips throughout the abdomen. There is a femoral catheter with the tip overlying L5, likely in the common femoral vein on the left. Bowel gas pattern is normal. No obstruction or free air. There are multiple vascular calcifications in the pelvis and lower abdominal aorta. IMPRESSION: Extensive atherosclerosis. Multiple surgical clips throughout the abdomen. Femoral catheter on the left in the expected location of the left common iliac artery. Bowel gas pattern unremarkable. No demonstrable bowel obstruction or free air. Electronically Signed   By: Bretta Bang III M.D.   On: 10/26/2015 11:19    Scheduled Meds: .  aspirin EC  81 mg Oral Daily  . atorvastatin  20 mg Oral q1800  . calcium gluconate 1 GM IV  1 g Intravenous Once  . cinacalcet  90 mg Oral Q supper  . docusate sodium  100 mg Oral BID  . heparin subcutaneous  5,000 Units Subcutaneous Q8H  . metoprolol tartrate  12.5 mg Oral BID  . mupirocin ointment   Topical BID  . piperacillin-tazobactam (ZOSYN)  IV  3.375 g Intravenous Q12H  . senna  1 tablet Oral BID  . sevelamer carbonate  1,600 mg Oral TID WC  . sodium chloride flush  3 mL Intravenous Q12H  . sodium chloride flush  3 mL Intravenous Q12H  . venlafaxine XR  37.5 mg Oral Q breakfast    Assessment/Plan:  1. Right first toe pain due to Suspected osteomyelitis, ischemia . Continue pain medications, heparin subcutaneously.  X-ray of the toe could not rule out an osteomyelitis , continue  Zosyn, off vancomycin , toe  MRI is ordered, pending. Appreciate podiatry and vascular surgery input, patient underwent angiography July 12, sluggish flow was noted, due to severe peripheral vascular disease, not an amenable to intervention due to patient moving intermittently on procedure table, unable to get great  toe amputated due to severe peripheral vascular disease. Awaiting for great toe MRI results, we'll [facility] advice outpatient antibiotics. Palliative care was consulted,  pending, discussed with care management, discharge planning would be to skilled nursing facility for rehabilitation, possibly early next week 2. Hyperkalemia on presentation secondary to missed hemodialysis sessions. The patient underwent hemodialysis ,  improved potassium , following closely. 3. Elevated troponin.  Patient not having chest pain or shortness of breath. Patient already on aspirin and metoprolol and statin. Cardiology consultation is appreciated, further evaluation of his pharmacologic nuclear stress test was recommended, possibly outpatient, echocardiogram revealed normal ejection fraction, LVH, grade 1 diastolic dysfunction. 4. End-stage renal disease on hemodialysis as per nephrology, patient received few consecutive treatments, next dialysis according to the schedule 5. Anemia of chronic disease, stable 6. Hyperglycemia, Hemoglobin A1c is low patient is not a diabetic 7     Essential hypertension, but now patient had low blood pressure, now off Norvasc, lisinopril. Patient's blood pressure is a little better today, resume medications depending on blood pressure readings   Code Status:     Code Status Orders        Start     Ordered   10/22/15 2049  Full code   Continuous     10/22/15 2048    Code Status History    Date Active Date Inactive Code Status Order ID Comments User Context   This patient has a current code status but no historical code status.     Family Communication:  Disposition Plan: To be determined, may need Adult Protective Services  Consultants:  Cardiology  Nephrology  Podiatry  Vascular surgery  Antibiotics:  Zosyn  Time spent: 30 minutes  [employer],Adeja Sarratt  [employer]

## 2015-10-26 NOTE — Progress Notes (Signed)
Patient Demographics  Tracy Drake, is a 70 y.o. female   MRN: [id number]   DOB - 1945/07/10  Admit Date - 10/22/2015    Outpatient Primary MD for the patient is Pcp Not In System  Consult requested in the Hospital by Katharina Caper, MD, On 10/26/2015    With History of -  Past Medical History  Diagnosis Date  . Diabetes mellitus (HCC)     a. not on medications  . History of stroke   . ESRD (end stage renal disease) on dialysis (HCC)   . Essential hypertension   . HLD (hyperlipidemia)   . Diabetic neuropathy [facility])       Past Surgical History  Procedure Laterality Date  . Peripheral vascular catheterization Right 10/24/2015    Procedure: Lower Extremity Angiography;  Surgeon: Annice Needy, MD;  Location: [facility] INVASIVE CV LAB;  Service: Cardiovascular;  Laterality: Right;    in for   Chief Complaint  Patient presents with  . Toe Pain     HPI  Tracy Drake  is a 70 y.o. female, Chronic wound on the right great toe. Patient has been noncompliant with antibiotics and with dialysis. Has some drainage from the right great toe and possible osteomyelitis to the digit. Underwent angioplasty attempts several days ago but minimal improvement was achieved due to patient movement and severity of disease.    Social History Social History  Substance Use Topics  . Smoking status: Never Smoker   . Smokeless tobacco: Never Used  . Alcohol Use: No     Family History Family History  Problem Relation Age of Onset  . Hypertension Mother     Prior to Admission medications   Medication Sig Start Date End Date Taking? Authorizing Provider  amLODipine (NORVASC) 2.5 MG tablet Take 2.5 mg by mouth daily.   Yes Historical Provider, MD  aspirin EC 81 MG tablet Take 81 mg by mouth daily.   Yes Historical  Provider, MD  atorvastatin (LIPITOR) 20 MG tablet Take 20 mg by mouth daily at 6 PM.   Yes Historical Provider, MD  cinacalcet (SENSIPAR) 90 MG tablet Take 90 mg by mouth daily with supper.   Yes Historical Provider, MD  lisinopril (PRINIVIL,ZESTRIL) 10 MG tablet Take 10 mg by mouth daily.   Yes Historical Provider, MD  sevelamer carbonate (RENVELA) 800 MG tablet Take 1,600 mg by mouth 3 (three) times daily with meals.   Yes Historical Provider, MD  venlafaxine XR (EFFEXOR-XR) 37.5 MG 24 hr capsule Take 37.5 mg by mouth daily with breakfast.   Yes Historical Provider, MD    Anti-infectives    Start     Dose/Rate Route Frequency Ordered Stop   10/23/15 1200  vancomycin (VANCOCIN) IVPB 750 mg/150 ml premix  Status:  Discontinued     750 mg 150 mL/hr over 60 Minutes Intravenous Every T-Th-Sa (Hemodialysis) 10/23/15 1104 10/26/15 0915   10/23/15 0800  vancomycin (VANCOCIN) IVPB 750 mg/150 ml premix     750 mg 150 mL/hr over 60 Minutes Intravenous  Once 10/23/15 0741 10/23/15 1158   10/22/15 2200  piperacillin-tazobactam (ZOSYN) IVPB 3.375 g     3.375 g 12.5 mL/hr over 240 Minutes  Intravenous Every 12 hours 10/22/15 2048     10/22/15 1445  vancomycin (VANCOCIN) IVPB 1000 mg/200 mL premix     1,000 mg 200 mL/hr over 60 Minutes Intravenous  Once 10/22/15 1442 10/22/15 1729   10/22/15 1445  piperacillin-tazobactam (ZOSYN) IVPB 3.375 g     3.375 g 100 mL/hr over 30 Minutes Intravenous  Once 10/22/15 1444 10/22/15 1650      Scheduled Meds: . aspirin EC  81 mg Oral Daily  . atorvastatin  20 mg Oral q1800  . calcium gluconate 1 GM IV  1 g Intravenous Once  . cinacalcet  90 mg Oral Q supper  . docusate sodium  100 mg Oral BID  . heparin subcutaneous  5,000 Units Subcutaneous Q8H  . metoprolol tartrate  12.5 mg Oral BID  . mupirocin ointment   Topical BID  . piperacillin-tazobactam (ZOSYN)  IV  3.375 g Intravenous Q12H  . senna  1 tablet Oral BID  . sevelamer carbonate  1,600 mg Oral TID WC   . sodium chloride flush  3 mL Intravenous Q12H  . sodium chloride flush  3 mL Intravenous Q12H  . venlafaxine XR  37.5 mg Oral Q breakfast   No Known Allergies  Physical Exam  Vitals  Blood pressure 117/54, pulse 73, temperature 98.2 F (36.8 C), temperature source Oral, resp. rate 19, height  (1.575 m), weight 63.6 kg (140 lb 3.4 oz), SpO2 91 %.  Lower Extremity exam:The lateral border of the right hallux nail shows drainage at this timeframe which I will get a culture and sensitivity on. Is no excessive erythema redness but there is continuing worsening of the coloration of the toe likely will become gangrenous at some point. Uncertain as to what level she would have viable circulation at this point. No excessive redness or proximal change to erythema at this juncture. No particular swelling.   Data Review  CBC  Recent Labs Lab 10/22/15 1240 10/23/15 1118 10/24/15 0445 10/25/15 0454 10/25/15 1321  WBC 4.8 4.3 4.5  --  4.6  HGB 11.8* 11.6* 11.3*  --  10.3*  HCT 35.7 34.0* 33.1*  --  30.8*  PLT 174 167 128* 130* 127*  MCV 99.9 97.6 97.0  --  98.2  MCH 33.0 33.4 33.0  --  32.7  MCHC 33.0 34.2 34.0  --  33.4  RDW 13.9 13.8 13.3  --  12.9   ------------------------------------------------------------------------------------------------------------------  Chemistries   Recent Labs Lab 10/22/15 1240 10/23/15 0655 10/23/15 1118 10/25/15 1321  NA 138 139 139 136  K 7.1* 5.1 4.7 4.1  CL 111 106 103 95*  CO2 10* 20* 24 29  GLUCOSE 64* 66 87 136*  BUN 143* 69* 73* 42*  CREATININE 14.15* 9.01* 9.51* 7.00*  CALCIUM 9.7 8.8* 9.0 8.9  AST 13*  --   --   --   ALT 12*  --   --   --   ALKPHOS 148*  --   --   --   BILITOT 0.7  --   --   --    Imaging results:   Dg Eye Foreign Body  10/26/2015  CLINICAL DATA:  Metal working/exposure; clearance prior to MRI EXAM: ORBITS FOR FOREIGN BODY - 2 VIEW COMPARISON:  None. FINDINGS: There is no evidence of metallic foreign  body within the orbits. No significant bone abnormality identified. IMPRESSION: No evidence of metallic foreign body within the orbits. Electronically Signed   By: Charlett Nose M.D.   On: 10/26/2015  11:20   Dg Abd 1 View  10/26/2015  CLINICAL DATA:  Abdominal pain.  Multiple surgeries EXAM: ABDOMEN - 1 VIEW COMPARISON:  None. FINDINGS: There are multiple surgical clips throughout the abdomen. There is a femoral catheter with the tip overlying L5, likely in the common femoral vein on the left. Bowel gas pattern is normal. No obstruction or free air. There are multiple vascular calcifications in the pelvis and lower abdominal aorta. IMPRESSION: Extensive atherosclerosis. Multiple surgical clips throughout the abdomen. Femoral catheter on the left in the expected location of the left common iliac artery. Bowel gas pattern unremarkable. No demonstrable bowel obstruction or free air. Electronically Signed   By: Bretta BangWilliam  Woodruff III M.D.   On: 10/26/2015 11:19    Assessment & Plan: Infection to the right great toe along the lateral border of the nail with possible underlying osteomyelitis. MRI was done today the results are not ready yet. I'll review these tomorrow. The problem is mostly from a vascular standpoint at this juncture. If she has a non-correctable vascular disease on the right lower extremity then there is very little active do at this point to resolve the issue. She would likely not heal from an amputation to the great toe with her current vascular status. I will defer to Dr. dew regarding what her options are with that. Currently I would recommend palliative care with wet-to-dry saline dressing and Bactroban ointment to the wound. If at some point circulation can be improved and established with better vascular flow surgical options would be expanded.  Principal Problem:   Toe osteomyelitis, right (HCC) Active Problems:   ESRD (end stage renal disease) (HCC)   Hyperkalemia   Anemia of chronic  disease   Osteomyelitis (HCC)   Pressure ulcer    Tyreque Finken G M.D on 10/26/2015 at 3:14 PM  Thank you for the consult, we will follow the patient with you in the Hospital.

## 2015-10-26 NOTE — Progress Notes (Signed)
[facility]  ROUNDING NOTE   Subjective:   Hemodialysis treatment yesterday. Tolerated treatment well. UF of 900mL.  Skeletal survery and MRI for today.  She continues to complain of right toe pain.  Eating breakfast when examined this morning.    Objective:  Vital signs in last 24 hours:  Temp:  [98.1 F (36.7 C)-99.4 F (37.4 C)] 98.2 F (36.8 C) (07/14 0739) Pulse Rate:  [60-83] 73 (07/14 1002) Resp:  [10-21] 19 (07/14 0331) BP: (81-160)/(16-65) 117/54 mmHg (07/14 1002) SpO2:  [91 %-100 %] 91 % (07/14 0739) Weight:  [63.6 kg (140 lb 3.4 oz)-64.636 kg (142 lb 7.9 oz)] 63.6 kg (140 lb 3.4 oz) (07/13 1626)  Weight change: 0 kg (0 lb) Filed Weights   10/25/15 0500 10/25/15 1245 10/25/15 1626  Weight: 64.636 kg (142 lb 7.9 oz) 64.636 kg (142 lb 7.9 oz) 63.6 kg (140 lb 3.4 oz)    Intake/Output: I/O last 3 completed shifts: In: 952 [P.O.:502; IV Piggyback:450] Out: 900 [Other:900]   Intake/Output this shift:     Physical Exam: General: NAD, laying in bed  Head: +hard of hearing  Eyes: Anicteric, PERRL  Neck: Supple, trachea midline  Lungs:  Clear to auscultation  Heart: Regular rate and rhythm  Abdomen:  Soft, nontender  Extremities: Right toe with gangrenous changes  Neurologic: Oriented to self and place  Skin: No lesions  Access: RIJ permcath    Basic Metabolic Panel:  Recent Labs Lab 10/22/15 1240 10/23/15 0655 10/23/15 1118 10/25/15 1321  NA 138 139 139 136  K 7.1* 5.1 4.7 4.1  CL 111 106 103 95*  CO2 10* 20* 24 29  GLUCOSE 64* 66 87 136*  BUN 143* 69* 73* 42*  CREATININE 14.15* 9.01* 9.51* 7.00*  CALCIUM 9.7 8.8* 9.0 8.9  PHOS  --  4.8*  --  6.9*    Liver Function Tests:  Recent Labs Lab 10/22/15 1240 10/23/15 0655 10/25/15 1321  AST 13*  --   --   ALT 12*  --   --   ALKPHOS 148*  --   --   BILITOT 0.7  --   --   PROT 7.0  --   --   ALBUMIN 3.4* 3.1* 2.7*   No results for input(s): LIPASE, AMYLASE in the last 168  hours. No results for input(s): AMMONIA in the last 168 hours.  CBC:  Recent Labs Lab 10/22/15 1240 10/23/15 1118 10/24/15 0445 10/25/15 0454 10/25/15 1321  WBC 4.8 4.3 4.5  --  4.6  HGB 11.8* 11.6* 11.3*  --  10.3*  HCT 35.7 34.0* 33.1*  --  30.8*  MCV 99.9 97.6 97.0  --  98.2  PLT 174 167 128* 130* 127*    Cardiac Enzymes:  Recent Labs Lab 10/22/15 2145 10/23/15 0655 10/23/15 1118  TROPONINI <0.03 1.27* <0.03    BNP: Invalid input(s): POCBNP  CBG:  Recent Labs Lab 10/24/15 2044 10/25/15 0732 10/25/15 1215 10/25/15 1656 10/26/15 0721  GLUCAP 84 158* 142* 124* 101*    Microbiology: Results for orders placed or performed during the hospital encounter of 10/22/15  Blood culture (routine x 2)     Status: None (Preliminary result)   Collection Time: 10/22/15  2:15 PM  Result Value Ref Range Status   Specimen Description BLOOD RIGHT ARM  Final   Special Requests BOTTLES DRAWN AEROBIC AND ANAEROBIC 6CC  Final   Culture NO GROWTH 4 DAYS  Final   Report Status PENDING  Incomplete  Blood  culture (routine x 2)     Status: None (Preliminary result)   Collection Time: 10/22/15  2:25 PM  Result Value Ref Range Status   Specimen Description BLOOD RIGHT ARM  Final   Special Requests BOTTLES DRAWN AEROBIC AND ANAEROBIC 6CC  Final   Culture NO GROWTH 4 DAYS  Final   Report Status PENDING  Incomplete  MRSA PCR Screening     Status: None   Collection Time: 10/22/15 10:09 PM  Result Value Ref Range Status   MRSA by PCR NEGATIVE NEGATIVE Final    Comment:        The GeneXpert MRSA Assay (FDA approved for NASAL specimens only), is one component of a comprehensive MRSA colonization surveillance program. It is not intended to diagnose MRSA infection nor to guide or monitor treatment for MRSA infections.     Coagulation Studies:  Recent Labs  10/23/15 1118  LABPROT 14.5  INR 1.11    Urinalysis: No results for input(s): COLORURINE, LABSPEC, PHURINE,  GLUCOSEU, HGBUR, BILIRUBINUR, KETONESUR, PROTEINUR, UROBILINOGEN, NITRITE, LEUKOCYTESUR in the last 72 hours.  Invalid input(s): APPERANCEUR    Imaging: No results found.   Medications:     . aspirin EC  81 mg Oral Daily  . atorvastatin  20 mg Oral q1800  . calcium gluconate 1 GM IV  1 g Intravenous Once  . cinacalcet  90 mg Oral Q supper  . docusate sodium  100 mg Oral BID  . heparin subcutaneous  5,000 Units Subcutaneous Q8H  . metoprolol tartrate  12.5 mg Oral BID  . mupirocin ointment   Topical BID  . piperacillin-tazobactam (ZOSYN)  IV  3.375 g Intravenous Q12H  . senna  1 tablet Oral BID  . sevelamer carbonate  1,600 mg Oral TID WC  . sodium chloride flush  3 mL Intravenous Q12H  . sodium chloride flush  3 mL Intravenous Q12H  . venlafaxine XR  37.5 mg Oral Q breakfast     Assessment/ Plan:  [name] is a 70 y.o. black female with hypertension, diabetes mellitus type II, hyperlipidemia, thyroid disorder and ESRD on hemodialysis. Pt recently moved from [place] via [place]. Now staying with her sister.   [facility] TTS [address]  RIJ permcath  1. End Stage Renal Disease with hyperkalemia on admission: Continue TTS schedule. Next treatment for tomorrow.   2. Hypertension: at goal.  - home regimen of amlodipine, lisinopril  3. Secondary Hyperparathyroidism: outpatient PTH >1360. Phos and calcium at goal.  - continue sevelamer for phos binding - cinacalcet  4. Anemia of chronic kidney disease:  - no acute indication for epo especially with concern for ischemic leg.   5. Peripheral Vascular disease: with pain of his right toe.  - Angiogram not very successful at revascularization. MRI for today. Appreciate vascular and podiatry input.  - Palliative care consulted.    LOS: 4 Tracy Drake 7/14/201710:05 AM

## 2015-10-26 NOTE — Progress Notes (Signed)
MD gave order for MRI of right foot

## 2015-10-26 NOTE — Consult Note (Addendum)
Pharmacy Antibiotic Note  Tracy CarrowLouvenia Drake is a 70 y.o. female admitted on 10/22/2015 with infected right great toe.  Pharmacy has been consulted for vancomycin and zosyn dosing. Pt is ESRD on HD T,Thr,Sat  Plan: Vancomycin d/c per internal medicine. Continue zosyn 3.375 g q 12 hours. IM to consult ID for outpatient abx recommendations.  Height: 5\' 2"  (157.5 cm) Weight: 140 lb 3.4 oz (63.6 kg) IBW/kg (Calculated) : 50.1  Temp (24hrs), Avg:98.5 F (36.9 C), Min:98.1 F (36.7 C), Max:99.4 F (37.4 C)   Recent Labs Lab 10/22/15 1240 10/23/15 0655 10/23/15 1118 10/24/15 0445 10/25/15 0454 10/25/15 1321  WBC 4.8  --  4.3 4.5  --  4.6  CREATININE 14.15* 9.01* 9.51*  --   --  7.00*  VANCORANDOM  --   --   --   --  16  --     Estimated Creatinine Clearance: 6.5 mL/min (by C-G formula based on Cr of 7).    No Known Allergies  Antimicrobials this admission: vancomycin 7/10 >> 7/14 zosyn 7/10 >>   Dose adjustments this admission:   Microbiology results: Recent Results (from the past 240 hour(s))  Blood culture (routine x 2)     Status: None (Preliminary result)   Collection Time: 10/22/15  2:15 PM  Result Value Ref Range Status   Specimen Description BLOOD RIGHT ARM  Final   Special Requests BOTTLES DRAWN AEROBIC AND ANAEROBIC 6CC  Final   Culture NO GROWTH 4 DAYS  Final   Report Status PENDING  Incomplete  Blood culture (routine x 2)     Status: None (Preliminary result)   Collection Time: 10/22/15  2:25 PM  Result Value Ref Range Status   Specimen Description BLOOD RIGHT ARM  Final   Special Requests BOTTLES DRAWN AEROBIC AND ANAEROBIC 6CC  Final   Culture NO GROWTH 4 DAYS  Final   Report Status PENDING  Incomplete  MRSA PCR Screening     Status: None   Collection Time: 10/22/15 10:09 PM  Result Value Ref Range Status   MRSA by PCR NEGATIVE NEGATIVE Final    Comment:        The GeneXpert MRSA Assay (FDA approved for NASAL specimens only), is one component of  a comprehensive MRSA colonization surveillance program. It is not intended to diagnose MRSA infection nor to guide or monitor treatment for MRSA infections.      Thank you for allowing pharmacy to be a part of this patient's care.  Efraim KaufmannMelissa D Maccia 10/26/2015 1:50 PM

## 2015-10-26 NOTE — Progress Notes (Signed)
Dr Seth BakeV is requesting clearance x ray's so the pt can get a MRI.  X rays ordered

## 2015-10-26 NOTE — Progress Notes (Signed)
Per MD patient will not be ready for D/C today or over the weekend. Palliative consult has been ordered. Plan is for patient to D/C to [facility] when stable. Clinical Social Worker (CSW) will continue to follow and assist as needed.   Baker Hughes IncorporatedBailey Paisyn Guercio, LCSW [phone]

## 2015-10-27 ENCOUNTER — Inpatient Hospital Stay: Payer: Medicare Other

## 2015-10-27 LAB — GLUCOSE, CAPILLARY
GLUCOSE-CAPILLARY: 155 mg/dL — AB (ref 65–99)
Glucose-Capillary: 64 mg/dL — ABNORMAL LOW (ref 65–99)
Glucose-Capillary: 123 mg/dL — ABNORMAL HIGH (ref 65–99)

## 2015-10-27 LAB — RENAL FUNCTION PANEL
ALBUMIN: 2.6 g/dL — AB (ref 3.5–5.0)
Anion gap: 8 (ref 5–15)
BUN: 29 mg/dL — AB (ref 6–20)
CHLORIDE: 98 mmol/L — AB (ref 101–111)
CO2: 31 mmol/L (ref 22–32)
CREATININE: 6.12 mg/dL — AB (ref 0.44–1.00)
Calcium: 8.6 mg/dL — ABNORMAL LOW (ref 8.9–10.3)
GFR calc non Af Amer: 6 mL/min — ABNORMAL LOW (ref >60)
GFR, EST AFRICAN AMERICAN: 7 mL/min — AB (ref >60)
GLUCOSE: 115 mg/dL — AB (ref 65–99)
POTASSIUM: 4.1 mmol/L (ref 3.5–5.1)
Phosphorus: 5.7 mg/dL — ABNORMAL HIGH (ref 2.5–4.6)
SODIUM: 137 mmol/L (ref 135–145)

## 2015-10-27 LAB — CBC
HEMATOCRIT: 27.3 % — AB (ref 35.0–47.0)
HEMOGLOBIN: 9.5 g/dL — AB (ref 12.0–16.0)
MCH: 33.8 pg (ref 26.0–34.0)
MCHC: 34.7 g/dL (ref 32.0–36.0)
MCV: 97.4 fL (ref 80.0–100.0)
Platelets: 138 10*3/uL — ABNORMAL LOW (ref 150–440)
RBC: 2.81 MIL/uL — AB (ref 3.80–5.20)
RDW: 13.1 % (ref 11.5–14.5)
WBC: 4.5 10*3/uL (ref 3.6–11.0)

## 2015-10-27 LAB — CULTURE, BLOOD (ROUTINE X 2)
CULTURE: NO GROWTH
Culture: NO GROWTH

## 2015-10-27 MED ORDER — LORAZEPAM 2 MG/ML IJ SOLN: 1.0000 mg | Freq: Once | INTRAMUSCULAR | Status: DC

## 2015-10-27 MED ORDER — HALOPERIDOL LACTATE 5 MG/ML IJ SOLN
5.0000 mg | Freq: Once | INTRAMUSCULAR | Status: AC
Start: 2015-10-27 — End: 2015-10-27
  Administered 2015-10-27: 5 mg via INTRAMUSCULAR
  Filled 2015-10-27: qty 1

## 2015-10-27 MED ORDER — HALOPERIDOL LACTATE 5 MG/ML IJ SOLN: 1.0000 mg | Freq: Once | INTRAMUSCULAR | Status: DC

## 2015-10-27 NOTE — Progress Notes (Signed)
POST DIALYSIS ASSESSMENT 

## 2015-10-27 NOTE — Progress Notes (Signed)
PRE DIALYSIS 

## 2015-10-27 NOTE — Progress Notes (Signed)
PRE DIALYSIS ASSESSMENT 

## 2015-10-27 NOTE — Progress Notes (Signed)
HD COMPLETED  

## 2015-10-27 NOTE — Progress Notes (Signed)
[facility]  ROUNDING NOTE   Subjective:   Seen and examined on hemodialysis. Complains of right toe pain. UF goal of 1 litre.     HEMODIALYSIS FLOWSHEET:  Blood Flow Rate (mL/min): 400 mL/min Arterial Pressure (mmHg): -110 mmHg Venous Pressure (mmHg): 120 mmHg Transmembrane Pressure (mmHg): 50 mmHg Ultrafiltration Rate (mL/min): 500 mL/min Dialysate Flow Rate (mL/min): 600 ml/min Conductivity: Machine : 14 Conductivity: Machine : 14 Dialysis Fluid Bolus: Normal Saline Bolus Amount (mL): 250 mL Dialysate Change: 2K Intra-Hemodialysis Comments: resting with eyes closed     Objective:  Vital signs in last 24 hours:  Temp:  [97.3 F (36.3 C)-98.2 F (36.8 C)] 98.1 F (36.7 C) (07/15 0950) Pulse Rate:  [58-77] 70 (07/15 1100) Resp:  [15-18] 17 (07/15 0950) BP: (88-153)/(29-67) 117/41 mmHg (07/15 1030) SpO2:  [97 %-100 %] 100 % (07/15 1000) Weight:  [63.5 kg (139 lb 15.9 oz)] 63.5 kg (139 lb 15.9 oz) (07/15 0950)  Weight change:  Filed Weights   10/25/15 1245 10/25/15 1626 10/27/15 0950  Weight: 64.636 kg (142 lb 7.9 oz) 63.6 kg (140 lb 3.4 oz) 63.5 kg (139 lb 15.9 oz)    Intake/Output: I/O last 3 completed shifts: In: 223 [P.O.:170; I.V.:3; IV Piggyback:50] Out: -    Intake/Output this shift:     Physical Exam: General: NAD, laying in bed  Head: +hard of hearing  Eyes: Anicteric, PERRL  Neck: Supple, trachea midline  Lungs:  Clear to auscultation  Heart: Regular rate and rhythm  Abdomen:  Soft, nontender  Extremities: Right toe with gangrenous changes  Neurologic: Oriented to self and place  Skin: No lesions  Access: RIJ permcath    Basic Metabolic Panel:  Recent Labs Lab 10/22/15 1240 10/23/15 0655 10/23/15 1118 10/25/15 1321 10/27/15 0951  NA 138 139 139 136 137  K 7.1* 5.1 4.7 4.1 4.1  CL 111 106 103 95* 98*  CO2 10* 20* 24 29 31   GLUCOSE 64* 66 87 136* 115*  BUN 143* 69* 73* 42* 29*  CREATININE 14.15* 9.01* 9.51* 7.00*  6.12*  CALCIUM 9.7 8.8* 9.0 8.9 8.6*  PHOS  --  4.8*  --  6.9* 5.7*    Liver Function Tests:  Recent Labs Lab 10/22/15 1240 10/23/15 0655 10/25/15 1321 10/27/15 0951  AST 13*  --   --   --   ALT 12*  --   --   --   ALKPHOS 148*  --   --   --   BILITOT 0.7  --   --   --   PROT 7.0  --   --   --   ALBUMIN 3.4* 3.1* 2.7* 2.6*   No results for input(s): LIPASE, AMYLASE in the last 168 hours. No results for input(s): AMMONIA in the last 168 hours.  CBC:  Recent Labs Lab 10/22/15 1240 10/23/15 1118 10/24/15 0445 10/25/15 0454 10/25/15 1321 10/27/15 0951  WBC 4.8 4.3 4.5  --  4.6 4.5  HGB 11.8* 11.6* 11.3*  --  10.3* 9.5*  HCT 35.7 34.0* 33.1*  --  30.8* 27.3*  MCV 99.9 97.6 97.0  --  98.2 97.4  PLT 174 167 128* 130* 127* 138*    Cardiac Enzymes:  Recent Labs Lab 10/22/15 2145 10/23/15 0655 10/23/15 1118  TROPONINI <0.03 1.27* <0.03    BNP: Invalid input(s): POCBNP  CBG:  Recent Labs Lab 10/25/15 1656 10/26/15 0721 10/26/15 1637 10/26/15 2119 10/27/15 0723  GLUCAP 124* 101* 82 69 64*    Microbiology:  Results for orders placed or performed during the hospital encounter of 10/22/15  Blood culture (routine x 2)     Status: None   Collection Time: 10/22/15  2:15 PM  Result Value Ref Range Status   Specimen Description BLOOD RIGHT ARM  Final   Special Requests BOTTLES DRAWN AEROBIC AND ANAEROBIC 6CC  Final   Culture NO GROWTH 5 DAYS  Final   Report Status 10/27/2015 FINAL  Final  Blood culture (routine x 2)     Status: None   Collection Time: 10/22/15  2:25 PM  Result Value Ref Range Status   Specimen Description BLOOD RIGHT ARM  Final   Special Requests BOTTLES DRAWN AEROBIC AND ANAEROBIC 6CC  Final   Culture NO GROWTH 5 DAYS  Final   Report Status 10/27/2015 FINAL  Final  MRSA PCR Screening     Status: None   Collection Time: 10/22/15 10:09 PM  Result Value Ref Range Status   MRSA by PCR NEGATIVE NEGATIVE Final    Comment:        The  GeneXpert MRSA Assay (FDA approved for NASAL specimens only), is one component of a comprehensive MRSA colonization surveillance program. It is not intended to diagnose MRSA infection nor to guide or monitor treatment for MRSA infections.   Aerobic/Anaerobic Culture (surgical/deep wound)     Status: None (Preliminary result)   Collection Time: 10/26/15  3:42 PM  Result Value Ref Range Status   Specimen Description Toe R  Final   Special Requests NONE  Final   Gram Stain   Final    RARE WBC PRESENT, PREDOMINANTLY MONONUCLEAR ABUNDANT GRAM POSITIVE COCCI IN PAIRS MODERATE GRAM VARIABLE ROD Performed at [facility]    Culture PENDING  Incomplete   Report Status PENDING  Incomplete    Coagulation Studies: No results for input(s): LABPROT, INR in the last 72 hours.  Urinalysis: No results for input(s): COLORURINE, LABSPEC, PHURINE, GLUCOSEU, HGBUR, BILIRUBINUR, KETONESUR, PROTEINUR, UROBILINOGEN, NITRITE, LEUKOCYTESUR in the last 72 hours.  Invalid input(s): APPERANCEUR    Imaging: Dg Eye Foreign Body  10/26/2015  CLINICAL DATA:  Metal working/exposure; clearance prior to MRI EXAM: ORBITS FOR FOREIGN BODY - 2 VIEW COMPARISON:  None. FINDINGS: There is no evidence of metallic foreign body within the orbits. No significant bone abnormality identified. IMPRESSION: No evidence of metallic foreign body within the orbits. Electronically Signed   By: Charlett Nose M.D.   On: 10/26/2015 11:20   Dg Abd 1 View  10/26/2015  CLINICAL DATA:  Abdominal pain.  Multiple surgeries EXAM: ABDOMEN - 1 VIEW COMPARISON:  None. FINDINGS: There are multiple surgical clips throughout the abdomen. There is a femoral catheter with the tip overlying L5, likely in the common femoral vein on the left. Bowel gas pattern is normal. No obstruction or free air. There are multiple vascular calcifications in the pelvis and lower abdominal aorta. IMPRESSION: Extensive atherosclerosis. Multiple surgical clips  throughout the abdomen. Femoral catheter on the left in the expected location of the left common iliac artery. Bowel gas pattern unremarkable. No demonstrable bowel obstruction or free air. Electronically Signed   By: Bretta Bang III M.D.   On: 10/26/2015 11:19     Medications:     . aspirin EC  81 mg Oral Daily  . atorvastatin  20 mg Oral q1800  . calcium gluconate 1 GM IV  1 g Intravenous Once  . cinacalcet  90 mg Oral Q supper  . docusate sodium  100 mg  Oral BID  . heparin subcutaneous  5,000 Units Subcutaneous Q8H  . metoprolol tartrate  12.5 mg Oral BID  . mupirocin ointment   Topical BID  . mupirocin ointment   Nasal Daily  . piperacillin-tazobactam (ZOSYN)  IV  3.375 g Intravenous Q12H  . senna  1 tablet Oral BID  . sevelamer carbonate  1,600 mg Oral TID WC  . sodium chloride flush  3 mL Intravenous Q12H  . sodium chloride flush  3 mL Intravenous Q12H  . venlafaxine XR  37.5 mg Oral Q breakfast     Assessment/ Plan:  [name] is a 70 y.o. black female with hypertension, diabetes mellitus type II, hyperlipidemia, thyroid disorder and ESRD on hemodialysis. Pt recently moved from [place] via [place]. Now staying with her sister.   [facility] TTS [address]  RIJ permcath  1. End Stage Renal Disease with hyperkalemia on admission: Continue TTS schedule. Seen and examined on hemodialysis treatment. Tolerating treatment well.   2. Hypertension: at goal. Home regimen of amlodipine and lisinopril - currently on metoprolol.  3. Secondary Hyperparathyroidism: outpatient PTH >1360. Phos and calcium at goal.  - continue sevelamer for phos binding - cinacalcet  4. Anemia of chronic kidney disease:  - no acute indication for epo especially with concern for ischemic leg.   5. Peripheral Vascular disease: with pain of his right toe.  - Angiogram not very successful at revascularization.  Appreciate vascular and podiatry input.  - Palliative care  consulted.    LOS: 5 Rueben Kassim 7/15/201711:04 AM

## 2015-10-27 NOTE — Progress Notes (Signed)
HD STARTED  

## 2015-10-27 NOTE — Progress Notes (Signed)
Patient returned from MRI and medication helped them get usable images.

## 2015-10-27 NOTE — Progress Notes (Signed)
Paged and spoke to Dr. Imogene Burnhen regarding patient's MRI. To help patient stay still and relaxed he ordered 1mg  ativan and 5mg  haloperidol IM.  Administered the the haldol and told MRI to call if she needed ativan. I could go administer the dose.

## 2015-10-27 NOTE — Progress Notes (Addendum)
Patient ID: Tracy Drake, female   DOB: 03/17/1946, 70 y.o.   MRN: [id number] [facility] PROGRESS NOTE  Yexalen Deike WUJ:[id number] DOB: 08/15/1945 DOA: 10/22/2015 PCP: Pcp Not In System  HPI/Subjective: The patient was admitted to the right great toe pain, increased warmth of the skin, ulceration, purulence, concerning for acute osteomyelitis, peripheral vascular disease, patient underwent angiogram, revealing significant, severe peripheral vascular disease, not amenable to surgical intervention, discussed with Dr. Wyn Quaker today. Patient was seen by podiatrist, however, no procedure could have been done due to severe peripheral vascular disease not amenable to surgical intervention at present.   Patient complains pain in the right great toe, on HD.  Objective: Filed Vitals:   10/27/15 1250 10/27/15 1300  BP:  133/48  Pulse: 62 63  Temp:  97.8 F (36.6 C)  Resp:  20    Filed Weights   10/25/15 1626 10/27/15 0950 10/27/15 1300  Weight: 140 lb 3.4 oz (63.6 kg) 139 lb 15.9 oz (63.5 kg) 139 lb 1.8 oz (63.1 kg)    ROS: Review of Systems  Constitutional: Negative for fever, chills and weight loss.  HENT: Negative for congestion.   Eyes: Negative for blurred vision and double vision.  Respiratory: Negative for cough, sputum production, shortness of breath and wheezing.   Cardiovascular: Negative for chest pain, palpitations, orthopnea, leg swelling and PND.  Gastrointestinal: Negative for nausea, vomiting, abdominal pain, diarrhea, constipation and blood in stool.  Genitourinary: Negative for dysuria, urgency, frequency and hematuria.  Musculoskeletal: Negative for falls.  right great toe pain. Neurological: Negative for dizziness, tremors, focal weakness and headaches.  Endo/Heme/Allergies: Does not bruise/bleed easily.  Psychiatric/Behavioral: Negative for depression. The patient does not have insomnia.    Exam: Physical Exam  HENT:  Nose: No mucosal edema.  Mouth/Throat:  No oropharyngeal exudate or posterior oropharyngeal edema.  Eyes: Conjunctivae, EOM and lids are normal. Pupils are equal, round, and reactive to light.  Neck: No JVD present. Carotid bruit is not present. No edema present. No thyroid mass and no thyromegaly present.  Cardiovascular: S1 normal and S2 normal.  Exam reveals no gallop.   Murmur heard.  Systolic murmur is present with a grade of 2/6  Pulses:      Dorsalis pedis pulses are present.  Respiratory: No respiratory distress. She has no wheezes. She has no rhonchi. She has no rales.  GI: Soft. Bowel sounds are normal. There is no tenderness.  Musculoskeletal:       Right ankle: She exhibits no swelling.       Left ankle: She exhibits no swelling.  Lymphadenopathy:    She has no cervical adenopathy.  Neurological: She is alert.  Skin: Skin is warm. Nails show no clubbing.  right great toe with ulcer beside nail and black, thickened tissue beside the toenail. Psychiatric: She has a normal mood and affect.  Patient is awaken and alert, answers questions appropriately    Data Reviewed: Basic Metabolic Panel:  Recent Labs Lab 10/22/15 1240 10/23/15 0655 10/23/15 1118 10/25/15 1321 10/27/15 0951  NA 138 139 139 136 137  K 7.1* 5.1 4.7 4.1 4.1  CL 111 106 103 95* 98*  CO2 10* 20* 24 29 31   GLUCOSE 64* 66 87 136* 115*  BUN 143* 69* 73* 42* 29*  CREATININE 14.15* 9.01* 9.51* 7.00* 6.12*  CALCIUM 9.7 8.8* 9.0 8.9 8.6*  PHOS  --  4.8*  --  6.9* 5.7*   Liver Function Tests:  Recent Labs Lab 10/22/15 1240 10/23/15 9562  10/25/15 1321 10/27/15 0951  AST 13*  --   --   --   ALT 12*  --   --   --   ALKPHOS 148*  --   --   --   BILITOT 0.7  --   --   --   PROT 7.0  --   --   --   ALBUMIN 3.4* 3.1* 2.7* 2.6*   CBC:  Recent Labs Lab 10/22/15 1240 10/23/15 1118 10/24/15 0445 10/25/15 0454 10/25/15 1321 10/27/15 0951  WBC 4.8 4.3 4.5  --  4.6 4.5  HGB 11.8* 11.6* 11.3*  --  10.3* 9.5*  HCT 35.7 34.0* 33.1*  --   30.8* 27.3*  MCV 99.9 97.6 97.0  --  98.2 97.4  PLT 174 167 128* 130* 127* 138*   Cardiac Enzymes:  Recent Labs Lab 10/22/15 2145 10/23/15 0655 10/23/15 1118  TROPONINI <0.03 1.27* <0.03    CBG:  Recent Labs Lab 10/25/15 1656 10/26/15 0721 10/26/15 1637 10/26/15 2119 10/27/15 0723  GLUCAP 124* 101* 82 69 64*    Recent Results (from the past 240 hour(s))  Blood culture (routine x 2)     Status: None   Collection Time: 10/22/15  2:15 PM  Result Value Ref Range Status   Specimen Description BLOOD RIGHT ARM  Final   Special Requests BOTTLES DRAWN AEROBIC AND ANAEROBIC 6CC  Final   Culture NO GROWTH 5 DAYS  Final   Report Status 10/27/2015 FINAL  Final  Blood culture (routine x 2)     Status: None   Collection Time: 10/22/15  2:25 PM  Result Value Ref Range Status   Specimen Description BLOOD RIGHT ARM  Final   Special Requests BOTTLES DRAWN AEROBIC AND ANAEROBIC 6CC  Final   Culture NO GROWTH 5 DAYS  Final   Report Status 10/27/2015 FINAL  Final  MRSA PCR Screening     Status: None   Collection Time: 10/22/15 10:09 PM  Result Value Ref Range Status   MRSA by PCR NEGATIVE NEGATIVE Final    Comment:        The GeneXpert MRSA Assay (FDA approved for NASAL specimens only), is one component of a comprehensive MRSA colonization surveillance program. It is not intended to diagnose MRSA infection nor to guide or monitor treatment for MRSA infections.   Aerobic/Anaerobic Culture (surgical/deep wound)     Status: None (Preliminary result)   Collection Time: 10/26/15  3:42 PM  Result Value Ref Range Status   Specimen Description Toe R  Final   Special Requests NONE  Final   Gram Stain   Final    RARE WBC PRESENT, PREDOMINANTLY MONONUCLEAR ABUNDANT GRAM POSITIVE COCCI IN PAIRS MODERATE GRAM VARIABLE ROD [facility]    Culture PENDING  Incomplete   Report Status PENDING  Incomplete     Studies: Dg Eye Foreign Body  10/26/2015  CLINICAL  DATA:  Metal working/exposure; clearance prior to MRI EXAM: ORBITS FOR FOREIGN BODY - 2 VIEW COMPARISON:  None. FINDINGS: There is no evidence of metallic foreign body within the orbits. No significant bone abnormality identified. IMPRESSION: No evidence of metallic foreign body within the orbits. Electronically Signed   By: Charlett NoseKevin  Dover M.D.   On: 10/26/2015 11:20   Dg Abd 1 View  10/26/2015  CLINICAL DATA:  Abdominal pain.  Multiple surgeries EXAM: ABDOMEN - 1 VIEW COMPARISON:  None. FINDINGS: There are multiple surgical clips throughout the abdomen. There is a femoral catheter with  the tip overlying L5, likely in the common femoral vein on the left. Bowel gas pattern is normal. No obstruction or free air. There are multiple vascular calcifications in the pelvis and lower abdominal aorta. IMPRESSION: Extensive atherosclerosis. Multiple surgical clips throughout the abdomen. Femoral catheter on the left in the expected location of the left common iliac artery. Bowel gas pattern unremarkable. No demonstrable bowel obstruction or free air. Electronically Signed   By: Bretta Bang III M.D.   On: 10/26/2015 11:19    Scheduled Meds: . aspirin EC  81 mg Oral Daily  . atorvastatin  20 mg Oral q1800  . calcium gluconate 1 GM IV  1 g Intravenous Once  . cinacalcet  90 mg Oral Q supper  . docusate sodium  100 mg Oral BID  . heparin subcutaneous  5,000 Units Subcutaneous Q8H  . metoprolol tartrate  12.5 mg Oral BID  . mupirocin ointment   Topical BID  . mupirocin ointment   Nasal Daily  . piperacillin-tazobactam (ZOSYN)  IV  3.375 g Intravenous Q12H  . senna  1 tablet Oral BID  . sevelamer carbonate  1,600 mg Oral TID WC  . sodium chloride flush  3 mL Intravenous Q12H  . sodium chloride flush  3 mL Intravenous Q12H  . venlafaxine XR  37.5 mg Oral Q breakfast    Assessment/Plan:  1. Right first toe pain due to Suspected osteomyelitis or ischemia .  Continue pain medications, heparin  subcutaneously.  X-ray of the toe could not rule out an osteomyelitis , continue  Zosyn, off vancomycin , toe  MRI is pending.   Appreciate podiatry and vascular surgery input, patient underwent angiography July 12, sluggish flow was noted, due to severe peripheral vascular disease, not an amenable to intervention due to patient moving intermittently on procedure table, unable to get great toe amputated due to severe peripheral vascular disease. Awaiting for great toe MRI results, we'll discuss with Dr. Redge Gainer infectious disease specialist for outpatient antibiotics. F/u  Palliative care consult.  2. Hyperkalemia on presentation secondary to missed hemodialysis sessions. The patient underwent hemodialysis ,  improved.  3. Elevated troponin.  Patient not having chest pain or shortness of breath. Patient already on aspirin and metoprolol and statin. Cardiology consultation is appreciated, further evaluation of his pharmacologic nuclear stress test was recommended, possibly outpatient, echocardiogram revealed normal ejection fraction, LVH, grade 1 diastolic dysfunction.  4. End-stage renal disease on hemodialysis as per nephrology, patient received few consecutive treatments, next dialysis according to the schedule 5. Anemia of chronic disease, stable 6. Hyperglycemia, BS is normal. Hemoglobin A1c is low patient is not a diabetic. 7     Essential hypertension, BP is in low side, off Norvasc, lisinopril. On lopressor.  I  Discussed with Dr. Wynelle Link.  Code Status:     Code Status Orders        Start     Ordered   10/22/15 2049  Full code   Continuous     10/22/15 2048    Code Status History    Date Active Date Inactive Code Status Order ID Comments User Context   This patient has a current code status but no historical code status.     Family Communication:  Disposition Plan: Plan is for patient to D/C to [facility] when stable. Clinical Social Worker (CSW) will continue to  follow and assist as needed.   Consultants:  Cardiology  Nephrology  Podiatry  Vascular surgery  Antibiotics:  Zosyn  Time spent: 30 minutes  Shaune Pollack  [employer]

## 2015-10-28 LAB — GLUCOSE, CAPILLARY
GLUCOSE-CAPILLARY: 79 mg/dL (ref 65–99)
Glucose-Capillary: 84 mg/dL (ref 65–99)
Glucose-Capillary: 83 mg/dL (ref 65–99)
Glucose-Capillary: 100 mg/dL — ABNORMAL HIGH (ref 65–99)

## 2015-10-28 NOTE — Progress Notes (Signed)
Patient ID: Tracy Drake, female   DOB: 1946/01/09, 70 y.o.   MRN: [id number] [facility] PROGRESS NOTE  Tracy Drake JYN:[id number] DOB: 1945/08/17 DOA: 10/22/2015 PCP: Pcp Not In System  HPI/Subjective: The patient was admitted to the right great toe pain, increased warmth of the skin, ulceration, purulence, concerning for acute osteomyelitis, peripheral vascular disease, patient underwent angiogram, revealing significant, severe peripheral vascular disease, not amenable to surgical intervention, discussed with Dr. Wyn Drake today. Patient was seen by podiatrist, however, no procedure could have been done due to severe peripheral vascular disease not amenable to surgical intervention at present.   Patient complains pain in the right great toe.  Objective: Filed Vitals:   10/28/15 0359 10/28/15 0707  BP: 125/54 150/44  Pulse: 64 68  Temp:  98.4 F (36.9 C)  Resp:  18    Filed Weights   10/27/15 0950 10/27/15 1300 10/28/15 0438  Weight: 139 lb 15.9 oz (63.5 kg) 139 lb 1.8 oz (63.1 kg) 137 lb 3.2 oz (62.234 kg)    ROS: Review of Systems  Constitutional: Negative for fever, chills and weight loss.  HENT: Negative for congestion.   Eyes: Negative for blurred vision and double vision.  Respiratory: Negative for cough, sputum production, shortness of breath and wheezing.   Cardiovascular: Negative for chest pain, palpitations, orthopnea, leg swelling and PND.  Gastrointestinal: Negative for nausea, vomiting, abdominal pain, diarrhea, constipation and blood in stool.  Genitourinary: Negative for dysuria, urgency, frequency and hematuria.  Musculoskeletal: Negative for falls.  right great toe pain. Neurological: Negative for dizziness, tremors, focal weakness and headaches.  Endo/Heme/Allergies: Does not bruise/bleed easily.  Psychiatric/Behavioral: Negative for depression. The patient does not have insomnia.    Exam: Physical Exam  HENT:  Nose: No mucosal edema.  Mouth/Throat:  No oropharyngeal exudate or posterior oropharyngeal edema.  Eyes: Conjunctivae, EOM and lids are normal. Pupils are equal, round, and reactive to light.  Neck: No JVD present. Carotid bruit is not present. No edema present. No thyroid mass and no thyromegaly present.  Cardiovascular: S1 normal and S2 normal.  Exam reveals no gallop.   Murmur heard.  Systolic murmur is present with a grade of 2/6  Pulses:      Dorsalis pedis pulses are present.  Respiratory: No respiratory distress. She has no wheezes. She has no rhonchi. She has no rales.  GI: Soft. Bowel sounds are normal. There is no tenderness.  Musculoskeletal:       Right ankle: She exhibits no swelling.       Left ankle: She exhibits no swelling.  Lymphadenopathy:    She has no cervical adenopathy.  Neurological: She is alert.  Skin: Skin is warm. Nails show no clubbing.  right great toe with ulcer beside nail and black, thickened tissue beside the toenail. Psychiatric: She has a normal mood and affect.  Patient is awaken and alert, answers questions appropriately    Data Reviewed: Basic Metabolic Panel:  Recent Labs Lab 10/22/15 1240 10/23/15 0655 10/23/15 1118 10/25/15 1321 10/27/15 0951  NA 138 139 139 136 137  K 7.1* 5.1 4.7 4.1 4.1  CL 111 106 103 95* 98*  CO2 10* 20* 24 29 31   GLUCOSE 64* 66 87 136* 115*  BUN 143* 69* 73* 42* 29*  CREATININE 14.15* 9.01* 9.51* 7.00* 6.12*  CALCIUM 9.7 8.8* 9.0 8.9 8.6*  PHOS  --  4.8*  --  6.9* 5.7*   Liver Function Tests:  Recent Labs Lab 10/22/15 1240 10/23/15 0655 10/25/15 1321  10/27/15 0951  AST 13*  --   --   --   ALT 12*  --   --   --   ALKPHOS 148*  --   --   --   BILITOT 0.7  --   --   --   PROT 7.0  --   --   --   ALBUMIN 3.4* 3.1* 2.7* 2.6*   CBC:  Recent Labs Lab 10/22/15 1240 10/23/15 1118 10/24/15 0445 10/25/15 0454 10/25/15 1321 10/27/15 0951  WBC 4.8 4.3 4.5  --  4.6 4.5  HGB 11.8* 11.6* 11.3*  --  10.3* 9.5*  HCT 35.7 34.0* 33.1*  --   30.8* 27.3*  MCV 99.9 97.6 97.0  --  98.2 97.4  PLT 174 167 128* 130* 127* 138*   Cardiac Enzymes:  Recent Labs Lab 10/22/15 2145 10/23/15 0655 10/23/15 1118  TROPONINI <0.03 1.27* <0.03    CBG:  Recent Labs Lab 10/27/15 0723 10/27/15 1638 10/27/15 2041 10/28/15 0716 10/28/15 1102  GLUCAP 64* 155* 123* 84 83    Recent Results (from the past 240 hour(s))  Blood culture (routine x 2)     Status: None   Collection Time: 10/22/15  2:15 PM  Result Value Ref Range Status   Specimen Description BLOOD RIGHT ARM  Final   Special Requests BOTTLES DRAWN AEROBIC AND ANAEROBIC 6CC  Final   Culture NO GROWTH 5 DAYS  Final   Report Status 10/27/2015 FINAL  Final  Blood culture (routine x 2)     Status: None   Collection Time: 10/22/15  2:25 PM  Result Value Ref Range Status   Specimen Description BLOOD RIGHT ARM  Final   Special Requests BOTTLES DRAWN AEROBIC AND ANAEROBIC 6CC  Final   Culture NO GROWTH 5 DAYS  Final   Report Status 10/27/2015 FINAL  Final  MRSA PCR Screening     Status: None   Collection Time: 10/22/15 10:09 PM  Result Value Ref Range Status   MRSA by PCR NEGATIVE NEGATIVE Final    Comment:        The GeneXpert MRSA Assay (FDA approved for NASAL specimens only), is one component of a comprehensive MRSA colonization surveillance program. It is not intended to diagnose MRSA infection nor to guide or monitor treatment for MRSA infections.   Aerobic/Anaerobic Culture (surgical/deep wound)     Status: None (Preliminary result)   Collection Time: 10/26/15  3:42 PM  Result Value Ref Range Status   Specimen Description TOE RIGHT  Final   Special Requests NONE  Final   Gram Stain   Final    RARE WBC PRESENT, PREDOMINANTLY MONONUCLEAR ABUNDANT GRAM POSITIVE COCCI IN PAIRS MODERATE GRAM VARIABLE ROD    Culture   Final    CULTURE REINCUBATED FOR BETTER GROWTH Performed at [facility]    Report Status PENDING  Incomplete     Studies: Mr Foot  Right Wo Contrast  10/27/2015  CLINICAL DATA:  Patient had a nail taken off in the emergency room about 3 months ago. Over the past 3 or 4 months she's been having significant pains in the right toe and not able to walk, she admits of weakness, chills. MRI OF THE RIGHT FOREFOOT WITHOUT CONTRAST TECHNIQUE: Multiplanar, multisequence MR imaging was performed. No intravenous contrast was administered. COMPARISON:  None. FINDINGS: Bones/Joint/Cartilage Marrow edema within the first distal phalanx with corresponding T1 hypointense signal and irregularity along the distal dorsal tip of the first distal phalanx just under  the nail bed concerning for osteomyelitis of the first distal phalanx. No other marrow signal abnormality. No acute fracture or dislocation. Normal alignment. No joint effusion. Ligaments Collateral ligaments are intact. Tendons Flexor, peroneal and extensor compartment tendons are intact. Muscles Normal. Soft tissue No fluid collection or hematoma.  No soft tissue mass. IMPRESSION: Findings concerning for osteomyelitis of the first distal phalanx of the right foot. No drainable fluid collection to suggest an abscess. Electronically Signed   By: Elige KoHetal  Patel   On: 10/27/2015 16:27    Scheduled Meds: . aspirin EC  81 mg Oral Daily  . atorvastatin  20 mg Oral q1800  . calcium gluconate 1 GM IV  1 g Intravenous Once  . cinacalcet  90 mg Oral Q supper  . docusate sodium  100 mg Oral BID  . heparin subcutaneous  5,000 Units Subcutaneous Q8H  . metoprolol tartrate  12.5 mg Oral BID  . mupirocin ointment   Topical BID  . mupirocin ointment   Nasal Daily  . piperacillin-tazobactam (ZOSYN)  IV  3.375 g Intravenous Q12H  . senna  1 tablet Oral BID  . sevelamer carbonate  1,600 mg Oral TID WC  . sodium chloride flush  3 mL Intravenous Q12H  . sodium chloride flush  3 mL Intravenous Q12H  . venlafaxine XR  37.5 mg Oral Q breakfast    Assessment/Plan:  1. Right first toe pain due to Suspected  osteomyelitis or ischemia .  Continue pain medications. She was on heparin subcutaneously.  X-ray of the toe could not rule out an osteomyelitis. MRI report possible osteomyelitis.  continue  Zosyn, off vancomycin.  Dr. Orland Jarredroxler will speak with vascular surgeon tomorrow about potential future plans what direction he would recommend. Continue wet-to-dry saline dressing. Still await cultures on her great toe.  PVD. The patient underwent angiography July 12, sluggish flow was noted, due to severe peripheral vascular disease, not an amenable to intervention due to patient moving intermittently on procedure table, unable to get great toe amputated due to severe peripheral vascular disease.  Per Dr. Evie LacksEsco, vascular surgeon, pt has limited options for revascularization at this point. If patient has uncontrolled pain and clear evidence of osteomyelitis consideration of reattempt under general anesthesia with endovascular therapy although likelihood of success is poor otherwise would recommend AKA.  2. Hyperkalemia on presentation secondary to missed hemodialysis sessions. The patient underwent hemodialysis ,  improved.  3. Elevated troponin.  Patient not having chest pain or shortness of breath. Patient already on aspirin and metoprolol and statin. Cardiology consultation is appreciated, further evaluation of his pharmacologic nuclear stress test was recommended, possibly outpatient, echocardiogram revealed normal ejection fraction, LVH, grade 1 diastolic dysfunction.  4. End-stage renal disease. continue hemodialysis as per nephrology. 5. Anemia of chronic disease, stable 6. Hyperglycemia, BS is normal. Hemoglobin A1c is low patient is not a diabetic. 7     Essential hypertension, BP is in low side, off Norvasc, lisinopril. On lopressor.  F/u  Palliative care consult. I  Discussed with Dr. Evie LacksEsco  Code Status:     Code Status Orders        Start     Ordered   10/22/15 2049  Full code   Continuous      10/22/15 2048    Code Status History    Date Active Date Inactive Code Status Order ID Comments User Context   This patient has a current code status but no historical code status.     Family  Communication: I discussed with her son. Disposition Plan: Plan is for patient to D/C to [facility] when stable. Clinical Social Worker (CSW) will continue to follow and assist as needed.   Consultants:  Cardiology  Nephrology  Podiatry  Vascular surgery  Antibiotics:  Zosyn  Time spent: 36 minutes  Shaune Pollack  [employer]

## 2015-10-28 NOTE — Progress Notes (Signed)
Vascular Surgery  Discussed patient with Dr. Imogene Burnhen. She has limited options for revascularization at this point. If patient has uncontrolled pain and clear evidence of osteomyelitis consideration of reattempt under general anesthesia with endovascular therapy although likelihood of success is poor otherwise would recommend AKA.  Will discuss with Dr. Wyn Quakerew.

## 2015-10-28 NOTE — Progress Notes (Signed)
Patient Demographics  Tracy Drake, is a 70 y.o. female   MRN: [id number]   DOB - January 10, 1946  Admit Date - 10/22/2015    Outpatient Primary MD for the patient is Pcp Not In System  Consult requested in the Hospital by Shaune Pollack, MD, On 10/28/2015     With History of -  Past Medical History  Diagnosis Date  . Diabetes mellitus (HCC)     a. not on medications  . History of stroke   . ESRD (end stage renal disease) on dialysis (HCC)   . Essential hypertension   . HLD (hyperlipidemia)   . Diabetic neuropathy [facility])       Past Surgical History  Procedure Laterality Date  . Peripheral vascular catheterization Right 10/24/2015    Procedure: Lower Extremity Angiography;  Surgeon: Annice Needy, MD;  Location: [facility] INVASIVE CV LAB;  Service: Cardiovascular;  Laterality: Right;    i  Chief Complaint  Patient presents with  . Toe Pain     HPI  Tracy Drake  is a 70 y.o. female, History of pain and draining from the right great toe. She had attempts at revascularization right lower extremity which were not particularly successful. Patient still has pain with right great toe at this juncture. She's had the wound for several months and failed take antibiotics even though they were prescribed and she was strongly urged to utilize them in May when she saw Dr. Linus Galas in the office.   Social History Social History  Substance Use Topics  . Smoking status: Never Smoker   . Smokeless tobacco: Never Used  . Alcohol Use: No     Family History Family History  Problem Relation Age of Onset  . Hypertension Mother      Prior to Admission medications   Medication Sig Start Date End Date Taking? Authorizing Provider  amLODipine (NORVASC) 2.5 MG tablet Take 2.5 mg by mouth daily.   Yes Historical Provider, MD  aspirin EC 81 MG tablet  Take 81 mg by mouth daily.   Yes Historical Provider, MD  atorvastatin (LIPITOR) 20 MG tablet Take 20 mg by mouth daily at 6 PM.   Yes Historical Provider, MD  cinacalcet (SENSIPAR) 90 MG tablet Take 90 mg by mouth daily with supper.   Yes Historical Provider, MD  lisinopril (PRINIVIL,ZESTRIL) 10 MG tablet Take 10 mg by mouth daily.   Yes Historical Provider, MD  sevelamer carbonate (RENVELA) 800 MG tablet Take 1,600 mg by mouth 3 (three) times daily with meals.   Yes Historical Provider, MD  venlafaxine XR (EFFEXOR-XR) 37.5 MG 24 hr capsule Take 37.5 mg by mouth daily with breakfast.   Yes Historical Provider, MD    Anti-infectives    Start     Dose/Rate Route Frequency Ordered Stop   10/23/15 1200  vancomycin (VANCOCIN) IVPB 750 mg/150 ml premix  Status:  Discontinued     750 mg 150 mL/hr over 60 Minutes Intravenous Every T-Th-Sa (Hemodialysis) 10/23/15 1104 10/26/15 0915   10/23/15 0800  vancomycin (VANCOCIN) IVPB 750 mg/150 ml premix     750 mg 150 mL/hr over 60 Minutes Intravenous  Once 10/23/15 0741 10/23/15 1158  10/22/15 2200  piperacillin-tazobactam (ZOSYN) IVPB 3.375 g     3.375 g 12.5 mL/hr over 240 Minutes Intravenous Every 12 hours 10/22/15 2048     10/22/15 1445  vancomycin (VANCOCIN) IVPB 1000 mg/200 mL premix     1,000 mg 200 mL/hr over 60 Minutes Intravenous  Once 10/22/15 1442 10/22/15 1729   10/22/15 1445  piperacillin-tazobactam (ZOSYN) IVPB 3.375 g     3.375 g 100 mL/hr over 30 Minutes Intravenous  Once 10/22/15 1444 10/22/15 1650      Scheduled Meds: . aspirin EC  81 mg Oral Daily  . atorvastatin  20 mg Oral q1800  . calcium gluconate 1 GM IV  1 g Intravenous Once  . cinacalcet  90 mg Oral Q supper  . docusate sodium  100 mg Oral BID  . heparin subcutaneous  5,000 Units Subcutaneous Q8H  . metoprolol tartrate  12.5 mg Oral BID  . mupirocin ointment   Topical BID  . mupirocin ointment   Nasal Daily  . piperacillin-tazobactam (ZOSYN)  IV  3.375 g Intravenous  Q12H  . senna  1 tablet Oral BID  . sevelamer carbonate  1,600 mg Oral TID WC  . sodium chloride flush  3 mL Intravenous Q12H  . sodium chloride flush  3 mL Intravenous Q12H  . venlafaxine XR  37.5 mg Oral Q breakfast   Continuous Infusions:  PRN Meds:.sodium chloride, acetaminophen **OR** acetaminophen, HYDROcodone-acetaminophen, morphine injection, ondansetron **OR** ondansetron (ZOFRAN) IV, polyethylene glycol, sodium chloride flush  No Known Allergies  Physical Exam: Patient is fairly alert at this point and communicative.  Vitals  Blood pressure 150/44, pulse 68, temperature 98.4 F (36.9 C), temperature source Oral, resp. rate 18, height  (1.575 m), weight 62.234 kg (137 lb 3.2 oz), SpO2 100 %.  Lower Extremity exam: Exam today shows still some drainage from the lateral aspect of the periungual region on the right hallux. Still discoloration to the toe and discoloration to the foot as a whole as compared to the left foot. Data Review  CBC  Recent Labs Lab 10/22/15 1240 10/23/15 1118 10/24/15 0445 10/25/15 0454 10/25/15 1321 10/27/15 0951  WBC 4.8 4.3 4.5  --  4.6 4.5  HGB 11.8* 11.6* 11.3*  --  10.3* 9.5*  HCT 35.7 34.0* 33.1*  --  30.8* 27.3*  PLT 174 167 128* 130* 127* 138*  MCV 99.9 97.6 97.0  --  98.2 97.4  MCH 33.0 33.4 33.0  --  32.7 33.8  MCHC 33.0 34.2 34.0  --  33.4 34.7  RDW 13.9 13.8 13.3  --  12.9 13.1   ------------------------------------------------------------------------------------------------------------------  Chemistries   Recent Labs Lab 10/22/15 1240 10/23/15 0655 10/23/15 1118 10/25/15 1321 10/27/15 0951  NA 138 139 139 136 137  K 7.1* 5.1 4.7 4.1 4.1  CL 111 106 103 95* 98*  CO2 10* 20* GLUCOSE 64* 66 87 136* 115*  BUN 143* 69* 73* 42* 29*  CREATININE 14.15* 9.01* 9.51* 7.00* 6.12*  CALCIUM 9.7 8.8* 9.0 8.9 8.6*  AST 13*  --   --   --   --   ALT 12*  --   --   --   --   ALKPHOS 148*  --   --   --   --    BILITOT 0.7  --   --   --   --    ------Imaging results: MRI shows increased signal to the distal phalanx of the right hallux consistent with  osteomyelitis. This is consistent with clinical findings as well.  Dg Eye Foreign Body  10/26/2015  CLINICAL DATA:  Metal working/exposure; clearance prior to MRI EXAM: ORBITS FOR FOREIGN BODY - 2 VIEW COMPARISON:  None. FINDINGS: There is no evidence of metallic foreign body within the orbits. No significant bone abnormality identified. IMPRESSION: No evidence of metallic foreign body within the orbits. Electronically Signed   By: Charlett NoseKevin  Dover M.D.   On: 10/26/2015 11:20   Dg Abd 1 View  10/26/2015  CLINICAL DATA:  Abdominal pain.  Multiple surgeries EXAM: ABDOMEN - 1 VIEW COMPARISON:  None. FINDINGS: There are multiple surgical clips throughout the abdomen. There is a femoral catheter with the tip overlying L5, likely in the common femoral vein on the left. Bowel gas pattern is normal. No obstruction or free air. There are multiple vascular calcifications in the pelvis and lower abdominal aorta. IMPRESSION: Extensive atherosclerosis. Multiple surgical clips throughout the abdomen. Femoral catheter on the left in the expected location of the left common iliac artery. Bowel gas pattern unremarkable. No demonstrable bowel obstruction or free air. Electronically Signed   By: Bretta BangWilliam  Woodruff III M.D.   On: 10/26/2015 11:19   Mr Foot Right Wo Contrast  10/27/2015  CLINICAL DATA:  Patient had a nail taken off in the emergency room about 3 months ago. Over the past 3 or 4 months she's been having significant pains in the right toe and not able to walk, she admits of weakness, chills. MRI OF THE RIGHT FOREFOOT WITHOUT CONTRAST TECHNIQUE: Multiplanar, multisequence MR imaging was performed. No intravenous contrast was administered. COMPARISON:  None. FINDINGS: Bones/Joint/Cartilage Marrow edema within the first distal phalanx with corresponding T1 hypointense signal and  irregularity along the distal dorsal tip of the first distal phalanx just under the nail bed concerning for osteomyelitis of the first distal phalanx. No other marrow signal abnormality. No acute fracture or dislocation. Normal alignment. No joint effusion. Ligaments Collateral ligaments are intact. Tendons Flexor, peroneal and extensor compartment tendons are intact. Muscles Normal. Soft tissue No fluid collection or hematoma.  No soft tissue mass. IMPRESSION: Findings concerning for osteomyelitis of the first distal phalanx of the right foot. No drainable fluid collection to suggest an abscess. Electronically Signed   By: Elige KoHetal  Patel   On: 10/27/2015 16:27     Assessment & Plan: Likely osteomyelitis to the distal phalanx of the right hallux. Plan: Patient has poor prognosis due to lack of arterial flow. I will speak with Dr. Festus BarrenJason Dew tomorrow regarding future plans for her circulation. We could attempt to debride the wound but I'm afraid any debridement would result in a lack of healing. Also concerned about any type of partial or total left great toe amputation leading to a lack of healing as well as a result of her poor vascular status to the right lower extremity. I'll speak with vascular surgeon tomorrow about potential future plans what direction he would recommend. Continue wet-to-dry saline dressing. Still await cultures on her great toe.  Principal Problem:   Toe osteomyelitis, right (HCC) Active Problems:   ESRD (end stage renal disease) (HCC)   Hyperkalemia   Anemia of chronic disease   Osteomyelitis (HCC)   Pressure ulcer     Family Communication: Plan discussed with patient and **   Thank you for the consult, we will follow the patient with you in the Hospital.   Epimenio SarinROXLER,Tori Cupps G M.D on 10/28/2015 at 10:39 AM  Thank you for the consult, we will  follow the patient with you in the Hospital.

## 2015-10-28 NOTE — Progress Notes (Signed)
[facility]  ROUNDING NOTE   Subjective:   Hemodialysis treatment yesterday. Tolerated treatment, UF 0.5.   Son at bedside.   Continues to complain of right toe pain.     Objective:  Vital signs in last 24 hours:  Temp:  [97.8 F (36.6 C)-98.7 F (37.1 C)] 98.4 F (36.9 C) (07/16 0707) Pulse Rate:  [50-79] 68 (07/16 0707) Resp:  [16-20] 18 (07/16 0707) BP: (96-150)/(41-63) 150/44 mmHg (07/16 0707) SpO2:  [97 %-100 %] 100 % (07/16 0707) Weight:  [62.234 kg (137 lb 3.2 oz)-63.1 kg (139 lb 1.8 oz)] 62.234 kg (137 lb 3.2 oz) (07/16 0438)  Weight change:  Filed Weights   10/27/15 0950 10/27/15 1300 10/28/15 0438  Weight: 63.5 kg (139 lb 15.9 oz) 63.1 kg (139 lb 1.8 oz) 62.234 kg (137 lb 3.2 oz)    Intake/Output: I/O last 3 completed shifts: In: 223 [P.O.:170; I.V.:3; IV Piggyback:50] Out: 500 [Other:500]   Intake/Output this shift:     Physical Exam: General: NAD, laying in bed  Head: +hard of hearing  Eyes: Anicteric, PERRL  Neck: Supple, trachea midline  Lungs:  Clear to auscultation  Heart: Regular rate and rhythm  Abdomen:  Soft, nontender  Extremities: Right toe with gangrenous changes  Neurologic: Oriented to self and place  Skin: No lesions  Access: RIJ permcath    Basic Metabolic Panel:  Recent Labs Lab 10/22/15 1240 10/23/15 0655 10/23/15 1118 10/25/15 1321 10/27/15 0951  NA 138 139 139 136 137  K 7.1* 5.1 4.7 4.1 4.1  CL 111 106 103 95* 98*  CO2 10* 20* 24 29 31   GLUCOSE 64* 66 87 136* 115*  BUN 143* 69* 73* 42* 29*  CREATININE 14.15* 9.01* 9.51* 7.00* 6.12*  CALCIUM 9.7 8.8* 9.0 8.9 8.6*  PHOS  --  4.8*  --  6.9* 5.7*    Liver Function Tests:  Recent Labs Lab 10/22/15 1240 10/23/15 0655 10/25/15 1321 10/27/15 0951  AST 13*  --   --   --   ALT 12*  --   --   --   ALKPHOS 148*  --   --   --   BILITOT 0.7  --   --   --   PROT 7.0  --   --   --   ALBUMIN 3.4* 3.1* 2.7* 2.6*   No results for input(s): LIPASE,  AMYLASE in the last 168 hours. No results for input(s): AMMONIA in the last 168 hours.  CBC:  Recent Labs Lab 10/22/15 1240 10/23/15 1118 10/24/15 0445 10/25/15 0454 10/25/15 1321 10/27/15 0951  WBC 4.8 4.3 4.5  --  4.6 4.5  HGB 11.8* 11.6* 11.3*  --  10.3* 9.5*  HCT 35.7 34.0* 33.1*  --  30.8* 27.3*  MCV 99.9 97.6 97.0  --  98.2 97.4  PLT 174 167 128* 130* 127* 138*    Cardiac Enzymes:  Recent Labs Lab 10/22/15 2145 10/23/15 0655 10/23/15 1118  TROPONINI <0.03 1.27* <0.03    BNP: Invalid input(s): POCBNP  CBG:  Recent Labs Lab 10/26/15 2119 10/27/15 0723 10/27/15 1638 10/27/15 2041 10/28/15 0716  GLUCAP 69 64* 155* 123* 84    Microbiology: Results for orders placed or performed during the hospital encounter of 10/22/15  Blood culture (routine x 2)     Status: None   Collection Time: 10/22/15  2:15 PM  Result Value Ref Range Status   Specimen Description BLOOD RIGHT ARM  Final   Special Requests BOTTLES DRAWN AEROBIC AND  ANAEROBIC 6CC  Final   Culture NO GROWTH 5 DAYS  Final   Report Status 10/27/2015 FINAL  Final  Blood culture (routine x 2)     Status: None   Collection Time: 10/22/15  2:25 PM  Result Value Ref Range Status   Specimen Description BLOOD RIGHT ARM  Final   Special Requests BOTTLES DRAWN AEROBIC AND ANAEROBIC 6CC  Final   Culture NO GROWTH 5 DAYS  Final   Report Status 10/27/2015 FINAL  Final  MRSA PCR Screening     Status: None   Collection Time: 10/22/15 10:09 PM  Result Value Ref Range Status   MRSA by PCR NEGATIVE NEGATIVE Final    Comment:        The GeneXpert MRSA Assay (FDA approved for NASAL specimens only), is one component of a comprehensive MRSA colonization surveillance program. It is not intended to diagnose MRSA infection nor to guide or monitor treatment for MRSA infections.   Aerobic/Anaerobic Culture (surgical/deep wound)     Status: None (Preliminary result)   Collection Time: 10/26/15  3:42 PM  Result  Value Ref Range Status   Specimen Description TOE RIGHT  Final   Special Requests NONE  Final   Gram Stain   Final    RARE WBC PRESENT, PREDOMINANTLY MONONUCLEAR ABUNDANT GRAM POSITIVE COCCI IN PAIRS MODERATE GRAM VARIABLE ROD    Culture   Final    CULTURE REINCUBATED FOR BETTER GROWTH Performed at [facility]    Report Status PENDING  Incomplete    Coagulation Studies: No results for input(s): LABPROT, INR in the last 72 hours.  Urinalysis: No results for input(s): COLORURINE, LABSPEC, PHURINE, GLUCOSEU, HGBUR, BILIRUBINUR, KETONESUR, PROTEINUR, UROBILINOGEN, NITRITE, LEUKOCYTESUR in the last 72 hours.  Invalid input(s): APPERANCEUR    Imaging: Dg Eye Foreign Body  10/26/2015  CLINICAL DATA:  Metal working/exposure; clearance prior to MRI EXAM: ORBITS FOR FOREIGN BODY - 2 VIEW COMPARISON:  None. FINDINGS: There is no evidence of metallic foreign body within the orbits. No significant bone abnormality identified. IMPRESSION: No evidence of metallic foreign body within the orbits. Electronically Signed   By: Charlett Nose M.D.   On: 10/26/2015 11:20   Dg Abd 1 View  10/26/2015  CLINICAL DATA:  Abdominal pain.  Multiple surgeries EXAM: ABDOMEN - 1 VIEW COMPARISON:  None. FINDINGS: There are multiple surgical clips throughout the abdomen. There is a femoral catheter with the tip overlying L5, likely in the common femoral vein on the left. Bowel gas pattern is normal. No obstruction or free air. There are multiple vascular calcifications in the pelvis and lower abdominal aorta. IMPRESSION: Extensive atherosclerosis. Multiple surgical clips throughout the abdomen. Femoral catheter on the left in the expected location of the left common iliac artery. Bowel gas pattern unremarkable. No demonstrable bowel obstruction or free air. Electronically Signed   By: Bretta Bang III M.D.   On: 10/26/2015 11:19   Mr Foot Right Wo Contrast  10/27/2015  CLINICAL DATA:  Patient had a nail  taken off in the emergency room about 3 months ago. Over the past 3 or 4 months she's been having significant pains in the right toe and not able to walk, she admits of weakness, chills. MRI OF THE RIGHT FOREFOOT WITHOUT CONTRAST TECHNIQUE: Multiplanar, multisequence MR imaging was performed. No intravenous contrast was administered. COMPARISON:  None. FINDINGS: Bones/Joint/Cartilage Marrow edema within the first distal phalanx with corresponding T1 hypointense signal and irregularity along the distal dorsal tip of the first  distal phalanx just under the nail bed concerning for osteomyelitis of the first distal phalanx. No other marrow signal abnormality. No acute fracture or dislocation. Normal alignment. No joint effusion. Ligaments Collateral ligaments are intact. Tendons Flexor, peroneal and extensor compartment tendons are intact. Muscles Normal. Soft tissue No fluid collection or hematoma.  No soft tissue mass. IMPRESSION: Findings concerning for osteomyelitis of the first distal phalanx of the right foot. No drainable fluid collection to suggest an abscess. Electronically Signed   By: Elige KoHetal  Patel   On: 10/27/2015 16:27     Medications:     . aspirin EC  81 mg Oral Daily  . atorvastatin  20 mg Oral q1800  . calcium gluconate 1 GM IV  1 g Intravenous Once  . cinacalcet  90 mg Oral Q supper  . docusate sodium  100 mg Oral BID  . heparin subcutaneous  5,000 Units Subcutaneous Q8H  . metoprolol tartrate  12.5 mg Oral BID  . mupirocin ointment   Topical BID  . mupirocin ointment   Nasal Daily  . piperacillin-tazobactam (ZOSYN)  IV  3.375 g Intravenous Q12H  . senna  1 tablet Oral BID  . sevelamer carbonate  1,600 mg Oral TID WC  . sodium chloride flush  3 mL Intravenous Q12H  . sodium chloride flush  3 mL Intravenous Q12H  . venlafaxine XR  37.5 mg Oral Q breakfast     Assessment/ Plan:  [name] is a 70 y.o. black female with hypertension, diabetes mellitus type II,  hyperlipidemia, thyroid disorder and ESRD on hemodialysis. Pt recently moved from [place] via [place]. Now staying with her sister.   [facility] TTS [address]  RIJ permcath  1. End Stage Renal Disease with hyperkalemia on admission: Continue TTS schedule.   2. Hypertension: at goal. Home regimen of amlodipine and lisinopril - currently on metoprolol.  3. Secondary Hyperparathyroidism: outpatient PTH >1360. Phos 5.7  - continue sevelamer for phos binding - cinacalcet  4. Anemia of chronic kidney disease:  - no acute indication for epo especially with concern for ischemic leg.   5. Peripheral Vascular disease: with pain of his right toe.  - Angiogram not very successful at revascularization.  Appreciate vascular and podiatry input.    LOS: 6 Denese Mentink 7/16/201710:12 AM

## 2015-10-29 LAB — GLUCOSE, CAPILLARY
GLUCOSE-CAPILLARY: 101 mg/dL — AB (ref 65–99)
GLUCOSE-CAPILLARY: 119 mg/dL — AB (ref 65–99)
Glucose-Capillary: 86 mg/dL (ref 65–99)
Glucose-Capillary: 91 mg/dL (ref 65–99)

## 2015-10-29 MED ORDER — AMPICILLIN-SULBACTAM SODIUM 3 (2-1) G IJ SOLR
3.0000 g | Freq: Two times a day (BID) | INTRAMUSCULAR | Status: DC
  Filled 2015-10-29: qty 3

## 2015-10-29 MED ORDER — LEVOFLOXACIN 500 MG PO TABS
500.0000 mg | ORAL_TABLET | ORAL | Status: DC
  Administered 2015-10-31 – 2015-11-04 (×3): 500 mg via ORAL
  Filled 2015-10-29 (×4): qty 1

## 2015-10-29 MED ORDER — LEVOFLOXACIN 750 MG PO TABS
750.0000 mg | ORAL_TABLET | Freq: Once | ORAL | Status: AC
  Administered 2015-10-29: 750 mg via ORAL
  Filled 2015-10-29: qty 1

## 2015-10-29 MED ORDER — LISINOPRIL 10 MG PO TABS
10.0000 mg | ORAL_TABLET | Freq: Every day | ORAL | Status: DC
  Administered 2015-10-29 – 2015-10-30 (×2): 10 mg via ORAL
  Filled 2015-10-29 (×2): qty 1

## 2015-10-29 NOTE — Progress Notes (Addendum)
Patient ID: Tracy Drake, female   DOB: 09-Jan-1946, 70 y.o.   MRN: [id number] [facility] PROGRESS NOTE  Benedicta Sultan GNF:[id number] DOB: 25-Dec-1945 DOA: 10/22/2015 PCP: Pcp Not In System  HPI/Subjective: Patient complains pain in the right great toe. No other complaint.  Objective: Filed Vitals:   10/29/15 0945 10/29/15 1450  BP: 138/62 169/53  Pulse: 62 62  Temp: 98.7 F (37.1 C) 98.2 F (36.8 C)  Resp: 16 18    Filed Weights   10/27/15 1300 10/28/15 0438 10/29/15 0334  Weight: 139 lb 1.8 oz (63.1 kg) 137 lb 3.2 oz (62.234 kg) 138 lb 14.4 oz (63.005 kg)    ROS: Review of Systems  Constitutional: Negative for fever, chills and weight loss.  HENT: Negative for congestion.   Eyes: Negative for blurred vision and double vision.  Respiratory: Negative for cough, sputum production, shortness of breath and wheezing.   Cardiovascular: Negative for chest pain, palpitations, orthopnea, leg swelling and PND.  Gastrointestinal: Negative for nausea, vomiting, abdominal pain, diarrhea, constipation and blood in stool.  Genitourinary: Negative for dysuria, urgency, frequency and hematuria.  Musculoskeletal: Negative for falls.  right great toe pain. Neurological: Negative for dizziness, tremors, focal weakness and headaches.  Endo/Heme/Allergies: Does not bruise/bleed easily.  Psychiatric/Behavioral: Negative for depression. The patient does not have insomnia.    Exam: Physical Exam  HENT:  Nose: No mucosal edema.  Mouth/Throat: No oropharyngeal exudate or posterior oropharyngeal edema.  Eyes: Conjunctivae, EOM and lids are normal. Pupils are equal, round, and reactive to light.  Neck: No JVD present. Carotid bruit is not present. No edema present. No thyroid mass and no thyromegaly present.  Cardiovascular: S1 normal and S2 normal.  Exam reveals no gallop.   Murmur heard.  Systolic murmur is present with a grade of 2/6  Pulses:      Dorsalis pedis pulses are not present.   Respiratory: No respiratory distress. She has no wheezes. She has no rhonchi. She has no rales.  GI: Soft. Bowel sounds are normal. There is no tenderness.  Musculoskeletal:       Right ankle: She exhibits no swelling.       Left ankle: She exhibits no swelling.  Lymphadenopathy:    She has no cervical adenopathy.  Neurological: She is alert.  Skin: Skin is warm. no clubbing.  right great toe with dressing. Psychiatric: She has a normal mood and affect.  Patient is awaken and alert, answers questions appropriately    Data Reviewed: Basic Metabolic Panel:  Recent Labs Lab 10/23/15 0655 10/23/15 1118 10/25/15 1321 10/27/15 0951  NA 139 139 136 137  K 5.1 4.7 4.1 4.1  CL 106 103 95* 98*  CO2 20* GLUCOSE 66 87 136* 115*  BUN 69* 73* 42* 29*  CREATININE 9.01* 9.51* 7.00* 6.12*  CALCIUM 8.8* 9.0 8.9 8.6*  PHOS 4.8*  --  6.9* 5.7*   Liver Function Tests:  Recent Labs Lab 10/23/15 0655 10/25/15 1321 10/27/15 0951  ALBUMIN 3.1* 2.7* 2.6*   CBC:  Recent Labs Lab 10/23/15 1118 10/24/15 0445 10/25/15 0454 10/25/15 1321 10/27/15 0951  WBC 4.3 4.5  --  4.6 4.5  HGB 11.6* 11.3*  --  10.3* 9.5*  HCT 34.0* 33.1*  --  30.8* 27.3*  MCV 97.6 97.0  --  98.2 97.4  PLT 167 128* 130* 127* 138*   Cardiac Enzymes:  Recent Labs Lab 10/22/15 2145 10/23/15 0655 10/23/15 1118  TROPONINI <0.03 1.27* <0.03  CBG:  Recent Labs Lab 10/28/15 1102 10/28/15 1628 10/28/15 2113 10/29/15 0724 10/29/15 1141  GLUCAP 83 79 100* 86 101*    Recent Results (from the past 240 hour(s))  Blood culture (routine x 2)     Status: None   Collection Time: 10/22/15  2:15 PM  Result Value Ref Range Status   Specimen Description BLOOD RIGHT ARM  Final   Special Requests BOTTLES DRAWN AEROBIC AND ANAEROBIC 6CC  Final   Culture NO GROWTH 5 DAYS  Final   Report Status 10/27/2015 FINAL  Final  Blood culture (routine x 2)     Status: None   Collection Time: 10/22/15  2:25 PM   Result Value Ref Range Status   Specimen Description BLOOD RIGHT ARM  Final   Special Requests BOTTLES DRAWN AEROBIC AND ANAEROBIC 6CC  Final   Culture NO GROWTH 5 DAYS  Final   Report Status 10/27/2015 FINAL  Final  MRSA PCR Screening     Status: None   Collection Time: 10/22/15 10:09 PM  Result Value Ref Range Status   MRSA by PCR NEGATIVE NEGATIVE Final    Comment:        The GeneXpert MRSA Assay (FDA approved for NASAL specimens only), is one component of a comprehensive MRSA colonization surveillance program. It is not intended to diagnose MRSA infection nor to guide or monitor treatment for MRSA infections.   Aerobic/Anaerobic Culture (surgical/deep wound)     Status: None (Preliminary result)   Collection Time: 10/26/15  3:42 PM  Result Value Ref Range Status   Specimen Description TOE RIGHT  Final   Special Requests NONE  Final   Gram Stain   Final    RARE WBC PRESENT, PREDOMINANTLY MONONUCLEAR ABUNDANT GRAM POSITIVE COCCI IN PAIRS MODERATE GRAM VARIABLE ROD Performed at [facility]    Culture   Final    FEW STENOTROPHOMONAS MALTOPHILIA NO ANAEROBES ISOLATED; CULTURE IN PROGRESS FOR 5 DAYS    Report Status PENDING  Incomplete   Organism ID, Bacteria STENOTROPHOMONAS MALTOPHILIA  Final      Susceptibility   Stenotrophomonas maltophilia - MIC*    LEVOFLOXACIN 0.25 SENSITIVE Sensitive     TRIMETH/SULFA <=20 SENSITIVE Sensitive     * FEW STENOTROPHOMONAS MALTOPHILIA     Studies: Mr Foot Right Wo Contrast  10/27/2015  CLINICAL DATA:  Patient had a nail taken off in the emergency room about 3 months ago. Over the past 3 or 4 months she's been having significant pains in the right toe and not able to walk, she admits of weakness, chills. MRI OF THE RIGHT FOREFOOT WITHOUT CONTRAST TECHNIQUE: Multiplanar, multisequence MR imaging was performed. No intravenous contrast was administered. COMPARISON:  None. FINDINGS: Bones/Joint/Cartilage Marrow edema within the  first distal phalanx with corresponding T1 hypointense signal and irregularity along the distal dorsal tip of the first distal phalanx just under the nail bed concerning for osteomyelitis of the first distal phalanx. No other marrow signal abnormality. No acute fracture or dislocation. Normal alignment. No joint effusion. Ligaments Collateral ligaments are intact. Tendons Flexor, peroneal and extensor compartment tendons are intact. Muscles Normal. Soft tissue No fluid collection or hematoma.  No soft tissue mass. IMPRESSION: Findings concerning for osteomyelitis of the first distal phalanx of the right foot. No drainable fluid collection to suggest an abscess. Electronically Signed   By: Elige Ko   On: 10/27/2015 16:27    Scheduled Meds: . ampicillin-sulbactam (UNASYN) IV  3 g Intravenous Q12H  . aspirin  EC  81 mg Oral Daily  . atorvastatin  20 mg Oral q1800  . calcium gluconate 1 GM IV  1 g Intravenous Once  . cinacalcet  90 mg Oral Q supper  . docusate sodium  100 mg Oral BID  . heparin subcutaneous  5,000 Units Subcutaneous Q8H  . metoprolol tartrate  12.5 mg Oral BID  . mupirocin ointment   Topical BID  . mupirocin ointment   Nasal Daily  . senna  1 tablet Oral BID  . sevelamer carbonate  1,600 mg Oral TID WC  . sodium chloride flush  3 mL Intravenous Q12H  . sodium chloride flush  3 mL Intravenous Q12H  . venlafaxine XR  37.5 mg Oral Q breakfast    Assessment/Plan:  1. Right first toe pain due to Suspected osteomyelitis or ischemia .  Continue pain medications. She was on heparin subcutaneously.  X-ray of the toe could not rule out an osteomyelitis. MRI report possible osteomyelitis.  she has been treated with  Zosyn, off vancomycin. Dr. Orland Jarredroxler suggests Levaquin orally based on the culture and also with the good bone penetrate ability to the Levaquin.  Continue with wet-to-dry saline dressings to the toe.  PVD. The patient underwent angiography July 12, sluggish flow was noted,  due to severe peripheral vascular disease, not an amenable to intervention due to patient moving intermittently on procedure table, unable to get great toe amputated due to severe peripheral vascular disease.  Per Dr. Evie LacksEsco, vascular surgeon, pt has limited options for revascularization at this point. If patient has uncontrolled pain and clear evidence of osteomyelitis consideration of reattempt under general anesthesia with endovascular therapy although likelihood of success is poor otherwise would recommend AKA. F/u Dr. Wyn Quakerew.  2. Hyperkalemia on presentation secondary to missed hemodialysis sessions. The patient underwent hemodialysis ,  improved.  3. Elevated troponin.  Patient not having chest pain or shortness of breath. Patient already on aspirin and metoprolol and statin. Cardiology consultation is appreciated, further evaluation of his pharmacologic nuclear stress test was recommended, possibly outpatient, echocardiogram revealed normal ejection fraction, LVH, grade 1 diastolic dysfunction.  4. End-stage renal disease. continue hemodialysis as per nephrology. 5. Anemia of chronic disease, stable 6. Hyperglycemia, BS is normal. Hemoglobin A1c is low patient is not a diabetic. 7     Essential hypertension, BP is elevated, resume lisinopril. continue lopressor.  F/u  Palliative care consult. I  Discussed with Dr. Evie LacksEsco  Code Status:     Code Status Orders        Start     Ordered   10/22/15 2049  Full code   Continuous     10/22/15 2048    Code Status History    Date Active Date Inactive Code Status Order ID Comments User Context   This patient has a current code status but no historical code status.     Family Communication: I discussed with her son. Disposition Plan: Plan is for patient to D/C to [facility] when stable. Clinical Social Worker (CSW) will continue to follow and assist as needed.   Consultants:  Cardiology  Nephrology  Podiatry  Vascular  surgery  Antibiotics:  Zosyn  Time spent: 28 minutes  Shaune Pollackhen, Calistro Rauf  [employer]

## 2015-10-29 NOTE — Progress Notes (Signed)
Subjective: Patient with peripheral vascular disease to the right foot unlikely candidate for much improvement. Dr. dew is following her for her peripheral vascular disease. He may be out of try something under general anesthesia later this week.  Objective: Toe still has purulent drainage from the right fibular margin of the nail there is a positive indication of osteomyelitis based on the MRI to that region. She has no palpable pulses to the foot and has discoloration to the right foot consistent with ischemia. Since it was not amenable to improvement earlier last week I felt that it is questionable if any procedure would heal at this timeframe.  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  Plan: Right now would continue antibiotics likely suggest Levaquin orally based on the culture and also with the good bone penetrate ability to the Levaquin. Suggest a infectious disease consult to confirm. Continue with wet-to-dry saline dressings to the toe and I will follow as necessary. Successful healing is dependent on whether or not she can be revascularized. Otherwise she would likely be a candidate for BKA or AKA.

## 2015-10-29 NOTE — Progress Notes (Signed)
Pharmacy Antibiotic Note  Tracy CarrowLouvenia Drake is a 70 y.o. female admitted on 10/22/2015 with wound infection.  Pharmacy has been consulted for levofloxacin dosing.  Per podiatry recommendation, MD wishes to start Levofloxacin.  Plan: The dose of levofloxacin will be adjusted to 750 mg po once then 500 mg po q48h based on renal function.  Height: 5\' 2"  (157.5 cm) Weight: 138 lb 14.4 oz (63.005 kg) IBW/kg (Calculated) : 50.1  Temp (24hrs), Avg:98.4 F (36.9 C), Min:97.9 F (36.6 C), Max:98.7 F (37.1 C)   Recent Labs Lab 10/23/15 0655 10/23/15 1118 10/24/15 0445 10/25/15 0454 10/25/15 1321 10/27/15 0951  WBC  --  4.3 4.5  --  4.6 4.5  CREATININE 9.01* 9.51*  --   --  7.00* 6.12*  VANCORANDOM  --   --   --  16  --   --     Estimated Creatinine Clearance: 7.4 mL/min (by C-G formula based on Cr of 6.12).    No Known Allergies  Antimicrobials this admission: Vancomycin 7/10 >> 7/14 Zosyn 7/10 >> 7/17 Levofloxacin 7/17>>   Microbiology results: 7/10 BCx: NG 7/14 WCx: few stenotrophomonas maltophilia 7/10 MRSA PCR: negatvie  Thank you for allowing pharmacy to be a part of this patient's care.  Pammie Chirino G 10/29/2015 4:18 PM

## 2015-10-29 NOTE — Care Management Important Message (Signed)
Important Message  Patient Details  Name: Tracy Drake MRN: [id number] Date of Birth: 07/23/1945   Medicare Important Message Given:  Yes    Adonis HugueninBerkhead, Makyi Ledo L, RN 10/29/2015, 1:28 PM

## 2015-10-29 NOTE — Progress Notes (Signed)
Subjective:  Patient reports pain in the right toe No SOB    Objective:  Vital signs in last 24 hours:  Temp:  [97.9 F (36.6 C)-98.7 F (37.1 C)] 98.7 F (37.1 C) (07/17 0945) Pulse Rate:  [59-62] 62 (07/17 0945) Resp:  [16-18] 16 (07/17 0945) BP: (138-153)/(50-72) 138/62 mmHg (07/17 0945) SpO2:  [94 %-100 %] 94 % (07/17 0945) Weight:  [63.005 kg (138 lb 14.4 oz)] 63.005 kg (138 lb 14.4 oz) (07/17 0334)  Weight change: -0.495 kg (-1 lb 1.5 oz) Filed Weights   10/27/15 1300 10/28/15 0438 10/29/15 0334  Weight: 63.1 kg (139 lb 1.8 oz) 62.234 kg (137 lb 3.2 oz) 63.005 kg (138 lb 14.4 oz)    Intake/Output:    Intake/Output Summary (Last 24 hours) at 10/29/15 1338 Last data filed at 10/29/15 0947  Gross per 24 hour  Intake    123 ml  Output      0 ml  Net    123 ml     Physical Exam: General: NAD, laying in bed  HEENT anicteric  Neck supple  Pulm/lungs Clear, normal effort  CVS/Heart No rub or gallop  Abdomen:  Soft, NT  Extremities: No edema, rt toe bandage  Neurologic: Alert, able to answer questions  Skin: No acute issues  Access: Rt IJ PC, left arm AVF       Basic Metabolic Panel:   Recent Labs Lab 10/23/15 0655 10/23/15 1118 10/25/15 1321 10/27/15 0951  NA 139 139 136 137  K 5.1 4.7 4.1 4.1  CL 106 103 95* 98*  CO2 20* GLUCOSE 66 87 136* 115*  BUN 69* 73* 42* 29*  CREATININE 9.01* 9.51* 7.00* 6.12*  CALCIUM 8.8* 9.0 8.9 8.6*  PHOS 4.8*  --  6.9* 5.7*     CBC:  Recent Labs Lab 10/23/15 1118 10/24/15 0445 10/25/15 0454 10/25/15 1321 10/27/15 0951  WBC 4.3 4.5  --  4.6 4.5  HGB 11.6* 11.3*  --  10.3* 9.5*  HCT 34.0* 33.1*  --  30.8* 27.3*  MCV 97.6 97.0  --  98.2 97.4  PLT 167 128* 130* 127* 138*      Microbiology:  Recent Results (from the past 720 hour(s))  Blood culture (routine x 2)     Status: None   Collection Time: 10/22/15  2:15 PM  Result Value Ref Range Status   Specimen Description BLOOD RIGHT ARM   Final   Special Requests BOTTLES DRAWN AEROBIC AND ANAEROBIC 6CC  Final   Culture NO GROWTH 5 DAYS  Final   Report Status 10/27/2015 FINAL  Final  Blood culture (routine x 2)     Status: None   Collection Time: 10/22/15  2:25 PM  Result Value Ref Range Status   Specimen Description BLOOD RIGHT ARM  Final   Special Requests BOTTLES DRAWN AEROBIC AND ANAEROBIC 6CC  Final   Culture NO GROWTH 5 DAYS  Final   Report Status 10/27/2015 FINAL  Final  MRSA PCR Screening     Status: None   Collection Time: 10/22/15 10:09 PM  Result Value Ref Range Status   MRSA by PCR NEGATIVE NEGATIVE Final    Comment:        The GeneXpert MRSA Assay (FDA approved for NASAL specimens only), is one component of a comprehensive MRSA colonization surveillance program. It is not intended to diagnose MRSA infection nor to guide or monitor treatment for MRSA infections.   Aerobic/Anaerobic Culture (surgical/deep wound)  Status: None (Preliminary result)   Collection Time: 10/26/15  3:42 PM  Result Value Ref Range Status   Specimen Description TOE RIGHT  Final   Special Requests NONE  Final   Gram Stain   Final    RARE WBC PRESENT, PREDOMINANTLY MONONUCLEAR ABUNDANT GRAM POSITIVE COCCI IN PAIRS MODERATE GRAM VARIABLE ROD [facility]    Culture   Final    FEW STENOTROPHOMONAS MALTOPHILIA NO ANAEROBES ISOLATED; CULTURE IN PROGRESS FOR 5 DAYS    Report Status PENDING  Incomplete   Organism ID, Bacteria STENOTROPHOMONAS MALTOPHILIA  Final      Susceptibility   Stenotrophomonas maltophilia - MIC*    LEVOFLOXACIN 0.25 SENSITIVE Sensitive     TRIMETH/SULFA <=20 SENSITIVE Sensitive     * FEW STENOTROPHOMONAS MALTOPHILIA    Coagulation Studies: No results for input(s): LABPROT, INR in the last 72 hours.  Urinalysis: No results for input(s): COLORURINE, LABSPEC, PHURINE, GLUCOSEU, HGBUR, BILIRUBINUR, KETONESUR, PROTEINUR, UROBILINOGEN, NITRITE, LEUKOCYTESUR in the last 72  hours.  Invalid input(s): APPERANCEUR    Imaging: Mr Foot Right Wo Contrast  10/27/2015  CLINICAL DATA:  Patient had a nail taken off in the emergency room about 3 months ago. Over the past 3 or 4 months she's been having significant pains in the right toe and not able to walk, she admits of weakness, chills. MRI OF THE RIGHT FOREFOOT WITHOUT CONTRAST TECHNIQUE: Multiplanar, multisequence MR imaging was performed. No intravenous contrast was administered. COMPARISON:  None. FINDINGS: Bones/Joint/Cartilage Marrow edema within the first distal phalanx with corresponding T1 hypointense signal and irregularity along the distal dorsal tip of the first distal phalanx just under the nail bed concerning for osteomyelitis of the first distal phalanx. No other marrow signal abnormality. No acute fracture or dislocation. Normal alignment. No joint effusion. Ligaments Collateral ligaments are intact. Tendons Flexor, peroneal and extensor compartment tendons are intact. Muscles Normal. Soft tissue No fluid collection or hematoma.  No soft tissue mass. IMPRESSION: Findings concerning for osteomyelitis of the first distal phalanx of the right foot. No drainable fluid collection to suggest an abscess. Electronically Signed   By: Elige KoHetal  Patel   On: 10/27/2015 16:27     Medications:     . aspirin EC  81 mg Oral Daily  . atorvastatin  20 mg Oral q1800  . calcium gluconate 1 GM IV  1 g Intravenous Once  . cinacalcet  90 mg Oral Q supper  . docusate sodium  100 mg Oral BID  . heparin subcutaneous  5,000 Units Subcutaneous Q8H  . metoprolol tartrate  12.5 mg Oral BID  . mupirocin ointment   Topical BID  . mupirocin ointment   Nasal Daily  . piperacillin-tazobactam (ZOSYN)  IV  3.375 g Intravenous Q12H  . senna  1 tablet Oral BID  . sevelamer carbonate  1,600 mg Oral TID WC  . sodium chloride flush  3 mL Intravenous Q12H  . sodium chloride flush  3 mL Intravenous Q12H  . venlafaxine XR  37.5 mg Oral Q breakfast    sodium chloride, acetaminophen **OR** acetaminophen, HYDROcodone-acetaminophen, morphine injection, ondansetron **OR** ondansetron (ZOFRAN) IV, polyethylene glycol, sodium chloride flush  Assessment/ Plan:  70 y.o.African American female with hypertension, diabetes mellitus type II, hyperlipidemia, thyroid disorder and ESRD on hemodialysis. Pt recently moved from [place] via [place]. Now staying with her sister.   [facility] TTS [address] RIJ permcath  1. End Stage Renal Disease with hyperkalemia on admission: Continue TTS schedule.  -  dialysis tomorrow - evaluate AVF if it can be used  2.  Anemia of chronic kidney disease:  -  Low dose EPO with dialysis  3. Secondary Hyperparathyroidism: outpatient PTH >1360. Phos 5.7  - continue sevelamer for phos binding - cinacalcet  4. Peripheral Vascular disease: with pain in right toe.  - Angiogram not very successful at revascularization. Appreciate vascular and podiatry input.    LOS: 7 Raphel Stickles 7/17/20171:38 PM

## 2015-10-29 NOTE — Progress Notes (Signed)
 Vein and Vascular Surgery  Daily Progress Note   Subjective  - 5 Days Post-Op  Patient having lots of pain.  Terrible historian  Objective Filed Vitals:   10/29/15 0334 10/29/15 0945 10/29/15 1450 10/29/15 1738  BP:  138/62 169/53 162/58  Pulse:  62 62   Temp:  98.7 F (37.1 C) 98.2 F (36.8 C)   TempSrc:  Oral Oral   Resp:  16 18   Height:      Weight: 63.005 kg (138 lb 14.4 oz)     SpO2:  94% 98%     Intake/Output Summary (Last 24 hours) at 10/29/15 1803 Last data filed at 10/29/15 0947  Gross per 24 hour  Intake    123 ml  Output      0 ml  Net    123 ml    PULM  CTAB CV  RRR VASC  Non-palpable pedal pulses, right great toe wound dressed  Laboratory CBC    Component Value Date/Time   WBC 4.5 10/27/2015 0951   HGB 9.5* 10/27/2015 0951   HCT 27.3* 10/27/2015 0951   PLT 138* 10/27/2015 0951    BMET    Component Value Date/Time   NA 137 10/27/2015 0951   K 4.1 10/27/2015 0951   CL 98* 10/27/2015 0951   CO2 31 10/27/2015 0951   GLUCOSE 115* 10/27/2015 0951   BUN 29* 10/27/2015 0951   CREATININE 6.12* 10/27/2015 0951   CALCIUM 8.6* 10/27/2015 0951   GFRNONAA 6* 10/27/2015 0951   GFRAA 7* 10/27/2015 0951    Assessment/Planning: POD #5 s/p RLE angiogram   Discussed with Dr. Orland Jarredroxler.  With osteomyelitis, needs resection but unlikely to heal without perfusion.  Lots of pain as well  Can try another angiogram but will need anesthesia as she never stopped moving last time and attempts were unsuccessful for revascularization  Will schedule for Wednesday if OR allows    DEW,JASON  10/29/2015, 6:03 PM

## 2015-10-30 DIAGNOSIS — M869 Osteomyelitis, unspecified: Secondary | ICD-10-CM

## 2015-10-30 DIAGNOSIS — N186 End stage renal disease: Secondary | ICD-10-CM

## 2015-10-30 DIAGNOSIS — Z7189 Other specified counseling: Secondary | ICD-10-CM

## 2015-10-30 DIAGNOSIS — Z515 Encounter for palliative care: Secondary | ICD-10-CM

## 2015-10-30 LAB — GLUCOSE, CAPILLARY
GLUCOSE-CAPILLARY: 127 mg/dL — AB (ref 65–99)
Glucose-Capillary: 60 mg/dL — ABNORMAL LOW (ref 65–99)
Glucose-Capillary: 88 mg/dL (ref 65–99)

## 2015-10-30 MED ORDER — METOPROLOL TARTRATE 25 MG PO TABS
12.5000 mg | ORAL_TABLET | Freq: Two times a day (BID) | ORAL | Status: DC
  Administered 2015-10-30 – 2015-11-07 (×11): 12.5 mg via ORAL
  Filled 2015-10-30 (×13): qty 1

## 2015-10-30 MED ORDER — LISINOPRIL 10 MG PO TABS
10.0000 mg | ORAL_TABLET | Freq: Every day | ORAL | Status: DC
  Administered 2015-10-31 – 2015-11-05 (×2): 10 mg via ORAL
  Filled 2015-10-30 (×3): qty 1

## 2015-10-30 NOTE — Progress Notes (Signed)
tx completed 

## 2015-10-30 NOTE — Progress Notes (Signed)
Pre-hd tx 

## 2015-10-30 NOTE — Consult Note (Signed)
Consultation Note Date: 10/30/2015   Patient Name: Tracy Drake  DOB: 09/10/1945  MRN: [id number]  Age / Sex: 70 y.o., female  PCP: Pcp Not In System Referring Physician: Shaune Pollack, MD  Reason for Consultation: Establishing goals of care and Psychosocial/spiritual support  HPI/Patient Profile: 70 y.o. female  admitted on 10/22/2015 witha known history of ESRD/dialysis dependnnet,  stroke, hypertension, hyperlipidemia, diabetes mellitus, not on diabetic medications recently, who presents to the hospital with complaints of right great toe pain. Apparently patient was brought from [place] about 3-1/2 months, from her daughter's care to her sisters care.  According to her sister she had a nail taken off in the emergency room about 3 months ago, she was given antibiotics.  Over the past 3 or 4 months she's been having significant pains in the right toe and not able to walk, she admits of weakness, chills .   The patient has missed 2 days of dialysis due to patient's sisters emergency, she was brought to emergency room for further evaluation and treatment. In emergency room, she was noted to be hyperkalemic with potassium level of 7.1. Her great toe looked dusky, had foul smell,  x-ray revealed possible osteomyelitis.     Complicated psych-social situation.  Social services has been notified.  Patient and her family faced advanced directive decisions, treatment option decsions and anticipatory care needs.  Clinical Assessment and Goals of Care:  This NP Lorinda Creed reviewed medical records, received report from team, assessed the patient and then meet at the patient's bedside along with her sister Robert Bellow  to discuss diagnosis, prognosis, GOC, EOL wishes disposition and options.   A detailed discussion was had today regarding advanced directives.  Concepts specific to code status, artifical feeding  and hydration, continued IV antibiotics and rehospitalization was had.  The difference between a aggressive medical intervention path  and a palliative comfort care path for this patient at this time was had.  Values and goals of care important to patient and family were attempted to be elicited.  Concept of Hospice and Palliative Care were discussed   Discussed in detail the limitations of medical interventions ( when surgery is not an option and even when dialysis becomes impossible to continue), we discussed the concept of mortality.   Natural trajectory and expectations at EOL were discussed.     There is no documented HPOA at this time.  Today Ms Cottrell is alert, oriented and with clear insight into her medical and social situation.   She understands her complicated and "serious" medical issues and is open to all offered and available medical interventions to prolong life.  "If they have to take my foot, do what ever is necessary".  She wants to continue dialysis and long as possible.   However she is very clear about about her DNR and status and never " a feeding tube"   Discussed in detail the limitations of medical interventions ( when surgery is not an option and even when dialysis becomes impossible to continue), we  discussed the concept of mortality.   She also is clear about the level of care she needs and is open to SNF on discharge.  Her sister [name] supports decsions  Questions and concerns addressed. Family encouraged to call with questions or concerns.  PMT will continue to support holistically.    SUMMARY OF RECOMMENDATIONS    Code Status/Advance Care Planning:   DNR-documented today    Symptom Management:   Weakness/dibiliyt:  PT/OT as toelrated  Palliative Prophylaxis:   Bowel Regimen, Delirium Protocol, Frequent Pain Assessment and Oral Care  Psycho-social/Spiritual:   Desire for further Chaplaincy support:yes, will need assistance documenting  HPOA  Additional Recommendations: Information on Palliative service at SNF  Prognosis:   Unable to determine at this time, dependent on outcomes  Discharge Planning: SNF with Palliative services     Primary Diagnoses: Present on Admission:  . Osteomyelitis (HCC)  I have reviewed the medical record, interviewed the patient and family, and examined the patient. The following aspects are pertinent.  Past Medical History  Diagnosis Date  . Diabetes mellitus (HCC)     a. not on medications  . History of stroke   . ESRD (end stage renal disease) on dialysis (HCC)   . Essential hypertension   . HLD (hyperlipidemia)   . Diabetic neuropathy [facility](HCC)    Social History   Social History  . Marital Status: Single    Spouse Name: N/A  . Number of Children: N/A  . Years of Education: N/A   Social History Main Topics  . Smoking status: Never Smoker   . Smokeless tobacco: Never Used  . Alcohol Use: No  . Drug Use: None  . Sexual Activity: Not Asked   Other Topics Concern  . None   Social History Narrative   Family History  Problem Relation Age of Onset  . Hypertension Mother    Scheduled Meds: . aspirin EC  81 mg Oral Daily  . atorvastatin  20 mg Oral q1800  . calcium gluconate 1 GM IV  1 g Intravenous Once  . cinacalcet  90 mg Oral Q supper  . docusate sodium  100 mg Oral BID  . heparin subcutaneous  5,000 Units Subcutaneous Q8H  . [START ON 10/31/2015] levofloxacin  500 mg Oral Q48H  . [START ON 10/31/2015] lisinopril  10 mg Oral Daily  . metoprolol tartrate  12.5 mg Oral BID  . mupirocin ointment   Topical BID  . mupirocin ointment   Nasal Daily  . senna  1 tablet Oral BID  . sevelamer carbonate  1,600 mg Oral TID WC  . sodium chloride flush  3 mL Intravenous Q12H  . sodium chloride flush  3 mL Intravenous Q12H  . venlafaxine XR  37.5 mg Oral Q breakfast   Continuous Infusions:  PRN Meds:.sodium chloride, acetaminophen **OR** acetaminophen,  HYDROcodone-acetaminophen, morphine injection, ondansetron **OR** ondansetron (ZOFRAN) IV, polyethylene glycol, sodium chloride flush Medications Prior to Admission:  Prior to Admission medications   Medication Sig Start Date End Date Taking? Authorizing Provider  amLODipine (NORVASC) 2.5 MG tablet Take 2.5 mg by mouth daily.   Yes Historical Provider, MD  aspirin EC 81 MG tablet Take 81 mg by mouth daily.   Yes Historical Provider, MD  atorvastatin (LIPITOR) 20 MG tablet Take 20 mg by mouth daily at 6 PM.   Yes Historical Provider, MD  cinacalcet (SENSIPAR) 90 MG tablet Take 90 mg by mouth daily with supper.   Yes Historical Provider, MD  lisinopril (PRINIVIL,ZESTRIL) 10  MG tablet Take 10 mg by mouth daily.   Yes Historical Provider, MD  sevelamer carbonate (RENVELA) 800 MG tablet Take 1,600 mg by mouth 3 (three) times daily with meals.   Yes Historical Provider, MD  venlafaxine XR (EFFEXOR-XR) 37.5 MG 24 hr capsule Take 37.5 mg by mouth daily with breakfast.   Yes Historical Provider, MD   No Known Allergies Review of Systems  Constitutional:       -right groin "discomfort"  All other systems reviewed and are negative.   Physical Exam  Constitutional: She is cooperative. She appears ill.  Cardiovascular: Normal rate, regular rhythm and normal heart sounds.   Pulmonary/Chest: She has decreased breath sounds in the right lower field and the left lower field.  Neurological: She is alert.  Skin: Skin is warm and dry.    Vital Signs: BP 167/51 mmHg  Pulse 68  Temp(Src) 98.3 F (36.8 C) (Oral)  Resp 18  Ht 5\' 2"  (1.575 m)  Wt 63 kg (138 lb 14.2 oz)  BMI 25.40 kg/m2  SpO2 100% Pain Assessment: 0-10 POSS *See Group Information*: 1-Acceptable,Awake and alert Pain Score: 0-No pain   SpO2: SpO2: 100 % O2 Device:SpO2: 100 % O2 Flow Rate: .O2 Flow Rate (L/min): 2 L/min  IO: Intake/output summary:  Intake/Output Summary (Last 24 hours) at 10/30/15 1542 Last data filed at 10/30/15  1406  Gross per 24 hour  Intake    460 ml  Output      1 ml  Net    459 ml    LBM: Last BM Date: 10/30/15 Baseline Weight: Weight: 61.236 kg (135 lb) Most recent weight: Weight: 63 kg (138 lb 14.2 oz)      Palliative Assessment/Data: 40%    Flowsheet Rows        Most Recent Value   Intake Tab    Referral Department  Hospitalist   Unit at Time of Referral  Orthopedic Unit   Palliative Care Primary Diagnosis  Sepsis/Infectious Disease   Date Notified  10/26/15   Palliative Care Type  New Palliative care   Reason for referral  Clarify Goals of Care   Date of Admission  10/22/15   # of days IP prior to Palliative referral  4   Clinical Assessment    Psychosocial & Spiritual Assessment    Palliative Care Outcomes       Time In: 1600 Time Out: 1715 Time Total: 75 min Greater than 50%  of this time was spent counseling and coordinating care related to the above assessment and plan.  Signed by: Lorinda Creed, NP   Please contact Palliative Medicine Team phone at [phone] for questions and concerns.  For individual provider: See Loretha Stapler

## 2015-10-30 NOTE — Progress Notes (Signed)
Post hd tx 

## 2015-10-30 NOTE — Progress Notes (Signed)
Subjective:  Patient reports pain in the right toe No SOB Patient seen during dialysis Tolerating well    HEMODIALYSIS FLOWSHEET:  Blood Flow Rate (mL/min): 400 mL/min Arterial Pressure (mmHg): -130 mmHg Venous Pressure (mmHg): 110 mmHg Transmembrane Pressure (mmHg): 30 mmHg Ultrafiltration Rate (mL/min): 90 mL/min Dialysate Flow Rate (mL/min): 800 ml/min Conductivity: Machine : 13.9 Conductivity: Machine : 13.9 Dialysis Fluid Bolus: Normal Saline Bolus Amount (mL): 250 mL Dialysate Change: 2K Intra-Hemodialysis Comments: 308. Resting  Angiogram planned for Wed    Objective:  Vital signs in last 24 hours:  Temp:  [98.1 F (36.7 C)-98.4 F (36.9 C)] 98.1 F (36.7 C) (07/18 1020) Pulse Rate:  [60-69] 63 (07/18 1200) Resp:  [15-24] 16 (07/18 1200) BP: (91-169)/(47-68) 96/47 mmHg (07/18 1200) SpO2:  [90 %-100 %] 97 % (07/18 1200) Weight:  [63.005 kg (138 lb 14.4 oz)] 63.005 kg (138 lb 14.4 oz) (07/18 1020)  Weight change:  Filed Weights   10/28/15 0438 10/29/15 0334 10/30/15 1020  Weight: 62.234 kg (137 lb 3.2 oz) 63.005 kg (138 lb 14.4 oz) 63.005 kg (138 lb 14.4 oz)    Intake/Output:    Intake/Output Summary (Last 24 hours) at 10/30/15 1221 Last data filed at 10/30/15 0900  Gross per 24 hour  Intake    460 ml  Output      0 ml  Net    460 ml     Physical Exam: General: NAD, laying in bed  HEENT anicteric  Neck supple  Pulm/lungs Clear, normal effort  CVS/Heart No rub or gallop  Abdomen:  Soft, NT  Extremities: No edema, rt toe bandage  Neurologic: Alert, able to answer questions  Skin: No acute issues  Access: Rt IJ PC, left arm AV access       Basic Metabolic Panel:   Recent Labs Lab 10/25/15 1321 10/27/15 0951  NA 136 137  K 4.1 4.1  CL 95* 98*  CO2 29 31  GLUCOSE 136* 115*  BUN 42* 29*  CREATININE 7.00* 6.12*  CALCIUM 8.9 8.6*  PHOS 6.9* 5.7*     CBC:  Recent Labs Lab 10/24/15 0445 10/25/15 0454 10/25/15 1321  10/27/15 0951  WBC 4.5  --  4.6 4.5  HGB 11.3*  --  10.3* 9.5*  HCT 33.1*  --  30.8* 27.3*  MCV 97.0  --  98.2 97.4  PLT 128* 130* 127* 138*      Microbiology:  Recent Results (from the past 720 hour(s))  Blood culture (routine x 2)     Status: None   Collection Time: 10/22/15  2:15 PM  Result Value Ref Range Status   Specimen Description BLOOD RIGHT ARM  Final   Special Requests BOTTLES DRAWN AEROBIC AND ANAEROBIC 6CC  Final   Culture NO GROWTH 5 DAYS  Final   Report Status 10/27/2015 FINAL  Final  Blood culture (routine x 2)     Status: None   Collection Time: 10/22/15  2:25 PM  Result Value Ref Range Status   Specimen Description BLOOD RIGHT ARM  Final   Special Requests BOTTLES DRAWN AEROBIC AND ANAEROBIC 6CC  Final   Culture NO GROWTH 5 DAYS  Final   Report Status 10/27/2015 FINAL  Final  MRSA PCR Screening     Status: None   Collection Time: 10/22/15 10:09 PM  Result Value Ref Range Status   MRSA by PCR NEGATIVE NEGATIVE Final    Comment:        The GeneXpert MRSA Assay (FDA approved  for NASAL specimens only), is one component of a comprehensive MRSA colonization surveillance program. It is not intended to diagnose MRSA infection nor to guide or monitor treatment for MRSA infections.   Aerobic/Anaerobic Culture (surgical/deep wound)     Status: None (Preliminary result)   Collection Time: 10/26/15  3:42 PM  Result Value Ref Range Status   Specimen Description TOE RIGHT  Final   Special Requests NONE  Final   Gram Stain   Final    RARE WBC PRESENT, PREDOMINANTLY MONONUCLEAR ABUNDANT GRAM POSITIVE COCCI IN PAIRS MODERATE GRAM VARIABLE ROD Performed at [facility]    Culture   Final    FEW STENOTROPHOMONAS MALTOPHILIA NO ANAEROBES ISOLATED; CULTURE IN PROGRESS FOR 5 DAYS    Report Status PENDING  Incomplete   Organism ID, Bacteria STENOTROPHOMONAS MALTOPHILIA  Final      Susceptibility   Stenotrophomonas maltophilia - MIC*    LEVOFLOXACIN 0.25  SENSITIVE Sensitive     TRIMETH/SULFA <=20 SENSITIVE Sensitive     * FEW STENOTROPHOMONAS MALTOPHILIA    Coagulation Studies: No results for input(s): LABPROT, INR in the last 72 hours.  Urinalysis: No results for input(s): COLORURINE, LABSPEC, PHURINE, GLUCOSEU, HGBUR, BILIRUBINUR, KETONESUR, PROTEINUR, UROBILINOGEN, NITRITE, LEUKOCYTESUR in the last 72 hours.  Invalid input(s): APPERANCEUR    Imaging: No results found.   Medications:     . aspirin EC  81 mg Oral Daily  . atorvastatin  20 mg Oral q1800  . calcium gluconate 1 GM IV  1 g Intravenous Once  . cinacalcet  90 mg Oral Q supper  . docusate sodium  100 mg Oral BID  . heparin subcutaneous  5,000 Units Subcutaneous Q8H  . [START ON 10/31/2015] levofloxacin  500 mg Oral Q48H  . lisinopril  10 mg Oral Daily  . metoprolol tartrate  12.5 mg Oral BID  . mupirocin ointment   Topical BID  . mupirocin ointment   Nasal Daily  . senna  1 tablet Oral BID  . sevelamer carbonate  1,600 mg Oral TID WC  . sodium chloride flush  3 mL Intravenous Q12H  . sodium chloride flush  3 mL Intravenous Q12H  . venlafaxine XR  37.5 mg Oral Q breakfast   sodium chloride, acetaminophen **OR** acetaminophen, HYDROcodone-acetaminophen, morphine injection, ondansetron **OR** ondansetron (ZOFRAN) IV, polyethylene glycol, sodium chloride flush  Assessment/ Plan:  70 y.o.African American female with hypertension, diabetes mellitus type II, hyperlipidemia, thyroid disorder and ESRD on hemodialysis. [place] recently moved from [place] Now staying with her sister.   [facility] [address] [facility]  1. End Stage Renal Disease with hyperkalemia on admission: Continue TTS schedule.  - Patient seen during dialysis Tolerating well   2.  Anemia of chronic kidney disease:  -  Low dose EPO with dialysis  3. Secondary Hyperparathyroidism: outpatient PTH >1360. Phos 5.7  - continue sevelamer for phos binding -  cinacalcet  4. Peripheral Vascular disease: with pain in right toe.  - repeat angiogram tomorrow.    LOS: 8 Shamia Uppal 7/18/201712:21 PM

## 2015-10-30 NOTE — Progress Notes (Addendum)
Patient ID: Tracy Drake, female   DOB: 05/05/1945, 70 y.o.   MRN: [id number] [facility] PROGRESS NOTE  Marshay Slates NWG:[id number] DOB: [birth year] DOA: 10/22/2015 PCP: Pcp Not In System  HPI/Subjective: Still has severe pain in the right great toe. Objective: Filed Vitals:   10/30/15 1400 10/30/15 1406  BP: 118/54 153/60  Pulse: 64 65  Temp:  98.9 F (37.2 C)  Resp: 14 16    Filed Weights   10/29/15 0334 10/30/15 1020 10/30/15 1406  Weight: 138 lb 14.4 oz (63.005 kg) 138 lb 14.4 oz (63.005 kg) 138 lb 14.2 oz (63 kg)    ROS: Review of Systems  Constitutional: Negative for fever, chills and weight loss.  HENT: Negative for congestion.   Eyes: Negative for blurred vision and double vision.  Respiratory: Negative for cough, sputum production, shortness of breath and wheezing.   Cardiovascular: Negative for chest pain, palpitations, orthopnea, leg swelling and PND.  Gastrointestinal: Negative for nausea, vomiting, abdominal pain, diarrhea, constipation and blood in stool.  Genitourinary: Negative for dysuria, urgency, frequency and hematuria.  Musculoskeletal: Negative for falls.  right great toe pain. Neurological: Negative for dizziness, tremors, focal weakness and headaches.  Endo/Heme/Allergies: Does not bruise/bleed easily.  Psychiatric/Behavioral: Negative for depression. The patient does not have insomnia.    Exam: Physical Exam  HENT:  Nose: No mucosal edema.  Mouth/Throat: No oropharyngeal exudate or posterior oropharyngeal edema.  Eyes: Conjunctivae, EOM and lids are normal. Pupils are equal, round, and reactive to light.  Neck: No JVD present. Carotid bruit is not present. No edema present. No thyroid mass and no thyromegaly present.  Cardiovascular: S1 normal and S2 normal.  Exam reveals no gallop.   Murmur heard.  Systolic murmur is present with a grade of 2/6  Pulses:      Dorsalis pedis pulses are not present.  Respiratory: No respiratory distress.  She has no wheezes. She has no rhonchi. She has no rales.  GI: Soft. Bowel sounds are normal. There is no tenderness.  Musculoskeletal:       Right ankle: She exhibits no swelling.       Left ankle: She exhibits no swelling.  Lymphadenopathy:    She has no cervical adenopathy.  Neurological: She is alert.  Skin: Skin is warm. no clubbing.  right great toe with dressing. Psychiatric: She has a normal mood and affect.  Patient is awaken and alert, answers questions appropriately    Data Reviewed: Basic Metabolic Panel:  Recent Labs Lab 10/25/15 1321 10/27/15 0951  NA 136 137  K 4.1 4.1  CL 95* 98*  CO2 29 31  GLUCOSE 136* 115*  BUN 42* 29*  CREATININE 7.00* 6.12*  CALCIUM 8.9 8.6*  PHOS 6.9* 5.7*   Liver Function Tests:  Recent Labs Lab 10/25/15 1321 10/27/15 0951  ALBUMIN 2.7* 2.6*   CBC:  Recent Labs Lab 10/24/15 0445 10/25/15 0454 10/25/15 1321 10/27/15 0951  WBC 4.5  --  4.6 4.5  HGB 11.3*  --  10.3* 9.5*  HCT 33.1*  --  30.8* 27.3*  MCV 97.0  --  98.2 97.4  PLT 128* 130* 127* 138*   Cardiac Enzymes: No results for input(s): CKTOTAL, CKMB, CKMBINDEX, TROPONINI in the last 168 hours.  CBG:  Recent Labs Lab 10/29/15 0724 10/29/15 1141 10/29/15 1621 10/29/15 2056 10/30/15 0732  GLUCAP 86 101* 91 119* 60*    Recent Results (from the past 240 hour(s))  Blood culture (routine x 2)     Status: None  Collection Time: 10/22/15  2:15 PM  Result Value Ref Range Status   Specimen Description BLOOD RIGHT ARM  Final   Special Requests BOTTLES DRAWN AEROBIC AND ANAEROBIC 6CC  Final   Culture NO GROWTH 5 DAYS  Final   Report Status 10/27/2015 FINAL  Final  Blood culture (routine x 2)     Status: None   Collection Time: 10/22/15  2:25 PM  Result Value Ref Range Status   Specimen Description BLOOD RIGHT ARM  Final   Special Requests BOTTLES DRAWN AEROBIC AND ANAEROBIC 6CC  Final   Culture NO GROWTH 5 DAYS  Final   Report Status 10/27/2015 FINAL   Final  MRSA PCR Screening     Status: None   Collection Time: 10/22/15 10:09 PM  Result Value Ref Range Status   MRSA by PCR NEGATIVE NEGATIVE Final    Comment:        The GeneXpert MRSA Assay (FDA approved for NASAL specimens only), is one component of a comprehensive MRSA colonization surveillance program. It is not intended to diagnose MRSA infection nor to guide or monitor treatment for MRSA infections.   Aerobic/Anaerobic Culture (surgical/deep wound)     Status: None (Preliminary result)   Collection Time: 10/26/15  3:42 PM  Result Value Ref Range Status   Specimen Description TOE RIGHT  Final   Special Requests NONE  Final   Gram Stain   Final    RARE WBC PRESENT, PREDOMINANTLY MONONUCLEAR ABUNDANT GRAM POSITIVE COCCI IN PAIRS MODERATE GRAM VARIABLE ROD Performed at [facility]    Culture   Final    FEW STENOTROPHOMONAS MALTOPHILIA NO ANAEROBES ISOLATED; CULTURE IN PROGRESS FOR 5 DAYS    Report Status PENDING  Incomplete   Organism ID, Bacteria STENOTROPHOMONAS MALTOPHILIA  Final      Susceptibility   Stenotrophomonas maltophilia - MIC*    LEVOFLOXACIN 0.25 SENSITIVE Sensitive     TRIMETH/SULFA <=20 SENSITIVE Sensitive     * FEW STENOTROPHOMONAS MALTOPHILIA     Studies: No results found.  Scheduled Meds: . aspirin EC  81 mg Oral Daily  . atorvastatin  20 mg Oral q1800  . calcium gluconate 1 GM IV  1 g Intravenous Once  . cinacalcet  90 mg Oral Q supper  . docusate sodium  100 mg Oral BID  . heparin subcutaneous  5,000 Units Subcutaneous Q8H  . [START ON 10/31/2015] levofloxacin  500 mg Oral Q48H  . mupirocin ointment   Topical BID  . mupirocin ointment   Nasal Daily  . senna  1 tablet Oral BID  . sevelamer carbonate  1,600 mg Oral TID WC  . sodium chloride flush  3 mL Intravenous Q12H  . sodium chloride flush  3 mL Intravenous Q12H  . venlafaxine XR  37.5 mg Oral Q breakfast    Assessment/Plan:  1. Right first toe pain due to Suspected  osteomyelitis or ischemia .  Continue pain medications. She was on heparin subcutaneously.  X-ray of the toe could not rule out an osteomyelitis. MRI report possible osteomyelitis.  she has been treated with  Zosyn, off vancomycin. Dr. Orland Jarredroxler suggests Levaquin orally based on the culture and also with the good bone penetrate ability to the Levaquin.  Continue with wet-to-dry saline dressings to the toe.  PVD. The patient underwent angiography July 12, sluggish flow was noted, due to severe peripheral vascular disease, not an amenable to intervention due to patient moving intermittently on procedure table, unable to get great toe amputated  due to severe peripheral vascular disease.  Per Dr. Evie Lacks, vascular surgeon, pt has limited options for revascularization at this point. If patient has uncontrolled pain and clear evidence of osteomyelitis consideration of reattempt under general anesthesia with endovascular therapy although likelihood of success is poor otherwise would recommend AKA.   Per Dr. Wyn Quaker, can try another angiogram but will need anesthesia tomorrow.  2. Hyperkalemia on presentation secondary to missed hemodialysis sessions. The patient underwent hemodialysis ,  improved.  3. Elevated troponin.  Patient not having chest pain or shortness of breath. Patient already on aspirin and metoprolol and statin. Cardiology consultation is appreciated, further evaluation of his pharmacologic nuclear stress test was recommended, possibly outpatient, echocardiogram revealed normal ejection fraction, LVH, grade 1 diastolic dysfunction.  4. End-stage renal disease. continue hemodialysis as per nephrology. 5. Anemia of chronic disease, stable 6. Hyperglycemia, BS is normal. Hemoglobin A1c is low patient is not a diabetic. 7     Essential hypertension, resumed lisinopril. continue lopressor.  F/u  Palliative care consult. I  Discussed with Dr. Thedore Mins. Code Status:     Code Status Orders         Start     Ordered   10/22/15 2049  Full code   Continuous     10/22/15 2048    Code Status History    Date Active Date Inactive Code Status Order ID Comments User Context   This patient has a current code status but no historical code status.     Family Communication: I discussed with her son. Disposition Plan: Plan is for patient to D/C to [facility] when stable. Clinical Social Worker (CSW) will continue to follow and assist as needed.   Consultants:  Cardiology  Nephrology  Podiatry  Vascular surgery  Antibiotics:  Zosyn  Time spent: 28 minutes  Shaune Pollack  [employer]

## 2015-10-30 NOTE — Care Management (Signed)
Patient seems to be disoriented and unable to carry on in conversation with any sense of what is happening. Family has made the executive for a DNR. Patient stated that she still is having pain in her foot, care staff is tending to her. Chaplain will continue rounds as technicians had to come in to freshen up the patient.

## 2015-10-30 NOTE — Progress Notes (Signed)
This note also relates to the following rows which could not be included: Pulse Rate - Cannot attach notes to unvalidated device data Resp - Cannot attach notes to unvalidated device data BP - Cannot attach notes to unvalidated device data SpO2 - Cannot attach notes to unvalidated device data   Hemodialysis start. 

## 2015-10-30 NOTE — Progress Notes (Addendum)
Plan is for patient to D/C to [facility] once medically stable. Per MD patient is not stable for D/C today. Clinical Child psychotherapistocial Worker (CSW) sent [facility] admissions coordinator a message making her aware of above. Palliative NP discussed case with CSW and brought up guardianship concerns and who is going to make decisions for patient's medical care. Per palliative NP patient's social security check is still going to patient's daughter in [place]. CSW contacted [facility], clinical (histocompatibility and immunogenetics[facility] [facility] worker and made him aware of above. Per [name] he will forward information to guardianship supervisor at APS. CSW will continue to follow and assist as needed.     Baker Hughes IncorporatedBailey Jahrell Hamor, LCSW [phone]

## 2015-10-31 ENCOUNTER — Encounter
Admission: EM | Disposition: A | Discharge status: Skilled Nursing Facility | Payer: Self-pay | Source: Home / Self Care | Attending: Internal Medicine

## 2015-10-31 ENCOUNTER — Encounter: Payer: Self-pay | Admitting: Anesthesiology

## 2015-10-31 DIAGNOSIS — Z7189 Other specified counseling: Secondary | ICD-10-CM

## 2015-10-31 DIAGNOSIS — Z515 Encounter for palliative care: Secondary | ICD-10-CM

## 2015-10-31 LAB — AEROBIC/ANAEROBIC CULTURE W GRAM STAIN (SURGICAL/DEEP WOUND)

## 2015-10-31 LAB — AEROBIC/ANAEROBIC CULTURE (SURGICAL/DEEP WOUND)

## 2015-10-31 LAB — GLUCOSE, CAPILLARY
GLUCOSE-CAPILLARY: 74 mg/dL (ref 65–99)
GLUCOSE-CAPILLARY: 119 mg/dL — AB (ref 65–99)
GLUCOSE-CAPILLARY: 90 mg/dL (ref 65–99)

## 2015-10-31 SURGERY — LOWER EXTREMITY ANGIOGRAPHY
Anesthesia: General | Laterality: Right

## 2015-10-31 MED ORDER — CHLORHEXIDINE GLUCONATE CLOTH 2 % EX PADS
6.0000 | MEDICATED_PAD | Freq: Once | CUTANEOUS | Status: AC
  Administered 2015-10-31: 6 via TOPICAL

## 2015-10-31 MED ORDER — SODIUM CHLORIDE 0.9 % IV SOLN: INTRAVENOUS | Status: DC

## 2015-10-31 MED ORDER — EPOETIN ALFA 4000 UNIT/ML IJ SOLN
4000.0000 [IU] | INTRAMUSCULAR | Status: DC
  Administered 2015-11-01 – 2015-11-06 (×3): 4000 [IU] via INTRAVENOUS
  Filled 2015-10-31 (×3): qty 1

## 2015-10-31 NOTE — Progress Notes (Signed)
Per Palliative NP patient does not require a guardian and can make her own decisions at this time. Plan is for patient to D/C to [facility] when stable. Clinical Child psychotherapistocial Worker (CSW) left APS worker Randi a voicemail making her aware of above. CSW will continue to follow and assist as needed.   Baker Hughes IncorporatedBailey Adrianah Prophete, LCSW [phone]

## 2015-10-31 NOTE — Progress Notes (Signed)
Patient was not npo, so procedure can not be done today under anesthesia. Will try to reschedule for tomorrow if anesthesia available.

## 2015-10-31 NOTE — Progress Notes (Signed)
Report given to oncoming nurse. PT in bed no distress noted. No changes form earlier assessments.

## 2015-10-31 NOTE — Progress Notes (Signed)
Subjective:  Patient is doing fair  No acute c/o No SOB Tolerated dialysis well yesterday Access was used - 1 needle Venous needle couldn't be placed  Objective:  Vital signs in last 24 hours:  Temp:  [97.8 F (36.6 C)-98.9 F (37.2 C)] 98.2 F (36.8 C) (07/19 0742) Pulse Rate:  [64-73] 67 (07/19 0742) Resp:  [14-20] 20 (07/19 0742) BP: (118-167)/(51-60) 157/52 mmHg (07/19 0742) SpO2:  [98 %-100 %] 100 % (07/19 0742) Weight:  [63 kg (138 lb 14.2 oz)] 63 kg (138 lb 14.2 oz) (07/18 1406)  Weight change:  Filed Weights   10/29/15 0334 10/30/15 1020 10/30/15 1406  Weight: 63.005 kg (138 lb 14.4 oz) 63.005 kg (138 lb 14.4 oz) 63 kg (138 lb 14.2 oz)    Intake/Output:    Intake/Output Summary (Last 24 hours) at 10/31/15 1309 Last data filed at 10/31/15 [id number]  Gross per 24 hour  Intake    340 ml  Output      1 ml  Net    339 ml     Physical Exam: General: NAD, laying in bed  HEENT anicteric  Neck supple  Pulm/lungs Clear, normal effort  CVS/Heart No rub or gallop  Abdomen:  Soft, NT  Extremities: No edema, rt toe bandage  Neurologic: Alert, able to answer questions  Skin: No acute issues  Access: Rt IJ PC, left arm AV access       Basic Metabolic Panel:   Recent Labs Lab 10/25/15 1321 10/27/15 0951  NA 136 137  K 4.1 4.1  CL 95* 98*  CO2 29 31  GLUCOSE 136* 115*  BUN 42* 29*  CREATININE 7.00* 6.12*  CALCIUM 8.9 8.6*  PHOS 6.9* 5.7*     CBC:  Recent Labs Lab 10/25/15 0454 10/25/15 1321 10/27/15 0951  WBC  --  4.6 4.5  HGB  --  10.3* 9.5*  HCT  --  30.8* 27.3*  MCV  --  98.2 97.4  PLT 130* 127* 138*      Microbiology:  Recent Results (from the past 720 hour(s))  Blood culture (routine x 2)     Status: None   Collection Time: 10/22/15  2:15 PM  Result Value Ref Range Status   Specimen Description BLOOD RIGHT ARM  Final   Special Requests BOTTLES DRAWN AEROBIC AND ANAEROBIC 6CC  Final   Culture NO GROWTH 5 DAYS  Final   Report  Status 10/27/2015 FINAL  Final  Blood culture (routine x 2)     Status: None   Collection Time: 10/22/15  2:25 PM  Result Value Ref Range Status   Specimen Description BLOOD RIGHT ARM  Final   Special Requests BOTTLES DRAWN AEROBIC AND ANAEROBIC 6CC  Final   Culture NO GROWTH 5 DAYS  Final   Report Status 10/27/2015 FINAL  Final  MRSA PCR Screening     Status: None   Collection Time: 10/22/15 10:09 PM  Result Value Ref Range Status   MRSA by PCR NEGATIVE NEGATIVE Final    Comment:        The GeneXpert MRSA Assay (FDA approved for NASAL specimens only), is one component of a comprehensive MRSA colonization surveillance program. It is not intended to diagnose MRSA infection nor to guide or monitor treatment for MRSA infections.   Aerobic/Anaerobic Culture (surgical/deep wound)     Status: None   Collection Time: 10/26/15  3:42 PM  Result Value Ref Range Status   Specimen Description TOE RIGHT  Final  Special Requests NONE  Final   Gram Stain   Final    RARE WBC PRESENT, PREDOMINANTLY MONONUCLEAR ABUNDANT GRAM POSITIVE COCCI IN PAIRS MODERATE GRAM VARIABLE ROD    Culture   Final    FEW STENOTROPHOMONAS MALTOPHILIA NO ANAEROBES ISOLATED Performed at [facility]    Report Status 10/31/2015 FINAL  Final   Organism ID, Bacteria STENOTROPHOMONAS MALTOPHILIA  Final      Susceptibility   Stenotrophomonas maltophilia - MIC*    LEVOFLOXACIN 0.25 SENSITIVE Sensitive     TRIMETH/SULFA <=20 SENSITIVE Sensitive     * FEW STENOTROPHOMONAS MALTOPHILIA    Coagulation Studies: No results for input(s): LABPROT, INR in the last 72 hours.  Urinalysis: No results for input(s): COLORURINE, LABSPEC, PHURINE, GLUCOSEU, HGBUR, BILIRUBINUR, KETONESUR, PROTEINUR, UROBILINOGEN, NITRITE, LEUKOCYTESUR in the last 72 hours.  Invalid input(s): APPERANCEUR    Imaging: No results found.   Medications:     . aspirin EC  81 mg Oral Daily  . atorvastatin  20 mg Oral q1800  . calcium  gluconate 1 GM IV  1 g Intravenous Once  . cinacalcet  90 mg Oral Q supper  . docusate sodium  100 mg Oral BID  . heparin subcutaneous  5,000 Units Subcutaneous Q8H  . levofloxacin  500 mg Oral Q48H  . lisinopril  10 mg Oral Daily  . metoprolol tartrate  12.5 mg Oral BID  . mupirocin ointment   Topical BID  . mupirocin ointment   Nasal Daily  . senna  1 tablet Oral BID  . sevelamer carbonate  1,600 mg Oral TID WC  . sodium chloride flush  3 mL Intravenous Q12H  . sodium chloride flush  3 mL Intravenous Q12H  . venlafaxine XR  37.5 mg Oral Q breakfast   sodium chloride, acetaminophen **OR** acetaminophen, HYDROcodone-acetaminophen, morphine injection, ondansetron **OR** ondansetron (ZOFRAN) IV, polyethylene glycol, sodium chloride flush  Assessment/ Plan:  70 y.o.African American female with hypertension, diabetes mellitus type II, hyperlipidemia, thyroid disorder and ESRD on hemodialysis. Pt recently moved from [place] via [place]. Now staying with her sister.   [facility] TTS [address]. RIJ permcath  1. End Stage Renal Disease with hyperkalemia on admission: Continue TTS schedule.  - Next HD planned for thursday  2.  Anemia of chronic kidney disease:  -  Low dose EPO with dialysis  3. Secondary Hyperparathyroidism: outpatient PTH >1360. Phos 5.7  - continue sevelamer for phos binding - cinacalcet  4. Peripheral Vascular disease: with pain in right toe.  - angiogram planned   LOS: 9 Gurtej Noyola 7/19/20171:09 PM

## 2015-10-31 NOTE — Care Management (Addendum)
Met with Dr. Chen. Patient for angiogram today with vascular; still with lots of pain. SNF pending; patient declined palliative services per CSW. CSW is following. No current RNCM needs.  

## 2015-10-31 NOTE — H&P (Signed)
  [facility] History & Physical Update  The patient was interviewed and re-examined.  The patient's previous History and Physical has been reviewed and is unchanged.  There is no change in the plan of care. We plan to proceed with the scheduled procedure.  DEW,JASON, MD  10/31/2015, 11:55 AM

## 2015-10-31 NOTE — Progress Notes (Signed)
   I had a long conversation and GOC meeting yesterday with this patient and her sister.  She was alert and oriented at that point in time and again today meeting with  Ms Deirdre Evenerelzer she is alert and oriented and making her own medical decisions with the support of her sister.  She has insight into her needs and agrees to SNF for rehabilitation on discharge.  She understands that her medical situation is serious.  At this time patient has the ability to make her own decisions.   Lorinda CreedMary Wesson Stith NP  Palliative Medicine Team Team Phone # [phone] Pager [phone]

## 2015-10-31 NOTE — Progress Notes (Signed)
Patient ID: Tracy Drake Jabbour, female   DOB: 03/05/1946, 70 y.o.   MRN: [id number] [facility] PROGRESS NOTE  Tracy Drake Scholes WUJ:[id number] DOB: 08/13/1945 DOA: 10/22/2015 PCP: Pcp Not In System  HPI/Subjective: Better pain in the right great toe. Objective: Filed Vitals:   10/31/15 0304 10/31/15 0742  BP: 157/51 157/52  Pulse: 73 67  Temp: 98.2 F (36.8 C) 98.2 F (36.8 C)  Resp:  20    Filed Weights   10/29/15 0334 10/30/15 1020 10/30/15 1406  Weight: 138 lb 14.4 oz (63.005 kg) 138 lb 14.4 oz (63.005 kg) 138 lb 14.2 oz (63 kg)    ROS: Review of Systems  Constitutional: Negative for fever, chills and weight loss.  HENT: Negative for congestion.   Eyes: Negative for blurred vision and double vision.  Respiratory: Negative for cough, sputum production, shortness of breath and wheezing.   Cardiovascular: Negative for chest pain, palpitations, orthopnea, leg swelling and PND.  Gastrointestinal: Negative for nausea, vomiting, abdominal pain, diarrhea, constipation and blood in stool.  Genitourinary: Negative for dysuria, urgency, frequency and hematuria.  Musculoskeletal: Negative for falls.  right great toe pain. Neurological: Negative for dizziness, tremors, focal weakness and headaches.  Endo/Heme/Allergies: Does not bruise/bleed easily.  Psychiatric/Behavioral: Negative for depression. The patient does not have insomnia.    Exam: Physical Exam  HENT:  Nose: No mucosal edema.  Mouth/Throat: No oropharyngeal exudate or posterior oropharyngeal edema.  Eyes: Conjunctivae, EOM and lids are normal. Pupils are equal, round, and reactive to light.  Neck: No JVD present. Carotid bruit is not present. No edema present. No thyroid mass and no thyromegaly present.  Cardiovascular: S1 normal and S2 normal.  Exam reveals no gallop.   Murmur heard.  Systolic murmur is present with a grade of 2/6  Pulses:      Dorsalis pedis pulses are not present.  Respiratory: No respiratory  distress. She has no wheezes. She has no rhonchi. She has no rales.  GI: Soft. Bowel sounds are normal. There is no tenderness.  Musculoskeletal:       Right ankle: She exhibits no swelling.       Left ankle: She exhibits no swelling.  Lymphadenopathy:    She has no cervical adenopathy.  Neurological: She is alert.  Skin: Skin is warm. no clubbing.  right great toe with dressing. Psychiatric: She has a normal mood and affect.  Patient is awaken and alert, answers questions appropriately    Data Reviewed: Basic Metabolic Panel:  Recent Labs Lab 10/25/15 1321 10/27/15 0951  NA 136 137  K 4.1 4.1  CL 95* 98*  CO2 29 31  GLUCOSE 136* 115*  BUN 42* 29*  CREATININE 7.00* 6.12*  CALCIUM 8.9 8.6*  PHOS 6.9* 5.7*   Liver Function Tests:  Recent Labs Lab 10/25/15 1321 10/27/15 0951  ALBUMIN 2.7* 2.6*   CBC:  Recent Labs Lab 10/25/15 0454 10/25/15 1321 10/27/15 0951  WBC  --  4.6 4.5  HGB  --  10.3* 9.5*  HCT  --  30.8* 27.3*  MCV  --  98.2 97.4  PLT 130* 127* 138*   Cardiac Enzymes: No results for input(s): CKTOTAL, CKMB, CKMBINDEX, TROPONINI in the last 168 hours.  CBG:  Recent Labs Lab 10/29/15 2056 10/30/15 0732 10/30/15 1618 10/30/15 2122 10/31/15 0738  GLUCAP 119* 60* 88 127* 74    Recent Results (from the past 240 hour(s))  Blood culture (routine x 2)     Status: None   Collection Time: 10/22/15  2:15 PM  Result Value Ref Range Status   Specimen Description BLOOD RIGHT ARM  Final   Special Requests BOTTLES DRAWN AEROBIC AND ANAEROBIC 6CC  Final   Culture NO GROWTH 5 DAYS  Final   Report Status 10/27/2015 FINAL  Final  Blood culture (routine x 2)     Status: None   Collection Time: 10/22/15  2:25 PM  Result Value Ref Range Status   Specimen Description BLOOD RIGHT ARM  Final   Special Requests BOTTLES DRAWN AEROBIC AND ANAEROBIC 6CC  Final   Culture NO GROWTH 5 DAYS  Final   Report Status 10/27/2015 FINAL  Final  MRSA PCR Screening      Status: None   Collection Time: 10/22/15 10:09 PM  Result Value Ref Range Status   MRSA by PCR NEGATIVE NEGATIVE Final    Comment:        The GeneXpert MRSA Assay (FDA approved for NASAL specimens only), is one component of a comprehensive MRSA colonization surveillance program. It is not intended to diagnose MRSA infection nor to guide or monitor treatment for MRSA infections.   Aerobic/Anaerobic Culture (surgical/deep wound)     Status: None   Collection Time: 10/26/15  3:42 PM  Result Value Ref Range Status   Specimen Description TOE RIGHT  Final   Special Requests NONE  Final   Gram Stain   Final    RARE WBC PRESENT, PREDOMINANTLY MONONUCLEAR ABUNDANT GRAM POSITIVE COCCI IN PAIRS MODERATE GRAM VARIABLE ROD    Culture   Final    FEW STENOTROPHOMONAS MALTOPHILIA NO ANAEROBES ISOLATED Performed at [facility]    Report Status 10/31/2015 FINAL  Final   Organism ID, Bacteria STENOTROPHOMONAS MALTOPHILIA  Final      Susceptibility   Stenotrophomonas maltophilia - MIC*    LEVOFLOXACIN 0.25 SENSITIVE Sensitive     TRIMETH/SULFA <=20 SENSITIVE Sensitive     * FEW STENOTROPHOMONAS MALTOPHILIA     Studies: No results found.  Scheduled Meds: . aspirin EC  81 mg Oral Daily  . atorvastatin  20 mg Oral q1800  . calcium gluconate 1 GM IV  1 g Intravenous Once  . cinacalcet  90 mg Oral Q supper  . docusate sodium  100 mg Oral BID  . [START ON 11/01/2015] epoetin (EPOGEN/PROCRIT) injection  4,000 Units Intravenous Q T,Th,Sa-HD  . heparin subcutaneous  5,000 Units Subcutaneous Q8H  . levofloxacin  500 mg Oral Q48H  . lisinopril  10 mg Oral Daily  . metoprolol tartrate  12.5 mg Oral BID  . mupirocin ointment   Topical BID  . mupirocin ointment   Nasal Daily  . senna  1 tablet Oral BID  . sevelamer carbonate  1,600 mg Oral TID WC  . sodium chloride flush  3 mL Intravenous Q12H  . sodium chloride flush  3 mL Intravenous Q12H  . venlafaxine XR  37.5 mg Oral Q breakfast     Assessment/Plan:  1. Right first toe pain due to Suspected osteomyelitis or ischemia .  Continue pain medications. She was on heparin subcutaneously.  X-ray of the toe could not rule out an osteomyelitis. MRI report possible osteomyelitis.  she has been treated with  Zosyn, off vancomycin. Dr. Orland Jarred suggests Levaquin orally based on the culture and also with the good bone penetrate ability to the Levaquin.  Continue with wet-to-dry saline dressings to the toe.  PVD. The patient underwent angiography July 12, sluggish flow was noted, due to severe peripheral vascular disease, not an  amenable to intervention due to patient moving intermittently on procedure table, unable to get great toe amputated due to severe peripheral vascular disease.  Per Dr. Evie Lacks, vascular surgeon, pt has limited options for revascularization at this point. If patient has uncontrolled pain and clear evidence of osteomyelitis consideration of reattempt under general anesthesia with endovascular therapy although likelihood of success is poor otherwise would recommend AKA.   Per Dr. Wyn Quaker, planed angiogram under anesthesia today, but pt ate, reschedule for tomorrow.  2. Hyperkalemia on presentation secondary to missed hemodialysis sessions. The patient underwent hemodialysis ,  improved.  3. Elevated troponin.  Patient not having chest pain or shortness of breath. Patient already on aspirin and metoprolol and statin. Cardiology consultation is appreciated, further evaluation of his pharmacologic nuclear stress test was recommended, possibly outpatient, echocardiogram revealed normal ejection fraction, LVH, grade 1 diastolic dysfunction.  4. End-stage renal disease. continue hemodialysis as per nephrology. 5. Anemia of chronic disease, stable 6. Hyperglycemia, BS is normal. Hemoglobin A1c is low patient is not a diabetic. 7     Essential hypertension, resumed lisinopril. continue lopressor.  Discussed with Palliative care  NP. I  Discussed with Dr. Thedore Mins. Code Status:  DNR. Family Communication: I discussed with her son. Disposition Plan: Plan is for patient to D/C to [facility] when stable. Clinical Social Worker (CSW) will continue to follow and assist as needed.   Consultants:  Cardiology  Nephrology  Podiatry  Vascular surgery  Antibiotics:  Zosyn  Time spent: 25 minutes  Shaune Pollack  [employer]

## 2015-10-31 NOTE — Progress Notes (Signed)
[facility] to patient bedside [facility], patient stated that she had just eaten lunch. Confirmed with bedside nurse that patient had eaten breakfast and lunch and was unaware of scheduled procedure. MD notified and procedure cancelled at this time, bedside nurse notified.

## 2015-11-01 ENCOUNTER — Inpatient Hospital Stay: Payer: Medicare Other | Admitting: Anesthesiology

## 2015-11-01 ENCOUNTER — Encounter
Admission: EM | Disposition: A | Discharge status: Skilled Nursing Facility | Payer: Self-pay | Source: Home / Self Care | Attending: Internal Medicine

## 2015-11-01 HISTORY — PX: PERIPHERAL VASCULAR CATHETERIZATION: SHX172C

## 2015-11-01 LAB — GLUCOSE, CAPILLARY
Glucose-Capillary: 71 mg/dL (ref 65–99)
Glucose-Capillary: 113 mg/dL — ABNORMAL HIGH (ref 65–99)
Glucose-Capillary: 93 mg/dL (ref 65–99)

## 2015-11-01 SURGERY — LOWER EXTREMITY ANGIOGRAPHY
Anesthesia: General | Laterality: Right

## 2015-11-01 MED ORDER — OXYCODONE HCL 5 MG PO TABS: 5.0000 mg | ORAL_TABLET | Freq: Once | ORAL | Status: DC | PRN

## 2015-11-01 MED ORDER — PROPOFOL 500 MG/50ML IV EMUL
INTRAVENOUS | Status: DC | PRN
  Administered 2015-11-01: 80 ug/kg/min via INTRAVENOUS

## 2015-11-01 MED ORDER — NEOSTIGMINE METHYLSULFATE 10 MG/10ML IV SOLN
INTRAVENOUS | Status: DC | PRN
  Administered 2015-11-01: 2.5 mg via INTRAVENOUS

## 2015-11-01 MED ORDER — PROPOFOL 10 MG/ML IV BOLUS
INTRAVENOUS | Status: DC | PRN
  Administered 2015-11-01: 30 mg via INTRAVENOUS

## 2015-11-01 MED ORDER — IOPAMIDOL (ISOVUE-300) INJECTION 61%
INTRAVENOUS | Status: DC | PRN
Start: 2015-11-01 — End: 2015-11-01
  Administered 2015-11-01: 120 mL via INTRA_ARTERIAL

## 2015-11-01 MED ORDER — KETAMINE HCL 50 MG/ML IJ SOLN
INTRAMUSCULAR | Status: DC | PRN
  Administered 2015-11-01: 25 mg via INTRAMUSCULAR

## 2015-11-01 MED ORDER — HEPARIN SODIUM (PORCINE) 1000 UNIT/ML IJ SOLN
INTRAMUSCULAR | Status: DC | PRN
  Administered 2015-11-01: 2000 [IU] via INTRAVENOUS
  Administered 2015-11-01: 5000 [IU] via INTRAVENOUS

## 2015-11-01 MED ORDER — OXYCODONE HCL 5 MG/5ML PO SOLN: 5.0000 mg | Freq: Once | ORAL | Status: DC | PRN

## 2015-11-01 MED ORDER — GLYCOPYRROLATE 0.2 MG/ML IJ SOLN
INTRAMUSCULAR | Status: DC | PRN
  Administered 2015-11-01: .1 mg via INTRAVENOUS

## 2015-11-01 MED ORDER — PHENYLEPHRINE HCL 10 MG/ML IJ SOLN
INTRAMUSCULAR | Status: DC | PRN
  Administered 2015-11-01: 100 ug via INTRAVENOUS

## 2015-11-01 MED ORDER — SUCCINYLCHOLINE CHLORIDE 20 MG/ML IJ SOLN
INTRAMUSCULAR | Status: DC | PRN
  Administered 2015-11-01: 100 mg via INTRAVENOUS

## 2015-11-01 MED ORDER — FENTANYL CITRATE (PF) 100 MCG/2ML IJ SOLN
25.0000 ug | INTRAMUSCULAR | Status: DC | PRN
  Administered 2015-11-01 (×2): 25 ug via INTRAVENOUS

## 2015-11-01 MED ORDER — ROCURONIUM BROMIDE 100 MG/10ML IV SOLN
INTRAVENOUS | Status: DC | PRN
  Administered 2015-11-01 (×2): 10 mg via INTRAVENOUS

## 2015-11-01 MED ORDER — EPHEDRINE SULFATE 50 MG/ML IJ SOLN
INTRAMUSCULAR | Status: DC | PRN
  Administered 2015-11-01 (×2): 10 mg via INTRAVENOUS
  Administered 2015-11-01: 15 mg via INTRAVENOUS

## 2015-11-01 MED ORDER — FENTANYL CITRATE (PF) 100 MCG/2ML IJ SOLN
INTRAMUSCULAR | Status: DC | PRN
  Administered 2015-11-01 (×2): 50 ug via INTRAVENOUS

## 2015-11-01 MED ORDER — HEPARIN (PORCINE) IN NACL 2-0.9 UNIT/ML-% IJ SOLN
INTRAMUSCULAR | Status: AC
  Filled 2015-11-01: qty 1000

## 2015-11-01 MED ORDER — LIDOCAINE-EPINEPHRINE (PF) 1 %-1:200000 IJ SOLN
INTRAMUSCULAR | Status: AC
  Filled 2015-11-01: qty 30

## 2015-11-01 MED ORDER — MEPERIDINE HCL 25 MG/ML IJ SOLN: 6.2500 mg | INTRAMUSCULAR | Status: DC | PRN

## 2015-11-01 MED ORDER — PROMETHAZINE HCL 25 MG/ML IJ SOLN
6.2500 mg | INTRAMUSCULAR | Status: DC | PRN
  Filled 2015-11-01: qty 1

## 2015-11-01 MED ORDER — CLOPIDOGREL BISULFATE 75 MG PO TABS
75.0000 mg | ORAL_TABLET | Freq: Every day | ORAL | Status: DC
Start: 2015-11-01 — End: 2015-11-07
  Administered 2015-11-01 – 2015-11-07 (×5): 75 mg via ORAL
  Filled 2015-11-01 (×6): qty 1

## 2015-11-01 SURGICAL SUPPLY — 36 items
BALLN LUTONIX 4X150X130 (BALLOONS) ×4
BALLN LUTONIX 5X150X130 (BALLOONS) ×4
BALLN LUTONIX DCB 5X80X130 (BALLOONS) ×4
BALLN ULTRVRSE 2X150X150 (BALLOONS) ×2
BALLN ULTRVRSE 2X150X150 OTW (BALLOONS) ×2
BALLN ULTRVRSE 3X300X150 (BALLOONS) ×2
BALLN ULTRVRSE 3X300X150 OTW (BALLOONS) ×2
BALLN ULTRVRSE 5X220X150 (BALLOONS) ×4
BALLOON LUTONIX 4X150X130 (BALLOONS) ×2 IMPLANT
BALLOON LUTONIX 5X150X130 (BALLOONS) ×2 IMPLANT
BALLOON LUTONIX DCB 5X80X130 (BALLOONS) ×2 IMPLANT
BALLOON ULTRVRSE 2X150X150 OTW (BALLOONS) ×2 IMPLANT
BALLOON ULTRVRSE 3X300X150 OTW (BALLOONS) ×2 IMPLANT
BALLOON ULTRVRSE 5X220X150 (BALLOONS) ×2 IMPLANT
CATH CXI 4F 90 DAV (CATHETERS) ×4 IMPLANT
CATH KA2 5FR 65CM (CATHETERS) ×4 IMPLANT
CATH RIM 65CM (CATHETERS) ×4 IMPLANT
DEVICE PRESTO INFLATION (MISCELLANEOUS) ×4 IMPLANT
DEVICE SAFEGUARD 24CM (GAUZE/BANDAGES/DRESSINGS) ×4 IMPLANT
DEVICE STARCLOSE SE CLOSURE (Vascular Products) IMPLANT
DEVICE TORQUE (MISCELLANEOUS) ×4 IMPLANT
DRAPE INCISE IOBAN 66X45 STRL (DRAPES) ×4 IMPLANT
ENSNARE 9-15 (MISCELLANEOUS) ×4 IMPLANT
GLIDECATH 4FR STR (CATHETERS) ×4 IMPLANT
GLIDESHEATH SLEND SS 6F .021 (SHEATH) ×4 IMPLANT
GLIDEWIRE ADV .035X260CM (WIRE) ×4 IMPLANT
NEEDLE ENTRY 21GA 7CM ECHOTIP (NEEDLE) ×4 IMPLANT
PACK ANGIOGRAPHY (CUSTOM PROCEDURE TRAY) ×4 IMPLANT
SHEATH ANL2 6FRX45 HC (SHEATH) ×4 IMPLANT
SHEATH BRITE TIP 4FRX11 (SHEATH) ×4 IMPLANT
SHEATH BRITE TIP 5FRX11 (SHEATH) ×8 IMPLANT
SHEATH MICROPUNCTURE PEDAL 4FR (SHEATH) ×4 IMPLANT
STENT LIFESTENT 6X170X130 (Permanent Stent) ×4 IMPLANT
TOWEL OR 17X26 4PK STRL BLUE (TOWEL DISPOSABLE) ×4 IMPLANT
WIRE G V18X300CM (WIRE) ×12 IMPLANT
WIRE J 3MM .035X145CM (WIRE) ×4 IMPLANT

## 2015-11-01 NOTE — Progress Notes (Signed)
HD tx ended 

## 2015-11-01 NOTE — Transfer of Care (Signed)
Immediate Anesthesia Transfer of Care Note  Patient: Tracy Drake  Procedure(s) Performed: Procedure(s): Lower Extremity Angiography (Right) Lower Extremity Intervention  Patient Location: PACU  Anesthesia Type:General  Level of Consciousness: awake and sedated  Airway & Oxygen Therapy: Patient Spontanous Breathing and Patient connected to face mask oxygen  Post-op Assessment: Report given to RN and Post -op Vital signs reviewed and stable  Post vital signs: Reviewed and stable  Last Vitals:  Filed Vitals:   11/01/15 0725 11/01/15 0835  BP: 122/64 185/63  Pulse: 73 69  Temp: 36.6 C   Resp: 18 16    Last Pain:  Filed Vitals:   11/01/15 1001  PainSc: 5       Patients Stated Pain Goal: 0 (10/22/15 1815)  Complications: No apparent anesthesia complications

## 2015-11-01 NOTE — Anesthesia Procedure Notes (Addendum)
Date/Time: 11/01/2015 9:41 AM Performed by: Junious SilkNOLES, Nikodem Leadbetter Pre-anesthesia Checklist: Patient identified, Emergency Drugs available, Suction available, Patient being monitored and Timeout performed Oxygen Delivery Method: Simple face mask   Procedure Name: Intubation Date/Time: 11/01/2015 10:13 AM Performed by: Junious SilkNOLES, Maniya Donovan Pre-anesthesia Checklist: Patient identified, Patient being monitored, Timeout performed, Emergency Drugs available and Suction available Patient Re-evaluated:Patient Re-evaluated prior to inductionOxygen Delivery Method: Circle system utilized Preoxygenation: Pre-oxygenation with 100% oxygen Intubation Type: IV induction Ventilation: Mask ventilation without difficulty Laryngoscope Size: Mac and 3 Grade View: Grade I Tube type: Oral Tube size: 7.0 mm Number of attempts: 1 Airway Equipment and Method: Stylet Placement Confirmation: ETT inserted through vocal cords under direct vision,  positive ETCO2 and breath sounds checked- equal and bilateral Secured at: 21 cm Tube secured with: Tape Dental Injury: Teeth and Oropharynx as per pre-operative assessment

## 2015-11-01 NOTE — Progress Notes (Signed)
PRE HD assessment 

## 2015-11-01 NOTE — Progress Notes (Signed)
Patient ID: Tracy Drake, female   DOB: [birth year], 70 y.o.   MRN: [id number] [employer] PROGRESS NOTE  Tracy Drake WUJ:[id number] DOB: 10/26/1945 DOA: 10/22/2015 PCP: Pcp Not In System  HPI/Subjective: Pt is confused after PTA with stent placement today.  Objective: Filed Vitals:   11/01/15 1332 11/01/15 1400  BP: 129/61 162/50  Pulse: 48 51  Temp: 97.1 F (36.2 C)   Resp: 17 16    Filed Weights   10/29/15 0334 10/30/15 1020 10/30/15 1406  Weight: 138 lb 14.4 oz (63.005 kg) 138 lb 14.4 oz (63.005 kg) 138 lb 14.2 oz (63 kg)    ROS: Review of Systems  Constitutional: Negative for fever, chills and weight loss.  HENT: Negative for congestion.   Eyes: Negative for blurred vision and double vision.  Respiratory: Negative for cough, sputum production, shortness of breath and wheezing.   Cardiovascular: Negative for chest pain, palpitations, orthopnea, leg swelling and PND.  Gastrointestinal: Negative for nausea, vomiting, abdominal pain, diarrhea, constipation and blood in stool.  Genitourinary: Negative for dysuria, urgency, frequency and hematuria.  Musculoskeletal: Negative for falls.  right great toe pain. Neurological: Negative for dizziness, tremors, focal weakness and headaches.  Endo/Heme/Allergies: Does not bruise/bleed easily.  Psychiatric/Behavioral: Negative for depression. The patient does not have insomnia.    Exam: Physical Exam  HENT:  Nose: No mucosal edema.  Mouth/Throat: No oropharyngeal exudate or posterior oropharyngeal edema.  Eyes: Conjunctivae, EOM and lids are normal. Pupils are equal, round, and reactive to light.  Neck: No JVD present. Carotid bruit is not present. No edema present. No thyroid mass and no thyromegaly present.  Cardiovascular: S1 normal and S2 normal.  Exam reveals no gallop.   Murmur heard.  Systolic murmur is present with a grade of 2/6  Pulses:      Dorsalis pedis pulses are not present.  Respiratory: No respiratory  distress. She has no wheezes. She has no rhonchi. She has no rales.  GI: Soft. Bowel sounds are normal. There is no tenderness.  Musculoskeletal:       Right ankle: She exhibits no swelling.       Left ankle: She exhibits no swelling.  Lymphadenopathy:    She has no cervical adenopathy.  Neurological: She is alert.  Skin: Skin is warm. no clubbing.  right great toe with dressing. Psychiatric: She has a normal mood and affect.  Patient is awaken and alert, answers questions appropriately    Data Reviewed: Basic Metabolic Panel:  Recent Labs Lab 10/27/15 0951  NA 137  K 4.1  CL 98*  CO2 31  GLUCOSE 115*  BUN 29*  CREATININE 6.12*  CALCIUM 8.6*  PHOS 5.7*   Liver Function Tests:  Recent Labs Lab 10/27/15 0951  ALBUMIN 2.6*   CBC:  Recent Labs Lab 10/27/15 0951  WBC 4.5  HGB 9.5*  HCT 27.3*  MCV 97.4  PLT 138*   Cardiac Enzymes: No results for input(s): CKTOTAL, CKMB, CKMBINDEX, TROPONINI in the last 168 hours.  CBG:  Recent Labs Lab 10/31/15 0738 10/31/15 1630 10/31/15 2129 11/01/15 0747 11/01/15 1356  GLUCAP 74 119* 90 71 113*    Recent Results (from the past 240 hour(s))  MRSA PCR Screening     Status: None   Collection Time: 10/22/15 10:09 PM  Result Value Ref Range Status   MRSA by PCR NEGATIVE NEGATIVE Final    Comment:        The GeneXpert MRSA Assay (FDA approved for NASAL specimens only), is one  component of a comprehensive MRSA colonization surveillance program. It is not intended to diagnose MRSA infection nor to guide or monitor treatment for MRSA infections.   Aerobic/Anaerobic Culture (surgical/deep wound)     Status: None   Collection Time: 10/26/15  3:42 PM  Result Value Ref Range Status   Specimen Description TOE RIGHT  Final   Special Requests NONE  Final   Gram Stain   Final    RARE WBC PRESENT, PREDOMINANTLY MONONUCLEAR ABUNDANT GRAM POSITIVE COCCI IN PAIRS MODERATE GRAM VARIABLE ROD    Culture   Final    FEW  STENOTROPHOMONAS MALTOPHILIA NO ANAEROBES ISOLATED Per[facility]    Report Status 10/31/2015 FINAL  Final   Organism ID, Bacteria STENOTROPHOMONAS MALTOPHILIA  Final      Susceptibility   Stenotrophomonas maltophilia - MIC*    LEVOFLOXACIN 0.25 SENSITIVE Sensitive     TRIMETH/SULFA <=20 SENSITIVE Sensitive     * FEW STENOTROPHOMONAS MALTOPHILIA     Studies: No results found.  Scheduled Meds: . aspirin EC  81 mg Oral Daily  . atorvastatin  20 mg Oral q1800  . calcium gluconate 1 GM IV  1 g Intravenous Once  . cinacalcet  90 mg Oral Q supper  . clopidogrel  75 mg Oral Daily  . docusate sodium  100 mg Oral BID  . epoetin (EPOGEN/PROCRIT) injection  4,000 Units Intravenous Q T,Th,Sa-HD  . heparin subcutaneous  5,000 Units Subcutaneous Q8H  . levofloxacin  500 mg Oral Q48H  . lisinopril  10 mg Oral Daily  . metoprolol tartrate  12.5 mg Oral BID  . mupirocin ointment   Topical BID  . mupirocin ointment   Nasal Daily  . senna  1 tablet Oral BID  . sevelamer carbonate  1,600 mg Oral TID WC  . sodium chloride flush  3 mL Intravenous Q12H  . sodium chloride flush  3 mL Intravenous Q12H  . venlafaxine XR  37.5 mg Oral Q breakfast    Assessment/Plan:  1. Right first toe pain due to Suspected osteomyelitis or ischemia .  Continue pain medications. She was on heparin subcutaneously.  X-ray of the toe could not rule out an osteomyelitis. MRI report possible osteomyelitis.  she has been treated with  Zosyn, off vancomycin. Dr. Orland Jarredroxler suggests Levaquin orally based on the culture and also with the good bone penetrate ability to the Levaquin.  Continue with wet-to-dry saline dressings to the toe.  PAD with gangrene right foot.  The patient underwent angiography July 12, sluggish flow was noted, due to severe peripheral vascular disease, not an amenable to intervention due to patient moving intermittently on procedure table, unable to get great toe amputated due to  severe peripheral vascular disease.  Per Dr. Evie LacksEsco, vascular surgeon, pt has limited options for revascularization at this point. If patient has uncontrolled pain and clear evidence of osteomyelitis consideration of reattempt under general anesthesia with endovascular therapy although likelihood of success is poor otherwise would recommend AKA.  Angiogram today: SFA occlusion, diseased popliteal artery, occlusion of proximal AT artery and peroneal arteries. S/p PTA with stent placement by Dr. Wyn Quakerew today.  2. Hyperkalemia on presentation secondary to missed hemodialysis sessions. The patient underwent hemodialysis ,  improved.  3. Elevated troponin.  Patient not having chest pain or shortness of breath. Patient already on aspirin and metoprolol and statin. Cardiology consultation is appreciated, further evaluation of his pharmacologic nuclear stress test was recommended, possibly outpatient, echocardiogram revealed normal ejection fraction, LVH, grade  1 diastolic dysfunction.  4. End-stage renal disease. continue hemodialysis as per nephrology. 5. Anemia of chronic disease, stable 6. Hyperglycemia, BS is normal. Hemoglobin A1c is low patient is not a diabetic. 7     Essential hypertension, resumed lisinopril. continue lopressor.  Discussed with Palliative care NP. I  Discussed with Dr. Thedore Mins. Code Status:  DNR. Family Communication: I discussed with her son. Disposition Plan: Plan is for patient to D/C to [facility] when stable. Clinical Social Worker (CSW) will continue to follow and assist as needed.   Consultants:  Cardiology  Nephrology  Podiatry  Vascular surgery  Antibiotics:  Zosyn  Time spent: 25 minutes  Shaune Pollack  [employer]

## 2015-11-01 NOTE — Anesthesia Preprocedure Evaluation (Signed)
Anesthesia Evaluation  Patient identified by MRN, date of birth, ID band Patient awake    Reviewed: Allergy & Precautions, NPO status , Patient's Chart, lab work & pertinent test results  History of Anesthesia Complications Negative for: history of anesthetic complications  Airway Mallampati: III  TM Distance: >3 FB Neck ROM: Full    Dental  (+) Edentulous Lower, Edentulous Upper   Pulmonary neg COPD,    breath sounds clear to auscultation- rhonchi (-) wheezing      Cardiovascular Exercise Tolerance: Poor hypertension, + Peripheral Vascular Disease  (-) CAD and (-) Past MI  Rhythm:Regular Rate:Normal - Systolic murmurs and - Diastolic murmurs    Neuro/Psych CVA, No Residual Symptoms negative psych ROS   GI/Hepatic negative GI ROS, Neg liver ROS,   Endo/Other  diabetes  Renal/GU ESRF and DialysisRenal disease     Musculoskeletal negative musculoskeletal ROS (+)   Abdominal Normal abdominal exam  (+)   Peds  Hematology  (+) anemia ,   Anesthesia Other Findings Past Medical History:   Diabetes mellitus (HCC)                                        Comment:a. not on medications   History of stroke                                            ESRD (end stage renal disease) on dialysis (HC*              Essential hypertension                                       HLD (hyperlipidemia)                                         Diabetic neuropathy (HCC)                                    Reproductive/Obstetrics negative OB ROS                             Anesthesia Physical Anesthesia Plan  ASA: IV  Anesthesia Plan: General   Post-op Pain Management:    Induction: Intravenous  Airway Management Planned: Natural Airway and LMA  Additional Equipment:   Intra-op Plan:   Post-operative Plan:   Informed Consent: I have reviewed the patients History and Physical, chart, labs and discussed  the procedure including the risks, benefits and alternatives for the proposed anesthesia with the patient or authorized representative who has indicated his/her understanding and acceptance.     Plan Discussed with:   Anesthesia Plan Comments: (Discussed both with patient and sister Robert BellowDiane Bradley via phone about plan for anesthetic. May need LMA for procedure, will discuss with surgeon. Of note, patient has DNR order on her chart, but when I discussed this with both her and her sister neither of them understood what I was talking about. I discussed that in the setting of this  procedure and receiving anesthesia we would perform any necessary recusitation measures. )        Anesthesia Quick Evaluation

## 2015-11-01 NOTE — Progress Notes (Signed)
Subjective:  Patient is doing fair  Patient underwent angiogram today. Results are pending Patient seen on dialysis. Tolerating well   HEMODIALYSIS FLOWSHEET:  Blood Flow Rate (mL/min): 400 mL/min Arterial Pressure (mmHg): -190 mmHg Venous Pressure (mmHg): 160 mmHg Transmembrane Pressure (mmHg): 40 mmHg Ultrafiltration Rate (mL/min): 570 mL/min Dialysate Flow Rate (mL/min): 600 ml/min Conductivity: Machine : 14 Conductivity: Machine : 14 Dialysis Fluid Bolus: Normal Saline Bolus Amount (mL): 250 mL Dialysate Change: 2K Intra-Hemodialysis Comments: removed, pt stable    Objective:  Vital signs in last 24 hours:  Temp:  [96.8 F (36 C)-98.2 F (36.8 C)] 97.1 F (36.2 C) (07/20 1417) Pulse Rate:  [46-76] 54 (07/20 1630) Resp:  [10-27] 16 (07/20 1500) BP: (98-185)/(46-79) 111/57 mmHg (07/20 1630) SpO2:  [98 %-100 %] 100 % (07/20 1630)  Weight change:  Filed Weights   10/29/15 0334 10/30/15 1020 10/30/15 1406  Weight: 63.005 kg (138 lb 14.4 oz) 63.005 kg (138 lb 14.4 oz) 63 kg (138 lb 14.2 oz)    Intake/Output:    Intake/Output Summary (Last 24 hours) at 11/01/15 1658 Last data filed at 11/01/15 1327  Gross per 24 hour  Intake   1340 ml  Output     25 ml  Net   1315 ml     Physical Exam: General: NAD, laying in bed  HEENT anicteric  Neck supple  Pulm/lungs Clear, normal effort  CVS/Heart No rub or gallop  Abdomen:  Soft, NT  Extremities: No edema, rt toe bandage  Neurologic: Sleeping comfortably  Skin: No acute issues  Access: Rt IJ PC, left arm AV access       Basic Metabolic Panel:   Recent Labs Lab 10/27/15 0951  NA 137  K 4.1  CL 98*  CO2 31  GLUCOSE 115*  BUN 29*  CREATININE 6.12*  CALCIUM 8.6*  PHOS 5.7*     CBC:  Recent Labs Lab 10/27/15 0951  WBC 4.5  HGB 9.5*  HCT 27.3*  MCV 97.4  PLT 138*      Microbiology:  Recent Results (from the past 720 hour(s))  Blood culture (routine x 2)     Status: None    Collection Time: 10/22/15  2:15 PM  Result Value Ref Range Status   Specimen Description BLOOD RIGHT ARM  Final   Special Requests BOTTLES DRAWN AEROBIC AND ANAEROBIC 6CC  Final   Culture NO GROWTH 5 DAYS  Final   Report Status 10/27/2015 FINAL  Final  Blood culture (routine x 2)     Status: None   Collection Time: 10/22/15  2:25 PM  Result Value Ref Range Status   Specimen Description BLOOD RIGHT ARM  Final   Special Requests BOTTLES DRAWN AEROBIC AND ANAEROBIC 6CC  Final   Culture NO GROWTH 5 DAYS  Final   Report Status 10/27/2015 FINAL  Final  MRSA PCR Screening     Status: None   Collection Time: 10/22/15 10:09 PM  Result Value Ref Range Status   MRSA by PCR NEGATIVE NEGATIVE Final    Comment:        The GeneXpert MRSA Assay (FDA approved for NASAL specimens only), is one component of a comprehensive MRSA colonization surveillance program. It is not intended to diagnose MRSA infection nor to guide or monitor treatment for MRSA infections.   Aerobic/Anaerobic Culture (surgical/deep wound)     Status: None   Collection Time: 10/26/15  3:42 PM  Result Value Ref Range Status   Specimen Description TOE RIGHT  Final   Special Requests NONE  Final   Gram Stain   Final    RARE WBC PRESENT, PREDOMINANTLY MONONUCLEAR ABUNDANT GRAM POSITIVE COCCI IN PAIRS MODERATE GRAM VARIABLE ROD    Culture   Final    FEW STENOTROPHOMONAS MALTOPHILIA NO ANAEROBES ISOLATED [facility]    Report Status 10/31/2015 FINAL  Final   Organism ID, Bacteria STENOTROPHOMONAS MALTOPHILIA  Final      Susceptibility   Stenotrophomonas maltophilia - MIC*    LEVOFLOXACIN 0.25 SENSITIVE Sensitive     TRIMETH/SULFA <=20 SENSITIVE Sensitive     * FEW STENOTROPHOMONAS MALTOPHILIA    Coagulation Studies: No results for input(s): LABPROT, INR in the last 72 hours.  Urinalysis: No results for input(s): COLORURINE, LABSPEC, PHURINE, GLUCOSEU, HGBUR, BILIRUBINUR, KETONESUR, PROTEINUR,  UROBILINOGEN, NITRITE, LEUKOCYTESUR in the last 72 hours.  Invalid input(s): APPERANCEUR    Imaging: No results found.   Medications:     . aspirin EC  81 mg Oral Daily  . atorvastatin  20 mg Oral q1800  . calcium gluconate 1 GM IV  1 g Intravenous Once  . cinacalcet  90 mg Oral Q supper  . clopidogrel  75 mg Oral Daily  . docusate sodium  100 mg Oral BID  . epoetin (EPOGEN/PROCRIT) injection  4,000 Units Intravenous Q T,Th,Sa-HD  . heparin subcutaneous  5,000 Units Subcutaneous Q8H  . levofloxacin  500 mg Oral Q48H  . lisinopril  10 mg Oral Daily  . metoprolol tartrate  12.5 mg Oral BID  . mupirocin ointment   Topical BID  . mupirocin ointment   Nasal Daily  . senna  1 tablet Oral BID  . sevelamer carbonate  1,600 mg Oral TID WC  . sodium chloride flush  3 mL Intravenous Q12H  . sodium chloride flush  3 mL Intravenous Q12H  . venlafaxine XR  37.5 mg Oral Q breakfast   sodium chloride, acetaminophen **OR** acetaminophen, HYDROcodone-acetaminophen, morphine injection, ondansetron **OR** ondansetron (ZOFRAN) IV, polyethylene glycol, sodium chloride flush  Assessment/ Plan:  70 y.o.African American female with hypertension, diabetes mellitus type II, hyperlipidemia, thyroid disorder and ESRD on hemodialysis. [place] recently moved from [place] via [place]. Now staying with her sister.   [facility] [address] R[facility] permcath  1. End Stage Renal Disease with hyperkalemia on admission: Continue TTS schedule.  - Patient seen during dialysis Tolerating well   2.  Anemia of chronic kidney disease:  -  Low dose EPO with dialysis  3. Secondary Hyperparathyroidism: outpatient PTH >1360. Phos 5.7  - continue sevelamer for phos binding - cinacalcet  4. Peripheral Vascular disease: Osteomyelitis with pain in right toe.  - angiogram done earlier today   LOS: 10 Tracy Drake 7/20/20174:58 PM

## 2015-11-01 NOTE — Op Note (Signed)
[facility] Percutaneous Study/Intervention Procedural Note   Date of Surgery:  11/01/2015  Surgeon(s):DEW,JASON   Assistants:none  Pre-operative Diagnosis: PAD with gangrene right foot  Post-operative diagnosis: Same  Procedure(s) Performed: 1. Ultrasound guidance for vascular access left femoral artery and right AT artery 2. Catheter placement into right AT artery from left femoral approach, and into right common femoral artery from right AT approach 3. Selective right lower extremity angiogram 4. Percutaneous transluminal angioplasty of distal right anterior tibial artery with 2 mm diameter angioplasty balloon and proximal to mid anterior tibial artery 3 mm diameter angioplasty balloon 5. Percutaneous transluminal angioplasty of the proximal SFA with 5 mm x 15 cm Lutonix drug coated balloon and remainder of SFA and popliteal artery with 3m x 30 cm balloon  6.  Percutaneous transluminal angioplasty of the right profunda femoris artery with 5 mm diameter by 8 cm length Lutonix drug-coated balloon 7. Stent  Placement to the proximal right SFA for dissection and greater than 50% residual stenosis after angioplasty with a 6 mm diameter by 15 cm length stent  EBL: 25 cc  Contrast: 120 cc  Fluoro Time: 29.9 minutes  Anesthesia: general  Indications: Patient is a 70y.o.female with a gangrenous toe. The patient has had an angiogram but her continued motion made it impossible to perform any intervention. The patient is brought in for angiography for further evaluation and potential treatment, this time under anesthesia. Risks and benefits are discussed and informed consent is obtained  Procedure: The patient was identified and appropriate procedural time out was performed. The patient was then placed supine on the table and prepped and draped in the usual sterile  fashion.Moderate conscious sedation was administered during a face to face encounter with the patient throughout the procedure with my supervision of the RN administering medicines and monitoring the patient's vital signs, pulse oximetry, telemetry and mental status throughout from the start of the procedure until the patient was taken to the recovery room. Ultrasound was used to evaluate the left common femoral artery. It was patent . A digital ultrasound image was acquired. A Seldinger needle was used to access the left common femoral artery under direct ultrasound guidance and a permanent image was performed. A 0.035 J wire was advanced without resistance and a 5Fr sheath was placed. I then crossed the aortic bifurcation and advanced to the right femoral head. Selective right lower extremity angiogram was then performed. This demonstrated SFA occlusion, diseased popliteal artery, occlusion of proximal AT artery and peroneal arteries. The patient was systemically heparinized and a 6 [place] sheath was then placed over the TGenworth Financialwire. I then used a Kumpe catheter and the advantage wire to initially try to navigate through the flush SFA occlusion. Even without motion, initially this was unsuccessful. I was never able to cross the occlusion again intraluminal flow in the SFA or popliteal artery distally. I then elected to perform a pedal access since she was under anesthesia. The anterior tibial artery was seen to be the best runoff to the foot although it was occluded proximally. This was accessed at the level of the ankle with a micropuncture needle under direct ultrasound guidance and a permanent image was recorded. A micropuncture wire and sheath were then placed and we upsized to a 4 FPakistansheath. From the retrograde approach, I was able to cross the anterior tibial artery occlusion without difficulty and balloon the mid and distal anterior tibial artery with 3 mm diameter by 30  cm length  angioplasty balloon. This resulted in a nonflow limiting dissection and less than 40% residual stenosis. I then took the advantage wire and a CXI catheter navigated through the popliteal and SFA occlusions, but was never able to gain intraluminal access in the common femoral artery. I tried to use a snare from the left femoral approach to get the wire but this was unsuccessful. After proceeding on this for several minutes, imaging through the right common femoral artery from the left femoral sheath showed significant dissection and this tracked down into the profunda femoris artery. Through the left femoral sheath I advanced a CXI catheter and the advantage wire down into the profunda femoris artery beyond the stenosis and confirm intraluminal flow. The catheter was then removed and the wire was replaced and a 5 mm diameter by 8 cm length Lutonix drug-coated angioplasty balloon were used to treat the right profunda femoris artery and common femoral artery from the left femoral approach. This was inflated to 8 atm for 1 minute. Following this, there was good flow in one of the 2 main profunda femoris artery branches with only about a 20-30% residual stenosis. I then passed the advantage wire and catheter and was able to get into the native SFA on the right from the left femoral approach and advanced a CXI catheter down into the popliteal artery below the occlusion and confirm intraluminal flow. An 018 wire was then placed. The anterior tibial sheath was removed and a wire was taken across the anterior tibial artery in the foot. At the area the sheath was removed a 2 mm diameter by 10 cm length angioplasty balloon was used to treat the anterior tibial artery and provide hemostasis. I then used a 5 mm diameter 30 cm length 018 angioplasty balloon from the popliteal artery at the knee up to the proximal SFA. This inflation was to 8 atm for 1 minute. A 5 mm diameter by 15 cm length Lutonix drug-coated angioplasty balloon  was then used to treat the proximal SFA and common femoral artery. This inflation was to 12 atm for 1 minute. Although the mid to distal SFA and popliteal artery appeared to have less than 20-25% residual stenosis, the proximal SFA had a high-grade residual stenosis and spiral dissection. I selected a 6 mm diameter by 17 cm length self-expanding stent in the proximal SFA and postdilated this with a 5 mm balloon. Completion angiogram showed brisk flow through the stent with less than 20% residual stenosis. I felt we had improved her perfusion is much as possible.. I elected to terminate the procedure. The sheath was removed from the left femoral artery and pressure was held with excellent hemostatic result. The patient was taken to the recovery room in stable condition having tolerated the procedure well.  Findings:   Right Lower Extremity: SFA occlusion, diseased popliteal artery, occlusion of proximal AT artery and peroneal arteries.   Disposition: Patient was taken to the recovery room in stable condition having tolerated the procedure well.  Complications: None  DEW,JASON 11/01/2015 11:55 AM

## 2015-11-01 NOTE — Progress Notes (Signed)
Pt going to vascular today.  Will f/u tomorrow.

## 2015-11-01 NOTE — Progress Notes (Signed)
TX started 

## 2015-11-01 NOTE — H&P (Signed)
  [facility] History & Physical Update  The patient was interviewed and re-examined.  The patient's previous History and Physical has been reviewed and is unchanged.  There is no change in the plan of care. We plan to proceed with the scheduled procedure.  Joren Rehm, MD  11/01/2015, 9:03 AM

## 2015-11-01 NOTE — Progress Notes (Signed)
Initial Nutrition Assessment  DOCUMENTATION CODES:   Not applicable  INTERVENTION:   Await diet progression s/p procedure Recommend Nepro Shake po BID, each supplement provides 425 kcal and 19 grams protein   NUTRITION DIAGNOSIS:   Increased nutrient needs related to wound healing as evidenced by estimated needs.  GOAL:   Patient will meet greater than or equal to 90% of their needs  MONITOR:   PO intake, Supplement acceptance, Labs, Weight trends, I & O's  REASON FOR ASSESSMENT:   LOS    ASSESSMENT:   Tracy Drake admitted with gangrenous toe; scheduled for vascular intervention 7/20.  Tracy Drake with h/o ESRD on HD.  Tracy Drake out of room times two today on rounds at vascular procedure.  Past Medical History  Diagnosis Date  . Diabetes mellitus (HCC)     a. not on medications  . History of stroke   . ESRD (end stage renal disease) on dialysis (HCC)   . Essential hypertension   . HLD (hyperlipidemia)   . Diabetic neuropathy (HCC)     Diet Order:   NPO currently, Previously Renal/Carb diet order  Tracy Drake out of room today on rounds times two. Per documentation Tracy Drake eating roughly 50% of meals recorded. Per MST Tracy Drake with decreased appetite PTA as well.  Medications: Calcium gluconate, Colace, Senna, Renvela, Sensipar Labs: (7/15) BUN 29, Cre 6.12, Phosphorus 5.7, glucose 115   Gastrointestinal Profile: Last BM:  10/31/2015   Nutrition-Focused Physical Exam Findings:  Unable to complete Nutrition-Focused physical exam at this time.    Weight Change:  Filed Weights   10/29/15 0334 10/30/15 1020 10/30/15 1406  Weight: 138 lb 14.4 oz (63.005 kg) 138 lb 14.4 oz (63.005 kg) 138 lb 14.2 oz (63 kg)    Skin:   (Stage II hip pressure ulcer)   Height:   Ht Readings from Last 1 Encounters:  10/22/15 5\' 2"  (1.575 m)    Weight:   Wt Readings from Last 1 Encounters:  10/30/15 138 lb 14.2 oz (63 kg)    BMI:  Body mass index is 25.4 kg/(m^2).  Estimated Nutritional Needs:   Kcal:   1890-2200kcals  Protein:  75-100g protein  Fluid:  UOP+1L  EDUCATION NEEDS:   Education needs no appropriate at this time  Leda QuailAllyson Farrie Sann, RD, LDN Pager [phone] Weekend/On-Call Pager [phone]

## 2015-11-01 NOTE — Progress Notes (Signed)
POST HD assessment 

## 2015-11-01 NOTE — Anesthesia Postprocedure Evaluation (Signed)
Anesthesia Post Note  Patient: Administrator, Tracy Drake  Procedure(s) Performed: Procedure(s) (LRB): Lower Extremity Angiography (Right) Lower Extremity Intervention  Patient location during evaluation: PACU Anesthesia Type: General Level of consciousness: awake and alert Pain management: pain level controlled Vital Signs Assessment: post-procedure vital signs reviewed and stable Respiratory status: spontaneous breathing, nonlabored ventilation, respiratory function stable and patient connected to nasal cannula oxygen Cardiovascular status: blood pressure returned to baseline and stable Postop Assessment: no signs of nausea or vomiting Anesthetic complications: no    Last Vitals:  Filed Vitals:   11/01/15 1319 11/01/15 1321  BP: 122/47 136/54  Pulse: 46 46  Temp:    Resp: 12 14    Last Pain:  Filed Vitals:   11/01/15 1326  PainSc: Asleep                 [name]

## 2015-11-01 NOTE — Progress Notes (Signed)
Post Hd   

## 2015-11-01 NOTE — Progress Notes (Signed)
PRE HD   

## 2015-11-02 ENCOUNTER — Inpatient Hospital Stay: Payer: Medicare Other | Admitting: Registered Nurse

## 2015-11-02 ENCOUNTER — Encounter: Payer: Self-pay | Admitting: Vascular Surgery

## 2015-11-02 ENCOUNTER — Encounter
Admission: EM | Disposition: A | Discharge status: Skilled Nursing Facility | Payer: Self-pay | Source: Home / Self Care | Attending: Internal Medicine

## 2015-11-02 HISTORY — PX: AMPUTATION TOE: SHX6595

## 2015-11-02 LAB — GLUCOSE, CAPILLARY
GLUCOSE-CAPILLARY: 92 mg/dL (ref 65–99)
GLUCOSE-CAPILLARY: 88 mg/dL (ref 65–99)
Glucose-Capillary: 109 mg/dL — ABNORMAL HIGH (ref 65–99)
Glucose-Capillary: 63 mg/dL — ABNORMAL LOW (ref 65–99)

## 2015-11-02 SURGERY — AMPUTATION, TOE
Anesthesia: General | Site: Toe | Laterality: Right | Wound class: Dirty or Infected

## 2015-11-02 MED ORDER — NEPRO/CARBSTEADY PO LIQD
237.0000 mL | Freq: Two times a day (BID) | ORAL | Status: DC
  Administered 2015-11-03 – 2015-11-04 (×3): 237 mL via ORAL
  Filled 2015-11-02 (×5): qty 237

## 2015-11-02 MED ORDER — PROPOFOL 500 MG/50ML IV EMUL
INTRAVENOUS | Status: DC | PRN
  Administered 2015-11-02: 125 ug/kg/min via INTRAVENOUS

## 2015-11-02 MED ORDER — ONDANSETRON HCL 4 MG PO TABS: 4.0000 mg | ORAL_TABLET | Freq: Four times a day (QID) | ORAL | Status: DC | PRN

## 2015-11-02 MED ORDER — FENTANYL CITRATE (PF) 100 MCG/2ML IJ SOLN
INTRAMUSCULAR | Status: DC | PRN
  Administered 2015-11-02 (×2): 1 ug via INTRAVENOUS

## 2015-11-02 MED ORDER — BUPIVACAINE HCL 0.5 % IJ SOLN
INTRAMUSCULAR | Status: DC | PRN
  Administered 2015-11-02: 5 mL

## 2015-11-02 MED ORDER — CHLORHEXIDINE GLUCONATE CLOTH 2 % EX PADS
6.0000 | MEDICATED_PAD | Freq: Once | CUTANEOUS | Status: AC
  Administered 2015-11-02: 6 via TOPICAL

## 2015-11-02 MED ORDER — ONDANSETRON HCL 4 MG/2ML IJ SOLN: 4.0000 mg | Freq: Once | INTRAMUSCULAR | Status: DC | PRN

## 2015-11-02 MED ORDER — LIDOCAINE HCL (PF) 1 % IJ SOLN
INTRAMUSCULAR | Status: DC | PRN
  Administered 2015-11-02: 5 mL

## 2015-11-02 MED ORDER — PROPOFOL 10 MG/ML IV BOLUS
INTRAVENOUS | Status: DC | PRN
  Administered 2015-11-02: 20 mg via INTRAVENOUS

## 2015-11-02 MED ORDER — SODIUM CHLORIDE 0.9 % IV SOLN
INTRAVENOUS | Status: DC
  Administered 2015-11-02 – 2015-11-04 (×3): via INTRAVENOUS

## 2015-11-02 MED ORDER — FENTANYL CITRATE (PF) 100 MCG/2ML IJ SOLN: 25.0000 ug | INTRAMUSCULAR | Status: DC | PRN

## 2015-11-02 MED ORDER — EPHEDRINE SULFATE 50 MG/ML IJ SOLN
INTRAMUSCULAR | Status: DC | PRN
  Administered 2015-11-02: 15 mg via INTRAVENOUS
  Administered 2015-11-02: 10 mg via INTRAVENOUS

## 2015-11-02 MED ORDER — ONDANSETRON HCL 4 MG/2ML IJ SOLN: 4.0000 mg | Freq: Four times a day (QID) | INTRAMUSCULAR | Status: DC | PRN

## 2015-11-02 MED ORDER — PHENYLEPHRINE HCL 10 MG/ML IJ SOLN
INTRAMUSCULAR | Status: DC | PRN
  Administered 2015-11-02 (×2): 100 ug via INTRAVENOUS

## 2015-11-02 MED ORDER — CEFAZOLIN SODIUM-DEXTROSE 2-4 GM/100ML-% IV SOLN
2.0000 g | INTRAVENOUS | Status: AC
  Administered 2015-11-02: 2 g via INTRAVENOUS
  Filled 2015-11-02: qty 100

## 2015-11-02 SURGICAL SUPPLY — 33 items
BANDAGE ELASTIC 4 LF NS (GAUZE/BANDAGES/DRESSINGS) ×2 IMPLANT
BNDG ESMARK 4X12 TAN STRL LF (GAUZE/BANDAGES/DRESSINGS) ×2 IMPLANT
BNDG GAUZE 4.5X4.1 6PLY STRL (MISCELLANEOUS) ×2 IMPLANT
CANISTER SUCT 1200ML W/VALVE (MISCELLANEOUS) ×2 IMPLANT
DRAPE FLUOR MINI C-ARM 54X84 (DRAPES) ×2 IMPLANT
DRAPE XRAY CASSETTE 23X24 (DRAPES) ×2 IMPLANT
DURAPREP 26ML APPLICATOR (WOUND CARE) ×2 IMPLANT
ELECT REM PT RETURN 9FT ADLT (ELECTROSURGICAL) ×2
ELECTRODE REM PT RTRN 9FT ADLT (ELECTROSURGICAL) ×1 IMPLANT
GAUZE IODOFORM PACK 1/2 7832 (GAUZE/BANDAGES/DRESSINGS) ×2 IMPLANT
GAUZE PETRO XEROFOAM 1X8 (MISCELLANEOUS) ×2 IMPLANT
GAUZE SPONGE 4X4 12PLY STRL (GAUZE/BANDAGES/DRESSINGS) ×2 IMPLANT
GLOVE BIO SURGEON STRL SZ7.5 (GLOVE) ×2 IMPLANT
GLOVE INDICATOR 8.0 STRL GRN (GLOVE) ×2 IMPLANT
GOWN STRL REUS W/ TWL LRG LVL3 (GOWN DISPOSABLE) ×2 IMPLANT
GOWN STRL REUS W/TWL LRG LVL3 (GOWN DISPOSABLE) ×2
KIT RM TURNOVER STRD PROC AR (KITS) ×2 IMPLANT
LABEL OR SOLS (LABEL) ×2 IMPLANT
NEEDLE FILTER BLUNT 18X 1/2SAF (NEEDLE) ×1
NEEDLE FILTER BLUNT 18X1 1/2 (NEEDLE) ×1 IMPLANT
NEEDLE HYPO 25X1 1.5 SAFETY (NEEDLE) ×2 IMPLANT
NS IRRIG 500ML POUR BTL (IV SOLUTION) ×2 IMPLANT
PACK EXTREMITY ARMC (MISCELLANEOUS) ×2 IMPLANT
PAD ABD DERMACEA PRESS 5X9 (GAUZE/BANDAGES/DRESSINGS) ×4 IMPLANT
SOL .9 NS 3000ML IRR  AL (IV SOLUTION) ×1
SOL .9 NS 3000ML IRR UROMATIC (IV SOLUTION) ×1 IMPLANT
STOCKINETTE M/LG 89821 (MISCELLANEOUS) ×2 IMPLANT
STRAP SAFETY BODY (MISCELLANEOUS) ×2 IMPLANT
SUT ETHILON 3-0 FS-10 30 BLK (SUTURE) ×2
SUT ETHILON 5-0 FS-2 18 BLK (SUTURE) ×2 IMPLANT
SUT VIC AB 4-0 FS2 27 (SUTURE) ×2 IMPLANT
SUTURE EHLN 3-0 FS-10 30 BLK (SUTURE) ×1 IMPLANT
SYRINGE 10CC LL (SYRINGE) ×6 IMPLANT

## 2015-11-02 NOTE — Progress Notes (Signed)
Down to surgery via bed. Antibiotic sent with pt 

## 2015-11-02 NOTE — Progress Notes (Addendum)
Per MD patient will likely be stable for D/C tomorrow 11/03/15. Plan is for patient to D/C to [facility]. Per [facility] administrator patient will go to room 3-B fax:[phone]. Clinical Social Worker (CSW) contacted APS worker Randi and left her a voicemail making her aware of above. CSW sent D/C orders to [facility] today via HUB. CSW sent D/C Summary to patient's [facility] [address] CSW will continue to follow and assist as needed.   Baker Hughes IncorporatedBailey Angie Piercey, LCSW [phone]

## 2015-11-02 NOTE — Progress Notes (Signed)
Was unable to contact Tracy Drake pt's sister times multiple times calling. Pt AAOX3 verbalizes understanding of surgery procedure and agrees to have surgery done. Charge Nurse and pt's Nurse witness consent signed by Pt.\[id number]\

## 2015-11-02 NOTE — Op Note (Signed)
Operative note   Surgeon:Tysean Vand[place]li[place] [employer] logistics/support/administrative officerowler    Assistant: None    Preop diagnosis: Osteomyelitis distal right great toe    Postop diagnosis: Same    Procedure: Amputation at interphalangeal joint right great toe    EBL: Minimal    Anesthesia:local and IV sedation    Hemostasis: None    Specimen: Amputated right great toe    Complications: None    Operative indications:Tracy Drake is an 70 y.o. that presents today for surgical intervention.  The risks/benefits/alternatives/complications have been discussed and consent has been given.    Procedure:  Patient was brought into the OR and placed on the operating table in thesupine position. After anesthesia was obtained theright lower extremity was prepped and draped in usual sterile fashion.  Attention was directed to the distal right great toe where open ulceration and drainage was noted on the distal phalanx region. A fishmouth incision was created at the interphalangeal joint. Full-thickness incision was taken down to bone. The toe was disarticulated at the interphalangeal joint. The wound was flushed with copious amounts of irrigation. Mild bleeding was noted throughout the procedure. Closure was performed with a 3-0 nylon suture. A bulky sterile bandage was applied.    Patient tolerated the procedure and anesthesia well.  Was transported from the OR to the PACU with all vital signs stable and vascular status intact. To be discharged per routine protocol.  Will follow up in approximately 1 week in the outpatient clinic.

## 2015-11-02 NOTE — Care Management (Signed)
Spoke with Dr. Ether GriffinsFowler and he will put dressing order/note in for SNF to change dressings three times per week. CSW updated. Dr. Imogene Burnhen updated. No VAC or IV antibiotics are expected per Dr. Ether GriffinsFowler. SNF planned for tomorrow if medically cleared.

## 2015-11-02 NOTE — Discharge Summary (Signed)
[facility] [place] at [facility]   PATIENT NAME: Tracy CarrowLouvenia Haefele    MR#:  [id number]  DATE OF BIRTH:  03/13/1946  DATE OF ADMISSION:  10/22/2015 ADMITTING PHYSICIAN: Katharina Caperima Vaickute, MD  DATE OF DISCHARGE: 11/03/2015 PRIMARY CARE PHYSICIAN: Pcp Not In System    ADMISSION DIAGNOSIS:  Acute hyperkalemia [E87.5] Osteomyelitis (HCC) [M86.9] Toe infection [L08.9] Acute osteomyelitis of toe, right (HCC) [M86.171]  DISCHARGE DIAGNOSIS:  Principal Problem:   Toe osteomyelitis, right (HCC) Active Problems:   ESRD (end stage renal disease) (HCC)   Hyperkalemia   Anemia of chronic disease   Osteomyelitis (HCC)   Pressure ulcer   DNR (do not resuscitate) discussion   Palliative care encounter  PVD SECONDARY DIAGNOSIS:   Past Medical History  Diagnosis Date  . Diabetes mellitus (HCC)     a. not on medications  . History of stroke   . ESRD (end stage renal disease) on dialysis (HCC)   . Essential hypertension   . HLD (hyperlipidemia)   . Diabetic neuropathy (HCC)     HOSPITAL COURSE:   1. Right first toe pain due to Suspected osteomyelitis or ischemia . Continue pain medications. She was on heparin subcutaneously. X-ray of the toe could not rule out an osteomyelitis. MRI report possible osteomyelitis. she was treated with Zosyn and vancomycin. Dr. Orland Jarredroxler suggests Levaquin orally based on the culture and also with the good bone penetrate ability to the Levaquin. She is treated with wet-to-dry saline dressings to the toe. Dr. Ether GriffinsFowler planed for distal right great toe amputation today  PAD with gangrene right foot.  The patient underwent angiography July 12, sluggish flow was noted, due to severe peripheral vascular disease, not an amenable to intervention due to patient moving intermittently on procedure table, unable to get great toe amputated due to severe peripheral vascular disease.  Per Dr. Evie LacksEsco, vascular surgeon, pt has limited options  for revascularization at this point. If patient has uncontrolled pain and clear evidence of osteomyelitis consideration of reattempt under general anesthesia with endovascular therapy although likelihood of success is poor otherwise would recommend AKA.  Angiogram today: SFA occlusion, diseased popliteal artery, occlusion of proximal AT artery and peroneal arteries. S/p PTA with stent placement by Dr. Wyn Quakerew yesterday.  2. Hyperkalemia on presentation secondary to missed hemodialysis sessions. The patient underwent hemodialysis , improved.  3. Elevated troponin. Patient not having chest pain or shortness of breath. Patient already on aspirin and metoprolol and statin. Cardiology consultation is appreciated, further evaluation of his pharmacologic nuclear stress test was recommended, possibly outpatient, echocardiogram revealed normal ejection fraction, LVH, grade 1 diastolic dysfunction.  4. End-stage renal disease. continue hemodialysis as per nephrology. 5. Anemia of chronic disease, stable 6. Hyperglycemia, BS is normal. Hemoglobin A1c is low patient is not a diabetic. 7 Essential hypertension, resumed lisinopril. continue lopressor.  DISCHARGE CONDITIONS:   If stable after surgery, discharge to SNF tomorrow.  CONSULTS OBTAINED:  Treatment Team:  Lamont DowdySarath Kolluru, MD Recardo EvangelistMatthew Troxler, DPM Annice NeedyJason S Dew, MD  DRUG ALLERGIES:  No Known Allergies  DISCHARGE MEDICATIONS:   Current Discharge Medication List    CONTINUE these medications which have NOT CHANGED   Details  amLODipine (NORVASC) 2.5 MG tablet Take 2.5 mg by mouth daily.    aspirin EC 81 MG tablet Take 81 mg by mouth daily.    atorvastatin (LIPITOR) 20 MG tablet Take 20 mg by mouth daily at 6 PM.    cinacalcet (SENSIPAR) 90 MG tablet Take 90  mg by mouth daily with supper.    lisinopril (PRINIVIL,ZESTRIL) 10 MG tablet Take 10 mg by mouth daily.    sevelamer carbonate (RENVELA) 800 MG tablet Take 1,600 mg by  mouth 3 (three) times daily with meals.    venlafaxine XR (EFFEXOR-XR) 37.5 MG 24 hr capsule Take 37.5 mg by mouth daily with breakfast.         DISCHARGE INSTRUCTIONS:    DIET:  Renal and ADA diet.  DISCHARGE CONDITION:    ACTIVITY:  As tolerated.  DISCHARGE LOCATION:   If you experience worsening of your admission symptoms, develop shortness of breath, life threatening emergency, suicidal or homicidal thoughts you must seek medical attention immediately by calling 911 or calling your MD immediately  if symptoms less severe.  You Must read complete instructions/literature along with all the possible adverse reactions/side effects for all the Medicines you take and that have been prescribed to you. Take any new Medicines after you have completely understood and accpet all the possible adverse reactions/side effects.   Please note  You were cared for by a hospitalist during your hospital stay. If you have any questions about your discharge medications or the care you received while you were in the hospital after you are discharged, you can call the unit and asked to speak with the hospitalist on call if the hospitalist that took care of you is not available. Once you are discharged, your primary care physician will handle any further medical issues. Please note that NO REFILLS for any discharge medications will be authorized once you are discharged, as it is imperative that you return to your primary care physician (or establish a relationship with a primary care physician if you do not have one) for your aftercare needs so that they can reassess your need for medications and monitor your lab values.    On the day of Discharge:  VITAL SIGNS:  Blood pressure 122/62, pulse 78, temperature 98.4 F (36.9 C), temperature source Oral, resp. rate 16, height 5\' 2"  (1.575 m), weight 138 lb 14.2 oz (63 kg), SpO2 100 %.  PHYSICAL EXAMINATION:  GENERAL:  70 y.o.-year-old patient lying in the  bed with no acute distress.  EYES: Pupils equal, round, reactive to light and accommodation. No scleral icterus. Extraocular muscles intact.  HEENT: Head atraumatic, normocephalic. Oropharynx and nasopharynx clear.  NECK:  Supple, no jugular venous distention. No thyroid enlargement, no tenderness.  LUNGS: Normal breath sounds bilaterally, no wheezing, rales,rhonchi or crepitation. No use of accessory muscles of respiration.  CARDIOVASCULAR: S1, S2 normal. No murmurs, rubs, or gallops.  ABDOMEN: Soft, non-tender, non-distended. Bowel sounds present. No organomegaly or mass.  EXTREMITIES: No pedal edema, cyanosis, or clubbing. Right big toe in dressing. NEUROLOGIC: Cranial nerves II through XII are intact. Muscle strength 4/5 in all extremities. Sensation intact. Gait not checked.  PSYCHIATRIC: The patient is alert and oriented x 3.  SKIN: No obvious rash, lesion, or ulcer.  DATA REVIEW:   CBC  Recent Labs Lab 10/27/15 0951  WBC 4.5  HGB 9.5*  HCT 27.3*  PLT 138*    Chemistries   Recent Labs Lab 10/27/15 0951  NA 137  K 4.1  CL 98*  CO2 31  GLUCOSE 115*  BUN 29*  CREATININE 6.12*  CALCIUM 8.6*    Cardiac Enzymes No results for input(s): TROPONINI in the last 168 hours.  Microbiology Results  Results for orders placed or performed during the hospital encounter of 10/22/15  Blood culture (routine x  2)     Status: None   Collection Time: 10/22/15  2:15 PM  Result Value Ref Range Status   Specimen Description BLOOD RIGHT ARM  Final   Special Requests BOTTLES DRAWN AEROBIC AND ANAEROBIC 6CC  Final   Culture NO GROWTH 5 DAYS  Final   Report Status 10/27/2015 FINAL  Final  Blood culture (routine x 2)     Status: None   Collection Time: 10/22/15  2:25 PM  Result Value Ref Range Status   Specimen Description BLOOD RIGHT ARM  Final   Special Requests BOTTLES DRAWN AEROBIC AND ANAEROBIC 6CC  Final   Culture NO GROWTH 5 DAYS  Final   Report Status 10/27/2015 FINAL  Final    MRSA PCR Screening     Status: None   Collection Time: 10/22/15 10:09 PM  Result Value Ref Range Status   MRSA by PCR NEGATIVE NEGATIVE Final    Comment:        The GeneXpert MRSA Assay (FDA approved for NASAL specimens only), is one component of a comprehensive MRSA colonization surveillance program. It is not intended to diagnose MRSA infection nor to guide or monitor treatment for MRSA infections.   Aerobic/Anaerobic Culture (surgical/deep wound)     Status: None   Collection Time: 10/26/15  3:42 PM  Result Value Ref Range Status   Specimen Description TOE RIGHT  Final   Special Requests NONE  Final   Gram Stain   Final    RARE WBC PRESENT, PREDOMINANTLY MONONUCLEAR ABUNDANT GRAM POSITIVE COCCI IN PAIRS MODERATE GRAM VARIABLE ROD    Culture   Final    FEW STENOTROPHOMONAS MALTOPHILIA NO ANAEROBES ISOLATED Performed at [facility]    Report Status 10/31/2015 FINAL  Final   Organism ID, Bacteria STENOTROPHOMONAS MALTOPHILIA  Final      Susceptibility   Stenotrophomonas maltophilia - MIC*    LEVOFLOXACIN 0.25 SENSITIVE Sensitive     TRIMETH/SULFA <=20 SENSITIVE Sensitive     * FEW STENOTROPHOMONAS MALTOPHILIA    RADIOLOGY:  No results found.   Management plans discussed with the patient, family and they are in agreement.  CODE STATUS:     Code Status Orders        Start     Ordered   10/30/15 1548  Do not attempt resuscitation (DNR)   Continuous    Question Answer Comment  In the event of cardiac or respiratory ARREST Do not call a "code blue"   In the event of cardiac or respiratory ARREST Do not perform Intubation, CPR, defibrillation or ACLS   In the event of cardiac or respiratory ARREST Use medication by any route, position, wound care, and other measures to relive pain and suffering. May use oxygen, suction and manual treatment of airway obstruction as needed for comfort.      10/30/15 1547    Code Status History    Date Active Date  Inactive Code Status Order ID Comments User Context   10/22/2015  8:48 PM 10/30/2015  3:47 PM Full Code [id number]  Katharina Caper, MD Inpatient      TOTAL TIME TAKING CARE OF THIS PATIENT: 33 minutes.    Shaune Pollack M.D on 11/02/2015 at 1:08 PM  Between 7am to 6pm - Pager - [phone]  After 6pm go to [email or web address] - password [email or web address]  [facility]  Office  [phone]  CC: Primary care physician; Pcp Not In System   Note: This dictation was prepared with Dragon dictation along with  smaller phrase technology. Any transcriptional errors that result from this process are unintentional.

## 2015-11-02 NOTE — Progress Notes (Signed)
Left groin old blood on dressing reinforced.

## 2015-11-02 NOTE — Progress Notes (Signed)
Patient ID: [name], female   DOB: 24-Dec-1945, 70 y.o.   MRN: [id number] [facility] PROGRESS NOTE  Benelli Winther WUJ:[id number] DOB: [birth year] DOA: 10/22/2015 PCP: Pcp Not In System  HPI/Subjective: Pt complained of right big toe pain. Objective: Filed Vitals:   11/02/15 0410 11/02/15 0713  BP: 108/47 122/62  Pulse: 79 78  Temp: 99.1 F (37.3 C) 98.4 F (36.9 C)  Resp: 16 16    Filed Weights   10/29/15 0334 10/30/15 1020 10/30/15 1406  Weight: 138 lb 14.4 oz (63.005 kg) 138 lb 14.4 oz (63.005 kg) 138 lb 14.2 oz (63 kg)    ROS: Review of Systems  Constitutional: Negative for fever, chills and weight loss.  HENT: Negative for congestion.   Eyes: Negative for blurred vision and double vision.  Respiratory: Negative for cough, sputum production, shortness of breath and wheezing.   Cardiovascular: Negative for chest pain, palpitations, orthopnea, leg swelling and PND.  Gastrointestinal: Negative for nausea, vomiting, abdominal pain, diarrhea, constipation and blood in stool.  Genitourinary: Negative for dysuria, urgency, frequency and hematuria.  Musculoskeletal: Negative for falls.  right great toe pain. Neurological: Negative for dizziness, tremors, focal weakness and headaches.  Endo/Heme/Allergies: Does not bruise/bleed easily.  Psychiatric/Behavioral: Negative for depression. The patient does not have insomnia.    Exam: Physical Exam  HENT:  Nose: No mucosal edema.  Mouth/Throat: No oropharyngeal exudate or posterior oropharyngeal edema.  Eyes: Conjunctivae, EOM and lids are normal. Pupils are equal, round, and reactive to light.  Neck: No JVD present. Carotid bruit is not present. No edema present. No thyroid mass and no thyromegaly present.  Cardiovascular: S1 normal and S2 normal.  Exam reveals no gallop.   Murmur heard.  Systolic murmur is present with a grade of 2/6  Pulses:      Dorsalis pedis pulses are not present.  Respiratory: No respiratory  distress. She has no wheezes. She has no rhonchi. She has no rales.  GI: Soft. Bowel sounds are normal. There is no tenderness.  Musculoskeletal:       Right ankle: She exhibits no swelling.       Left ankle: She exhibits no swelling.  Lymphadenopathy:    She has no cervical adenopathy.  Neurological: She is alert.  Skin: Skin is warm. no clubbing.  right great toe with dressing. Psychiatric: She has a normal mood and affect.  Patient is awaken and alert, answers questions appropriately    Data Reviewed: Basic Metabolic Panel:  Recent Labs Lab 10/27/15 0951  NA 137  K 4.1  CL 98*  CO2 31  GLUCOSE 115*  BUN 29*  CREATININE 6.12*  CALCIUM 8.6*  PHOS 5.7*   Liver Function Tests:  Recent Labs Lab 10/27/15 0951  ALBUMIN 2.6*   CBC:  Recent Labs Lab 10/27/15 0951  WBC 4.5  HGB 9.5*  HCT 27.3*  MCV 97.4  PLT 138*   Cardiac Enzymes: No results for input(s): CKTOTAL, CKMB, CKMBINDEX, TROPONINI in the last 168 hours.  CBG:  Recent Labs Lab 11/01/15 0747 11/01/15 1242 11/01/15 1356 11/01/15 2115 11/02/15 0746  GLUCAP 71 92 113* 93 109*    Recent Results (from the past 240 hour(s))  Aerobic/Anaerobic Culture (surgical/deep wound)     Status: None   Collection Time: 10/26/15  3:42 PM  Result Value Ref Range Status   Specimen Description TOE RIGHT  Final   Special Requests NONE  Final   Gram Stain   Final    RARE WBC PRESENT, PREDOMINANTLY  MONONUCLEAR ABUNDANT GRAM POSITIVE COCCI IN PAIRS MODERATE GRAM VARIABLE ROD    Culture   Final    FEW STENOTROPHOMONAS MALTOPHILIA NO ANAEROBES ISOLATED [facility]    Report Status 10/31/2015 FINAL  Final   Organism ID, Bacteria STENOTROPHOMONAS MALTOPHILIA  Final      Susceptibility   Stenotrophomonas maltophilia - MIC*    LEVOFLOXACIN 0.25 SENSITIVE Sensitive     TRIMETH/SULFA <=20 SENSITIVE Sensitive     * FEW STENOTROPHOMONAS MALTOPHILIA     Studies: No results  found.  Scheduled Meds: . aspirin EC  81 mg Oral Daily  . atorvastatin  20 mg Oral q1800  . calcium gluconate 1 GM IV  1 g Intravenous Once  .  ceFAZolin (ANCEF) IV  2 g Intravenous On Call to OR  . Chlorhexidine Gluconate Cloth  6 each Topical Once  . cinacalcet  90 mg Oral Q supper  . clopidogrel  75 mg Oral Daily  . docusate sodium  100 mg Oral BID  . epoetin (EPOGEN/PROCRIT) injection  4,000 Units Intravenous Q T,Th,Sa-HD  . heparin subcutaneous  5,000 Units Subcutaneous Q8H  . levofloxacin  500 mg Oral Q48H  . lisinopril  10 mg Oral Daily  . metoprolol tartrate  12.5 mg Oral BID  . mupirocin ointment   Topical BID  . mupirocin ointment   Nasal Daily  . senna  1 tablet Oral BID  . sevelamer carbonate  1,600 mg Oral TID WC  . sodium chloride flush  3 mL Intravenous Q12H  . sodium chloride flush  3 mL Intravenous Q12H  . venlafaxine XR  37.5 mg Oral Q breakfast    Assessment/Plan:  1. Right first toe pain due to Suspected osteomyelitis or ischemia .  Continue pain medications. She was on heparin subcutaneously.  X-ray of the toe could not rule out an osteomyelitis. MRI report possible osteomyelitis.  she was treated with  Zosyn and vancomycin. Dr. Orland Jarredroxler suggests Levaquin orally based on the culture and also with the good bone penetrate ability to the Levaquin.  She is treated with wet-to-dry saline dressings to the toe. Dr. Ether GriffinsFowler planed for distal right great toe amputation today  PAD with gangrene right foot.  The patient underwent angiography July 12, sluggish flow was noted, due to severe peripheral vascular disease, not an amenable to intervention due to patient moving intermittently on procedure table, unable to get great toe amputated due to severe peripheral vascular disease.  Per Dr. Evie LacksEsco, vascular surgeon, pt has limited options for revascularization at this point. If patient has uncontrolled pain and clear evidence of osteomyelitis consideration of reattempt under  general anesthesia with endovascular therapy although likelihood of success is poor otherwise would recommend AKA.  Angiogram today: SFA occlusion, diseased popliteal artery, occlusion of proximal AT artery and peroneal arteries. S/p PTA with stent placement by Dr. Wyn Quakerew yesterday.  2. Hyperkalemia on presentation secondary to missed hemodialysis sessions. The patient underwent hemodialysis ,  improved.  3. Elevated troponin.  Patient not having chest pain or shortness of breath. Patient already on aspirin and metoprolol and statin. Cardiology consultation is appreciated, further evaluation of his pharmacologic nuclear stress test was recommended, possibly outpatient, echocardiogram revealed normal ejection fraction, LVH, grade 1 diastolic dysfunction.  4. End-stage renal disease. continue hemodialysis as per nephrology. 5. Anemia of chronic disease, stable 6. Hyperglycemia, BS is normal. Hemoglobin A1c is low patient is not a diabetic. 7     Essential hypertension, resumed lisinopril. continue lopressor.  Code Status:  DNR. Family Communication: I discussed with her son. Disposition Plan: Plan is for patient to D/C to [facility]. Clinical Social Worker (CSW) will continue to follow and assist as needed.   Consultants:  Cardiology  Nephrology  Podiatry  Vascular surgery  Antibiotics:  Zosyn  Time spent: 25 minutes  Shaune Pollack  [employer] [facility]

## 2015-11-02 NOTE — Progress Notes (Signed)
Subjective:  Patient report pain in the right toe. Also report feeling cold in the left foot. She underwent dialysis yesterday and tolerated this well. 1500 cc of fluid was removed. Scheduled for toe amputation today.   Objective:  Vital signs in last 24 hours:  Temp:  [96.8 F (36 C)-99.1 F (37.3 C)] 99.1 F (37.3 C) (07/21 0410) Pulse Rate:  [46-79] 79 (07/21 0410) Resp:  [10-27] 16 (07/21 0410) BP: (98-162)/(46-79) 108/47 mmHg (07/21 0410) SpO2:  [98 %-100 %] 100 % (07/21 0410)  Weight change:  Filed Weights   10/29/15 0334 10/30/15 1020 10/30/15 1406  Weight: 63.005 kg (138 lb 14.4 oz) 63.005 kg (138 lb 14.4 oz) 63 kg (138 lb 14.2 oz)    Intake/Output:    Intake/Output Summary (Last 24 hours) at 11/02/15 0951 Last data filed at 11/01/15 1754  Gross per 24 hour  Intake   1100 ml  Output   1525 ml  Net   -425 ml     Physical Exam: General: NAD, laying in bed  HEENT anicteric  Neck supple  Pulm/lungs Clear, normal effort  CVS/Heart No rub or gallop  Abdomen:  Soft, NT  Extremities: No edema  Neurologic: Alert and asking several questions  Skin: No acute issues  Access: Rt IJ PC, left arm AV access       Basic Metabolic Panel:   Recent Labs Lab 10/27/15 0951  NA 137  K 4.1  CL 98*  CO2 31  GLUCOSE 115*  BUN 29*  CREATININE 6.12*  CALCIUM 8.6*  PHOS 5.7*     CBC:  Recent Labs Lab 10/27/15 0951  WBC 4.5  HGB 9.5*  HCT 27.3*  MCV 97.4  PLT 138*      Microbiology:  Recent Results (from the past 720 hour(s))  Blood culture (routine x 2)     Status: None   Collection Time: 10/22/15  2:15 PM  Result Value Ref Range Status   Specimen Description BLOOD RIGHT ARM  Final   Special Requests BOTTLES DRAWN AEROBIC AND ANAEROBIC 6CC  Final   Culture NO GROWTH 5 DAYS  Final   Report Status 10/27/2015 FINAL  Final  Blood culture (routine x 2)     Status: None   Collection Time: 10/22/15  2:25 PM  Result Value Ref Range Status   Specimen  Description BLOOD RIGHT ARM  Final   Special Requests BOTTLES DRAWN AEROBIC AND ANAEROBIC 6CC  Final   Culture NO GROWTH 5 DAYS  Final   Report Status 10/27/2015 FINAL  Final  MRSA PCR Screening     Status: None   Collection Time: 10/22/15 10:09 PM  Result Value Ref Range Status   MRSA by PCR NEGATIVE NEGATIVE Final    Comment:        The GeneXpert MRSA Assay (FDA approved for NASAL specimens only), is one component of a comprehensive MRSA colonization surveillance program. It is not intended to diagnose MRSA infection nor to guide or monitor treatment for MRSA infections.   Aerobic/Anaerobic Culture (surgical/deep wound)     Status: None   Collection Time: 10/26/15  3:42 PM  Result Value Ref Range Status   Specimen Description TOE RIGHT  Final   Special Requests NONE  Final   Gram Stain   Final    RARE WBC PRESENT, PREDOMINANTLY MONONUCLEAR ABUNDANT GRAM POSITIVE COCCI IN PAIRS MODERATE GRAM VARIABLE ROD    Culture   Final    FEW STENOTROPHOMONAS MALTOPHILIA NO ANAEROBES ISOLATED [facility]    Report Status 10/31/2015 FINAL  Final   Organism ID, Bacteria STENOTROPHOMONAS MALTOPHILIA  Final      Susceptibility   Stenotrophomonas maltophilia - MIC*    LEVOFLOXACIN 0.25 SENSITIVE Sensitive     TRIMETH/SULFA <=20 SENSITIVE Sensitive     * FEW STENOTROPHOMONAS MALTOPHILIA    Coagulation Studies: No results for input(s): LABPROT, INR in the last 72 hours.  Urinalysis: No results for input(s): COLORURINE, LABSPEC, PHURINE, GLUCOSEU, HGBUR, BILIRUBINUR, KETONESUR, PROTEINUR, UROBILINOGEN, NITRITE, LEUKOCYTESUR in the last 72 hours.  Invalid input(s): APPERANCEUR    Imaging: No results found.   Medications:     . aspirin EC  81 mg Oral Daily  . atorvastatin  20 mg Oral q1800  . calcium gluconate 1 GM IV  1 g Intravenous Once  .  ceFAZolin (ANCEF) IV  2 g Intravenous On Call to OR  . Chlorhexidine Gluconate Cloth  6 each Topical Once  .  cinacalcet  90 mg Oral Q supper  . clopidogrel  75 mg Oral Daily  . docusate sodium  100 mg Oral BID  . epoetin (EPOGEN/PROCRIT) injection  4,000 Units Intravenous Q T,Th,Sa-HD  . heparin subcutaneous  5,000 Units Subcutaneous Q8H  . levofloxacin  500 mg Oral Q48H  . lisinopril  10 mg Oral Daily  . metoprolol tartrate  12.5 mg Oral BID  . mupirocin ointment   Topical BID  . mupirocin ointment   Nasal Daily  . senna  1 tablet Oral BID  . sevelamer carbonate  1,600 mg Oral TID WC  . sodium chloride flush  3 mL Intravenous Q12H  . sodium chloride flush  3 mL Intravenous Q12H  . venlafaxine XR  37.5 mg Oral Q breakfast   sodium chloride, acetaminophen **OR** acetaminophen, HYDROcodone-acetaminophen, morphine injection, ondansetron **OR** ondansetron (ZOFRAN) IV, polyethylene glycol, sodium chloride flush  Assessment/ Plan:  70 y.o.African American female with hypertension, diabetes mellitus type II, hyperlipidemia, thyroid disorder and ESRD on hemodialysis. [place] recently moved from [place] via [place]. Now staying with her sister.   [facility] [address] R[address]  1. End Stage Renal Disease with hyperkalemia on admission: Continue TTS schedule.  - Dialysis scheduled for tomorrow  2.  Anemia of chronic kidney disease:  -  Low dose EPO with dialysis  3. Secondary Hyperparathyroidism: outpatient PTH >1360. Phos 5.7  - continue sevelamer for phos binding - cinacalcet  4. Peripheral Vascular disease: Osteomyelitis with pain in right toe.  - Toe amputation planned for today.   LOS: 11 Mickel Schreur 7/21/20179:50 AM

## 2015-11-02 NOTE — Care Management Important Message (Signed)
Important Message  Patient Details  Name: Tracy CarrowLouvenia Drake MRN: [id number] Date of Birth: 11/15/1945   Medicare Important Message Given:  Yes    Collie Siadngela Khadar Monger, RN 11/02/2015, 8:51 AM

## 2015-11-02 NOTE — Progress Notes (Signed)
Dr Priscella MannPenwarden aware of Glu 63. No further orders at this time.

## 2015-11-02 NOTE — Transfer of Care (Signed)
Immediate Anesthesia Transfer of Care Note  Patient: Verne CarrowLouvenia Bardales  Procedure(s) Performed: Procedure(s): AMPUTATION TOE (Right)  Patient Location: PACU  Anesthesia Type:General  Level of Consciousness: sedated  Airway & Oxygen Therapy: Patient Spontanous Breathing and Patient connected to face mask oxygen  Post-op Assessment: Report given to RN and Post -op Vital signs reviewed and stable  Post vital signs: Reviewed and stable  Last Vitals:  Filed Vitals:   11/02/15 0713 11/02/15 1349  BP: 122/62 153/68  Pulse: 78 75  Temp: 36.9 C 36.2 C  Resp: 16 18    Last Pain:  Filed Vitals:   11/02/15 1351  PainSc: 10-Worst pain ever      Patients Stated Pain Goal: 0 (10/22/15 1815)  Complications: No apparent anesthesia complications

## 2015-11-02 NOTE — Anesthesia Preprocedure Evaluation (Signed)
Anesthesia Evaluation  Patient identified by MRN, date of birth, ID band Patient awake    Reviewed: Allergy & Precautions, NPO status , Patient's Chart, lab work & pertinent test results  History of Anesthesia Complications Negative for: history of anesthetic complications  Airway Mallampati: III  TM Distance: >3 FB Neck ROM: Full    Dental  (+) Edentulous Lower, Edentulous Upper   Pulmonary neg COPD,    breath sounds clear to auscultation- rhonchi (-) wheezing      Cardiovascular Exercise Tolerance: Poor hypertension, + Peripheral Vascular Disease  (-) CAD and (-) Past MI  Rhythm:Regular Rate:Normal - Systolic murmurs and - Diastolic murmurs    Neuro/Psych CVA, No Residual Symptoms negative psych ROS   GI/Hepatic negative GI ROS, Neg liver ROS,   Endo/Other  diabetes  Renal/GU ESRF and DialysisRenal disease     Musculoskeletal negative musculoskeletal ROS (+)   Abdominal Normal abdominal exam  (+)   Peds  Hematology  (+) anemia ,   Anesthesia Other Findings Past Medical History:   Diabetes mellitus (HCC)                                        Comment:a. not on medications   History of stroke                                            ESRD (end stage renal disease) on dialysis (HC*              Essential hypertension                                       HLD (hyperlipidemia)                                         Diabetic neuropathy (HCC)                                    Reproductive/Obstetrics negative OB ROS                             Anesthesia Physical  Anesthesia Plan  ASA: IV  Anesthesia Plan: General   Post-op Pain Management:    Induction: Intravenous  Airway Management Planned: Natural Airway  Additional Equipment:   Intra-op Plan:   Post-operative Plan:   Informed Consent: I have reviewed the patients History and Physical, chart, labs and discussed the  procedure including the risks, benefits and alternatives for the proposed anesthesia with the patient or authorized representative who has indicated his/her understanding and acceptance.     Plan Discussed with:   Anesthesia Plan Comments: (Plan for deep sedation with surgeon performing block, will convert to GA if necessary. Of note, patient has DNR order on her chart, but when I discussed this with both her and her sister neither of them understood what I was talking about. I discussed that in the setting of this procedure and receiving anesthesia we would perform any necessary recusitation  measures. )        Anesthesia Quick Evaluation

## 2015-11-02 NOTE — Consult Note (Signed)
ORTHOPAEDIC CONSULTATION  REQUESTING PHYSICIAN: Shaune PollackQing Chen, MD  Chief Complaint: Right great toe pain and infection  HPI: Tracy Drake is a 70 y.o. female who complains of  Pain right great toe.  With severe PVD and underwent revascularization by vasc surgery yesterday.  Has osteomyelitis on MRI.    Past Medical History  Diagnosis Date  . Diabetes mellitus (HCC)     a. not on medications  . History of stroke   . ESRD (end stage renal disease) on dialysis (HCC)   . Essential hypertension   . HLD (hyperlipidemia)   . Diabetic neuropathy [facility](HCC)    Past Surgical History  Procedure Laterality Date  . Peripheral vascular catheterization Right 10/24/2015    Procedure: Lower Extremity Angiography;  Surgeon: Annice NeedyJason S Dew, MD;  Location: [facility] INVASIVE CV LAB;  Service: Cardiovascular;  Laterality: Right;  . Peripheral vascular catheterization Right 11/01/2015    Procedure: Lower Extremity Angiography;  Surgeon: Annice NeedyJason S Dew, MD;  Location: [facility] INVASIVE CV LAB;  Service: Cardiovascular;  Laterality: Right;  . Peripheral vascular catheterization  11/01/2015    Procedure: Lower Extremity Intervention;  Surgeon: Annice NeedyJason S Dew, MD;  Location: [facility] INVASIVE CV LAB;  Service: Cardiovascular;;   Social History   Social History  . Marital Status: Single    Spouse Name: N/A  . Number of Children: N/A  . Years of Education: N/A   Social History Main Topics  . Smoking status: Never Smoker   . Smokeless tobacco: Never Used  . Alcohol Use: No  . Drug Use: None  . Sexual Activity: Not Asked   Other Topics Concern  . None   Social History Narrative   Family History  Problem Relation Age of Onset  . Hypertension Mother    No Known Allergies Prior to Admission medications   Medication Sig Start Date End Date Taking? Authorizing Provider  amLODipine (NORVASC) 2.5 MG tablet Take 2.5 mg by mouth daily.   Yes Historical Provider, MD  aspirin EC 81 MG tablet Take 81 mg by mouth daily.   Yes  Historical Provider, MD  atorvastatin (LIPITOR) 20 MG tablet Take 20 mg by mouth daily at 6 PM.   Yes Historical Provider, MD  cinacalcet (SENSIPAR) 90 MG tablet Take 90 mg by mouth daily with supper.   Yes Historical Provider, MD  lisinopril (PRINIVIL,ZESTRIL) 10 MG tablet Take 10 mg by mouth daily.   Yes Historical Provider, MD  sevelamer carbonate (RENVELA) 800 MG tablet Take 1,600 mg by mouth 3 (three) times daily with meals.   Yes Historical Provider, MD  venlafaxine XR (EFFEXOR-XR) 37.5 MG 24 hr capsule Take 37.5 mg by mouth daily with breakfast.   Yes Historical Provider, MD   IMPRESSION: 1. Vague lucencies within the tuft of the distal phalanx of the great toe. Based on the lateral projection, this is suspected to be due to overlying soft tissue edema. Demineralization and/or early destructive changes of osteomyelitis cannot be excluded. If clinical concern for osteomyelitis, recommend MRI for more definitive characterization  MRI results: IMPRESSION: Findings concerning for osteomyelitis of the first distal phalanx of the right foot. No drainable fluid collection to suggest an abscess.    Positive ROS: All other systems have been reviewed and were otherwise negative with the exception of those mentioned in the HPI and as above.  12 point ROS was performed.  Physical Exam: General: Alert and oriented.  No apparent distress.  Vascular:  Left foot:Dorsalis Pedis:  absent Posterior Tibial:  absent  Right foot: Dorsalis Pedis:  absent Posterior Tibial:  absent  Neuro:intact sensation grossly to right geat toe with palpation  Derm:open draining wound to distal lateral grea to nail with foul odor.  Ortho/MS: Mild edema to right foot.  Left foot without issue   Assessment: Osteomyelitis right great toe distal phalanx Arteriosclerosis lower ext  Plan: Will plan for distal right great toe amputation today. Pt had OJ and crackers before 7 am.  NPO for now. D/W pt r/b/a/c and  pt expressed understanding and verbal consent.    Irean Hong, DPM Cell [phone]   11/02/2015 8:00 AM

## 2015-11-02 NOTE — Progress Notes (Signed)
Dr stated this Am to hold all meds before surgery.

## 2015-11-02 NOTE — Progress Notes (Signed)
Pt underwent distal right great amputation  Tolerated procedure well.  Recommend heel wb for transfer in post op shoe.  Recommend changing dressing 3 times per week with dry bulky dressing.  F/U with podiatry in 2 weeks.  OK for d/c tomorrow from podiatry standpoint.

## 2015-11-02 NOTE — Progress Notes (Signed)
Dr. Wyn Quakerew notified of PAD on pt's left groin, from yesterdays porcedure.  Order received to remove now.

## 2015-11-02 NOTE — Pre-Procedure Instructions (Signed)
PAD on Lt groin.  Called Dr Wyn Quakerew.  Instructed to remove the device.

## 2015-11-02 NOTE — Discharge Instructions (Signed)
Dressing orders:  Change right foot dressing with large bulky sterile dressing 3 times a week.

## 2015-11-02 NOTE — Anesthesia Procedure Notes (Signed)
Date/Time: 11/02/2015 2:32 PM Performed by: Junious SilkNOLES, Rohn Fritsch Pre-anesthesia Checklist: Patient identified, Emergency Drugs available, Suction available, Patient being monitored and Timeout performed Oxygen Delivery Method: Simple face mask

## 2015-11-03 LAB — RENAL FUNCTION PANEL
Albumin: 2.2 g/dL — ABNORMAL LOW (ref 3.5–5.0)
Anion gap: 9 (ref 5–15)
BUN: 25 mg/dL — AB (ref 6–20)
CHLORIDE: 98 mmol/L — AB (ref 101–111)
CO2: 30 mmol/L (ref 22–32)
CREATININE: 4.73 mg/dL — AB (ref 0.44–1.00)
Calcium: 7.6 mg/dL — ABNORMAL LOW (ref 8.9–10.3)
GFR calc Af Amer: 10 mL/min — ABNORMAL LOW (ref >60)
GFR calc non Af Amer: 8 mL/min — ABNORMAL LOW (ref >60)
GLUCOSE: 118 mg/dL — AB (ref 65–99)
POTASSIUM: 4.2 mmol/L (ref 3.5–5.1)
Phosphorus: 3.6 mg/dL (ref 2.5–4.6)
Sodium: 137 mmol/L (ref 135–145)

## 2015-11-03 LAB — CBC
HEMATOCRIT: 20.4 % — AB (ref 35.0–47.0)
Hemoglobin: 6.8 g/dL — ABNORMAL LOW (ref 12.0–16.0)
MCH: 32.3 pg (ref 26.0–34.0)
MCHC: 33.4 g/dL (ref 32.0–36.0)
MCV: 96.7 fL (ref 80.0–100.0)
Platelets: 125 10*3/uL — ABNORMAL LOW (ref 150–440)
RBC: 2.11 MIL/uL — ABNORMAL LOW (ref 3.80–5.20)
RDW: 13.1 % (ref 11.5–14.5)
WBC: 3.8 10*3/uL (ref 3.6–11.0)

## 2015-11-03 LAB — ABO/RH: ABO/RH(D): O POS

## 2015-11-03 LAB — GLUCOSE, CAPILLARY
GLUCOSE-CAPILLARY: 111 mg/dL — AB (ref 65–99)
Glucose-Capillary: 96 mg/dL (ref 65–99)

## 2015-11-03 LAB — HEMOGLOBIN AND HEMATOCRIT, BLOOD
HCT: 20.3 % — ABNORMAL LOW (ref 35.0–47.0)
Hemoglobin: 7.1 g/dL — ABNORMAL LOW (ref 12.0–16.0)

## 2015-11-03 LAB — PREPARE RBC (CROSSMATCH)

## 2015-11-03 MED ORDER — DOCUSATE SODIUM 100 MG PO CAPS: 100.0000 mg | ORAL_CAPSULE | Freq: Two times a day (BID) | ORAL | Status: AC

## 2015-11-03 MED ORDER — NEPRO/CARBSTEADY PO LIQD: 237.0000 mL | Freq: Two times a day (BID) | ORAL | Status: DC

## 2015-11-03 MED ORDER — POLYETHYLENE GLYCOL 3350 17 G PO PACK: 17.0000 g | PACK | Freq: Every day | ORAL | Status: AC | PRN

## 2015-11-03 MED ORDER — SENNA 8.6 MG PO TABS: 1.0000 | ORAL_TABLET | Freq: Two times a day (BID) | ORAL | Status: AC

## 2015-11-03 MED ORDER — MUPIROCIN 2 % EX OINT: TOPICAL_OINTMENT | Freq: Two times a day (BID) | CUTANEOUS | Status: DC

## 2015-11-03 MED ORDER — CLOPIDOGREL BISULFATE 75 MG PO TABS: 75.0000 mg | ORAL_TABLET | Freq: Every day | ORAL | Status: AC

## 2015-11-03 MED ORDER — METOPROLOL TARTRATE 25 MG PO TABS: 12.5000 mg | ORAL_TABLET | Freq: Two times a day (BID) | ORAL | Status: DC

## 2015-11-03 MED ORDER — LEVOFLOXACIN 500 MG PO TABS: 500.0000 mg | ORAL_TABLET | ORAL | Status: DC

## 2015-11-03 MED ORDER — SODIUM CHLORIDE 0.9 % IV SOLN: Freq: Once | INTRAVENOUS | Status: DC

## 2015-11-03 MED ORDER — HYDROCODONE-ACETAMINOPHEN 5-325 MG PO TABS: 1.0000 | ORAL_TABLET | ORAL | Status: DC | PRN

## 2015-11-03 MED ORDER — MUPIROCIN 2 % EX OINT: TOPICAL_OINTMENT | Freq: Every day | CUTANEOUS | Status: AC

## 2015-11-03 NOTE — Progress Notes (Signed)
[facility]  Daily Progress Note   Subjective  - 1 Day Post-Op  Foot hurting after surgery yesterday. Access not working well. Nephrology has requested a shuntogram for further evaluation  Objective Filed Vitals:   11/02/15 2159 11/03/15 0349 11/03/15 0735 11/03/15 1508  BP: 131/56 143/58 148/48 150/57  Pulse: 79 78 81 81  Temp: 99.2 F (37.3 C) 97.5 F (36.4 C) 98.1 F (36.7 C) 98.5 F (36.9 C)  TempSrc: Oral Oral Oral Oral  Resp: 18 20 18 18   Height:      Weight:      SpO2: 99% 100% 100% 99%    Intake/Output Summary (Last 24 hours) at 11/03/15 1512 Last data filed at 11/03/15 0900  Gross per 24 hour  Intake 1137.5 ml  Output      0 ml  Net 1137.5 ml    PULM  CTAB CV  RRR VASC  Left foot dressed. Foot is warm. Access in left arm is pulsatile in the distal upper arm but no thrill is present from the mid upper arm proximal.  Laboratory CBC    Component Value Date/Time   WBC 4.5 10/27/2015 0951   HGB 9.5* 10/27/2015 0951   HCT 27.3* 10/27/2015 0951   PLT 138* 10/27/2015 0951    BMET    Component Value Date/Time   NA 137 10/27/2015 0951   K 4.1 10/27/2015 0951   CL 98* 10/27/2015 0951   CO2 31 10/27/2015 0951   GLUCOSE 115* 10/27/2015 0951   BUN 29* 10/27/2015 0951   CREATININE 6.12* 10/27/2015 0951   CALCIUM 8.6* 10/27/2015 0951   GFRNONAA 6* 10/27/2015 0951   GFRAA 7* 10/27/2015 0951    Assessment/Planning: POD #2 s/p right lower extremity revascularization   Right foot doing well and podiatry has performed their surgery. Local wound care per podiatry. Follow-up in 3-4 weeks for ABIs.  As for her access, this is a dysfunction of her dialysis access and needs to be evaluated. We'll try to perform a fistulogram on Monday. We'll try to do this without full anesthesia as her involuntary movements were largely in the leg, but not sure how she'll tolerate this.  Rolland Steinert  11/03/2015, 3:12 PM

## 2015-11-03 NOTE — Progress Notes (Signed)
HD COMPLETED  

## 2015-11-03 NOTE — Progress Notes (Signed)
POST DIALYSIS ASSESSMENT 

## 2015-11-03 NOTE — Progress Notes (Signed)
Patient returned to room from dialysis, comfortable and mildly drowsy. 1 liter removed in Dialysis. Type/Screen done in Dialysis, patient to receive 2 units blood in Dialysis tomorrow.

## 2015-11-03 NOTE — Progress Notes (Signed)
PRE DIALYSIS ASSESSMENT 

## 2015-11-03 NOTE — Discharge Summary (Signed)
[facility] - [place] at [facility]   PATIENT NAME: Tracy Drake    MR#:  [id number]  DATE OF BIRTH:  22-Apr-1945  DATE OF ADMISSION:  10/22/2015 ADMITTING PHYSICIAN: Katharina Caper, MD  DATE OF DISCHARGE: 11/03/2015 PRIMARY CARE PHYSICIAN: Pcp Not In System    ADMISSION DIAGNOSIS:  Acute hyperkalemia [E87.5] Osteomyelitis (HCC) [M86.9] Toe infection [L08.9] Acute osteomyelitis of toe, right (HCC) [M86.171]  DISCHARGE DIAGNOSIS:  Principal Problem:   Toe osteomyelitis, right (HCC) Active Problems:   Osteomyelitis (HCC)   Hyperkalemia   ESRD (end stage renal disease) (HCC)   Anemia of chronic disease   Pressure ulcer   DNR (do not resuscitate) discussion   Palliative care encounter  PVD SECONDARY DIAGNOSIS:   Past Medical History  Diagnosis Date  . Diabetes mellitus (HCC)     a. not on medications  . History of stroke   . Essential hypertension   . HLD (hyperlipidemia)   . Diabetic neuropathy (HCC)   . ESRD (end stage renal disease) on dialysis (HCC)     on dialysis    HOSPITAL COURSE:   1. Right great toe osteomyelitis, s/p  Distal righ great toe amputation by dr Ether Griffins 7.21./17,tolerated procedure well. Recommend heel weight bearing for transfer in post op shoe. Recommend changing dressing 3 times per week with dry bulky dressing. F/U with podiatry in 2 weeks .Continue pain medications.  The patient was initially treated with Zosyn and vancomycin.Now on  Levaquin orally based on the culture results- Stenotrophomonas maltophilia . The patient is to continue antibiotics for 2 more weeks to complete course.   2. PAD with gangrene right foot. The patient underwent angiography July 12, sluggish flow was noted, due to severe peripheral vascular disease, not an amenable to intervention due to patient moving intermittently on procedure table. Angiogram 7.20.17 by DR Wyn Quaker: SFA occlusion, diseased popliteal artery, occlusion of proximal AT artery  and peroneal arteries. S/p PTA with stent placement by Dr. Wyn Quaker.  2. Hyperkalemia on presentation secondary to missed hemodialysis sessions. The patient underwent hemodialysis , resolved.  3. Elevated troponin. Patient not having chest pain or shortness of breath. Patient is to continue  aspirin and metoprolol and statin. Cardiology consultation was obtained, further evaluation with pharmacologic nuclear stress test was recommended, possibly as outpatient, echocardiogram revealed normal ejection fraction, LVH, grade 1 diastolic dysfunction.  4. End-stage renal disease. continue hemodialysis as per nephrology., per schedule, 2/4/6  5. Anemia of chronic disease, stable, Epogen as needed with dialysis  6. Hyperglycemia, BS is normal. Hemoglobin A1c is low patient is not a diabetic.  7 Essential hypertension, resumed lisinopril. continue lopressor.  DISCHARGE CONDITIONS:   stable  CONSULTS OBTAINED:  Treatment Team:  Lamont Dowdy, MD Recardo Evangelist, DPM Annice Needy, MD  DRUG ALLERGIES:  No Known Allergies  DISCHARGE MEDICATIONS:   Current Discharge Medication List    START taking these medications   Details  clopidogrel (PLAVIX) 75 MG tablet Take 1 tablet (75 mg total) by mouth daily. Qty: 30 tablet, Refills: 6    docusate sodium (COLACE) 100 MG capsule Take 1 capsule (100 mg total) by mouth 2 (two) times daily. Qty: 10 capsule, Refills: 0    HYDROcodone-acetaminophen (NORCO/VICODIN) 5-325 MG tablet Take 1-2 tablets by mouth every 4 (four) hours as needed for moderate pain. Qty: 30 tablet, Refills: 0    levofloxacin (LEVAQUIN) 500 MG tablet Take 1 tablet (500 mg total) by mouth every other day. Qty: 7 tablet, Refills: 0  metoprolol tartrate (LOPRESSOR) 25 MG tablet Take 0.5 tablets (12.5 mg total) by mouth 2 (two) times daily. Qty: 60 tablet, Refills: 6    !! mupirocin ointment (BACTROBAN) 2 % Apply topically 2 (two) times daily. Qty: 22 g, Refills: 0      !! mupirocin ointment (BACTROBAN) 2 % Place into the nose daily. Qty: 22 g, Refills: 0    Nutritional Supplements (FEEDING SUPPLEMENT, NEPRO CARB STEADY,) LIQD Take 237 mLs by mouth 2 (two) times daily between meals. Qty: 60 Can, Refills: 6    polyethylene glycol (MIRALAX / GLYCOLAX) packet Take 17 g by mouth daily as needed for mild constipation. Qty: 14 each, Refills: 0    senna (SENOKOT) 8.6 MG TABS tablet Take 1 tablet (8.6 mg total) by mouth 2 (two) times daily. Qty: 120 each, Refills: 0     !! - Potential duplicate medications found. Please discuss with provider.    CONTINUE these medications which have NOT CHANGED   Details  aspirin EC 81 MG tablet Take 81 mg by mouth daily.    atorvastatin (LIPITOR) 20 MG tablet Take 20 mg by mouth daily at 6 PM.    cinacalcet (SENSIPAR) 90 MG tablet Take 90 mg by mouth daily with supper.    lisinopril (PRINIVIL,ZESTRIL) 10 MG tablet Take 10 mg by mouth daily.    sevelamer carbonate (RENVELA) 800 MG tablet Take 1,600 mg by mouth 3 (three) times daily with meals.    venlafaxine XR (EFFEXOR-XR) 37.5 MG 24 hr capsule Take 37.5 mg by mouth daily with breakfast.      STOP taking these medications     amLODipine (NORVASC) 2.5 MG tablet          DISCHARGE INSTRUCTIONS:    DIET:  Renal and ADA diet.  DISCHARGE CONDITION:    ACTIVITY:  As tolerated.  DISCHARGE LOCATION:   If you experience worsening of your admission symptoms, develop shortness of breath, life threatening emergency, suicidal or homicidal thoughts you must seek medical attention immediately by calling 911 or calling your MD immediately  if symptoms less severe.  You Must read complete instructions/literature along with all the possible adverse reactions/side effects for all the Medicines you take and that have been prescribed to you. Take any new Medicines after you have completely understood and accpet all the possible adverse reactions/side effects.   Please  note  You were cared for by a hospitalist during your hospital stay. If you have any questions about your discharge medications or the care you received while you were in the hospital after you are discharged, you can call the unit and asked to speak with the hospitalist on call if the hospitalist that took care of you is not available. Once you are discharged, your primary care physician will handle any further medical issues. Please note that NO REFILLS for any discharge medications will be authorized once you are discharged, as it is imperative that you return to your primary care physician (or establish a relationship with a primary care physician if you do not have one) for your aftercare needs so that they can reassess your need for medications and monitor your lab values.    On the day of Discharge:  VITAL SIGNS:  Blood pressure 148/48, pulse 81, temperature 98.1 F (36.7 C), temperature source Oral, resp. rate 18, height 5\' 2"  (1.575 m), weight 62.596 kg (138 lb), SpO2 100 %.  PHYSICAL EXAMINATION:  GENERAL:  70 y.o.-year-old patient lying in the bed with no acute  distress.  EYES: Pupils equal, round, reactive to light and accommodation. No scleral icterus. Extraocular muscles intact.  HEENT: Head atraumatic, normocephalic. Oropharynx and nasopharynx clear.  NECK:  Supple, no jugular venous distention. No thyroid enlargement, no tenderness.  LUNGS: Normal breath sounds bilaterally, no wheezing, rales,rhonchi or crepitation. No use of accessory muscles of respiration.  CARDIOVASCULAR: S1, S2 normal. No murmurs, rubs, or gallops.  ABDOMEN: Soft, non-tender, non-distended. Bowel sounds present. No organomegaly or mass.  EXTREMITIES: No pedal edema, cyanosis, or clubbing. Right big toe in dressing. NEUROLOGIC: Cranial nerves II through XII are intact. Muscle strength 4/5 in all extremities. Sensation intact. Gait not checked.  PSYCHIATRIC: The patient is alert and oriented x 3.  SKIN: No  obvious rash, lesion, or ulcer.  DATA REVIEW:   CBC  Recent Labs Lab 10/27/15 0951  WBC 4.5  HGB 9.5*  HCT 27.3*  PLT 138*    Chemistries   Recent Labs Lab 10/27/15 0951  NA 137  K 4.1  CL 98*  CO2 31  GLUCOSE 115*  BUN 29*  CREATININE 6.12*  CALCIUM 8.6*    Cardiac Enzymes No results for input(s): TROPONINI in the last 168 hours.  Microbiology Results  Results for orders placed or performed during the hospital encounter of 10/22/15  Blood culture (routine x 2)     Status: None   Collection Time: 10/22/15  2:15 PM  Result Value Ref Range Status   Specimen Description BLOOD RIGHT ARM  Final   Special Requests BOTTLES DRAWN AEROBIC AND ANAEROBIC 6CC  Final   Culture NO GROWTH 5 DAYS  Final   Report Status 10/27/2015 FINAL  Final  Blood culture (routine x 2)     Status: None   Collection Time: 10/22/15  2:25 PM  Result Value Ref Range Status   Specimen Description BLOOD RIGHT ARM  Final   Special Requests BOTTLES DRAWN AEROBIC AND ANAEROBIC 6CC  Final   Culture NO GROWTH 5 DAYS  Final   Report Status 10/27/2015 FINAL  Final  MRSA PCR Screening     Status: None   Collection Time: 10/22/15 10:09 PM  Result Value Ref Range Status   MRSA by PCR NEGATIVE NEGATIVE Final    Comment:        The GeneXpert MRSA Assay (FDA approved for NASAL specimens only), is one component of a comprehensive MRSA colonization surveillance program. It is not intended to diagnose MRSA infection nor to guide or monitor treatment for MRSA infections.   Aerobic/Anaerobic Culture (surgical/deep wound)     Status: None   Collection Time: 10/26/15  3:42 PM  Result Value Ref Range Status   Specimen Description TOE RIGHT  Final   Special Requests NONE  Final   Gram Stain   Final    RARE WBC PRESENT, PREDOMINANTLY MONONUCLEAR ABUNDANT GRAM POSITIVE COCCI IN PAIRS MODERATE GRAM VARIABLE ROD    Culture   Final    FEW STENOTROPHOMONAS MALTOPHILIA NO ANAEROBES ISOLATED Performed at  [facility]    Report Status 10/31/2015 FINAL  Final   Organism ID, Bacteria STENOTROPHOMONAS MALTOPHILIA  Final      Susceptibility   Stenotrophomonas maltophilia - MIC*    LEVOFLOXACIN 0.25 SENSITIVE Sensitive     TRIMETH/SULFA <=20 SENSITIVE Sensitive     * FEW STENOTROPHOMONAS MALTOPHILIA  Aerobic/Anaerobic Culture (surgical/deep wound)     Status: None (Preliminary result)   Collection Time: 11/02/15  2:36 PM  Result Value Ref Range Status   Specimen Description TOE  RIGHT  Final   Special Requests NONE  Final   Gram Stain   Final    RARE WBC PRESENT,BOTH PMN AND MONONUCLEAR FEW GRAM POSITIVE RODS FEW GRAM POSITIVE COCCI Performed at [facility]    Culture PENDING  Incomplete   Report Status PENDING  Incomplete    RADIOLOGY:  No results found.   Management plans discussed with the patient, family and they are in agreement.  CODE STATUS:     Code Status Orders        Start     Ordered   10/30/15 1548  Do not attempt resuscitation (DNR)   Continuous    Question Answer Comment  In the event of cardiac or respiratory ARREST Do not call a "code blue"   In the event of cardiac or respiratory ARREST Do not perform Intubation, CPR, defibrillation or ACLS   In the event of cardiac or respiratory ARREST Use medication by any route, position, wound care, and other measures to relive pain and suffering. May use oxygen, suction and manual treatment of airway obstruction as needed for comfort.      10/30/15 1547    Code Status History    Date Active Date Inactive Code Status Order ID Comments User Context   10/22/2015  8:48 PM 10/30/2015  3:47 PM Full Code [id number]  Katharina Caper, MD Inpatient      TOTAL TIME TAKING CARE OF THIS PATIENT: 40 minutes.    Katharina Caper M.D on 11/03/2015 at 8:35 AM  Between 7am to 6pm - Pager - [phone]  After 6pm go to [email or web address] - password [email or web address]  [facility]  Office  [phone]  CC:  Primary care physician; Pcp Not In System   Note: This dictation was prepared with Dragon dictation along with smaller phrase technology. Any transcriptional errors that result from this process are unintentional.

## 2015-11-03 NOTE — Progress Notes (Signed)
HD STARTED  

## 2015-11-03 NOTE — Progress Notes (Signed)
Patient transferred to dialysis. Report given to [facility]. Pt will discharge to SNF after dialysis.

## 2015-11-03 NOTE — Clinical Social Work Placement (Signed)
   CLINICAL SOCIAL WORK PLACEMENT  NOTE  Date:  11/03/2015  Patient Details  Name: Tracy Drake MRN: [id number] Date of Birth: [birth year]  Clinical Social Work is seeking post-discharge placement for this patient at the Skilled  Nursing Facility level of care (*CSW will initial, date and re-position this form in  chart as items are completed):  Yes   Patient/family provided with [facility] Clinical Social Work Department's list of facilities offering this level of care within the geographic area requested by the patient (or if unable, by the patient's family).  Yes   Patient/family informed of their freedom to choose among providers that offer the needed level of care, that participate in Medicare, Medicaid or managed care program needed by the patient, have an available bed and are willing to accept the patient.  Yes   Patient/family informed of [facility]'s ownership interest in [facility] and [facility], as well as of the fact that they are under no obligation to receive care at these facilities.  PASRR submitted to EDS on 10/24/15     PASRR number received on 10/24/15     Existing PASRR number confirmed on       FL2 transmitted to all facilities in geographic area requested by pt/family on 10/24/15     FL2 transmitted to all facilities within larger geographic area on       Patient informed that his/her managed care company has contracts with or will negotiate with certain facilities, including the following:        Yes   Patient/family informed of bed offers received.  Patient chooses bed at  [facility] )     Physician recommends and patient chooses bed at      Patient to be transferred to [facility] on 11/03/15.  Patient to be transferred to facility by [facility]     Patient family notified on 11/03/15 of transfer.  Name of family member notified:  Pt's sister, [name]     PHYSICIAN       Additional Comment:     _______________________________________________ Dede Query, LCSW 11/03/2015, 11:11 AM

## 2015-11-03 NOTE — Progress Notes (Signed)
Pt's hemoglobin low, (6.8; 7.1) pre-dialysis. MD notified. New orders placed for 2 units PRBC's to be transfused during dialysis tomorrow AM. Spoke with RN in dialysis, tentatively scheduled for 0900 tomorrow. Orders placed for transfusion and type/screen. Will need to obtain consent form pt once she returns from dialysis. Pt originally scheduled for D/C today to [facility], notified RN there that D/C cancelled at this time.

## 2015-11-03 NOTE — Progress Notes (Signed)
Pt scheduled for discharge today. Scheduled for dialysis this afternoon, or sooner if possible. Spoke with Charlie in dialysis, will try to get patient worked in earlier if at all possible. Pt currently sleeping. BP meds held this am, plavix rescheduled for this afternoon post dialysis.

## 2015-11-03 NOTE — Clinical Social Work Note (Signed)
Pt is ready for discharge today and will go [facility] this afternoon. Discharge information was sent to facility and they are able to accept pt. Pt is aware and agreeable to discharge plan. CSW also notified pt's sister of discharge. CSW left a message for [facility] APS Worker, [employer]. RN will call report and EMS will provide transportation. CSW is signing off as no further needs identified.   Dede Query, MSW, LCSW  Clinical Social Worker  [phone]

## 2015-11-03 NOTE — Progress Notes (Signed)
Subjective:  Patient underwent toe amputation Still reports this pain in the toe but tolerable Scheduled for dialysis later today   Objective:  Vital signs in last 24 hours:  Temp:  [97.1 F (36.2 C)-99.2 F (37.3 C)] 98.1 F (36.7 C) (07/22 0735) Pulse Rate:  [74-92] 81 (07/22 0735) Resp:  [14-20] 18 (07/22 0735) BP: (95-153)/(34-73) 148/48 mmHg (07/22 0735) SpO2:  [99 %-100 %] 100 % (07/22 0735) Weight:  [62.596 kg (138 lb)] 62.596 kg (138 lb) (07/21 1349)  Weight change:  Filed Weights   10/30/15 1020 10/30/15 1406 11/02/15 1349  Weight: 63.005 kg (138 lb 14.4 oz) 63 kg (138 lb 14.2 oz) 62.596 kg (138 lb)    Intake/Output:    Intake/Output Summary (Last 24 hours) at 11/03/15 1009 Last data filed at 11/03/15 0400  Gross per 24 hour  Intake  957.5 ml  Output     10 ml  Net  947.5 ml     Physical Exam: General: NAD, laying in bed  HEENT anicteric  Neck supple  Pulm/lungs Clear, normal effort  CVS/Heart No rub or gallop  Abdomen:  Soft, NT  Extremities: No edema  Neurologic: Alert and asking several questions  Skin: No acute issues  Access: Rt IJ PC, left arm AV access       Basic Metabolic Panel:  No results for input(s): NA, K, CL, CO2, GLUCOSE, BUN, CREATININE, CALCIUM, MG, PHOS in the last 168 hours.   CBC: No results for input(s): WBC, NEUTROABS, HGB, HCT, MCV, PLT in the last 168 hours.    Microbiology:  Recent Results (from the past 720 hour(s))  Blood culture (routine x 2)     Status: None   Collection Time: 10/22/15  2:15 PM  Result Value Ref Range Status   Specimen Description BLOOD RIGHT ARM  Final   Special Requests BOTTLES DRAWN AEROBIC AND ANAEROBIC 6CC  Final   Culture NO GROWTH 5 DAYS  Final   Report Status 10/27/2015 FINAL  Final  Blood culture (routine x 2)     Status: None   Collection Time: 10/22/15  2:25 PM  Result Value Ref Range Status   Specimen Description BLOOD RIGHT ARM  Final   Special Requests BOTTLES DRAWN  AEROBIC AND ANAEROBIC 6CC  Final   Culture NO GROWTH 5 DAYS  Final   Report Status 10/27/2015 FINAL  Final  MRSA PCR Screening     Status: None   Collection Time: 10/22/15 10:09 PM  Result Value Ref Range Status   MRSA by PCR NEGATIVE NEGATIVE Final    Comment:        The GeneXpert MRSA Assay (FDA approved for NASAL specimens only), is one component of a comprehensive MRSA colonization surveillance program. It is not intended to diagnose MRSA infection nor to guide or monitor treatment for MRSA infections.   Aerobic/Anaerobic Culture (surgical/deep wound)     Status: None   Collection Time: 10/26/15  3:42 PM  Result Value Ref Range Status   Specimen Description TOE RIGHT  Final   Special Requests NONE  Final   Gram Stain   Final    RARE WBC PRESENT, PREDOMINANTLY MONONUCLEAR ABUNDANT GRAM POSITIVE COCCI IN PAIRS MODERATE GRAM VARIABLE ROD    Culture   Final    FEW STENOTROPHOMONAS MALTOPHILIA NO ANAEROBES ISOLATED Performed at [facility]    Report Status 10/31/2015 FINAL  Final   Organism ID, Bacteria STENOTROPHOMONAS MALTOPHILIA  Final      Susceptibility  Stenotrophomonas maltophilia - MIC*    LEVOFLOXACIN 0.25 SENSITIVE Sensitive     TRIMETH/SULFA <=20 SENSITIVE Sensitive     * FEW STENOTROPHOMONAS MALTOPHILIA  Aerobic/Anaerobic Culture (surgical/deep wound)     Status: None (Preliminary result)   Collection Time: 11/02/15  2:36 PM  Result Value Ref Range Status   Specimen Description TOE RIGHT  Final   Special Requests NONE  Final   Gram Stain   Final    RARE WBC PRESENT,BOTH PMN AND MONONUCLEAR FEW GRAM POSITIVE RODS FEW GRAM POSITIVE COCCI Performed at [facility]    Culture PENDING  Incomplete   Report Status PENDING  Incomplete    Coagulation Studies: No results for input(s): LABPROT, INR in the last 72 hours.  Urinalysis: No results for input(s): COLORURINE, LABSPEC, PHURINE, GLUCOSEU, HGBUR, BILIRUBINUR, KETONESUR, PROTEINUR,  UROBILINOGEN, NITRITE, LEUKOCYTESUR in the last 72 hours.  Invalid input(s): APPERANCEUR    Imaging: No results found.   Medications:   . sodium chloride 50 mL/hr at 11/02/15 1342   . aspirin EC  81 mg Oral Daily  . atorvastatin  20 mg Oral q1800  . calcium gluconate 1 GM IV  1 g Intravenous Once  . cinacalcet  90 mg Oral Q supper  . clopidogrel  75 mg Oral Daily  . docusate sodium  100 mg Oral BID  . epoetin (EPOGEN/PROCRIT) injection  4,000 Units Intravenous Q T,Th,Sa-HD  . feeding supplement (NEPRO CARB STEADY)  237 mL Oral BID BM  . heparin subcutaneous  5,000 Units Subcutaneous Q8H  . levofloxacin  500 mg Oral Q48H  . lisinopril  10 mg Oral Daily  . metoprolol tartrate  12.5 mg Oral BID  . mupirocin ointment   Topical BID  . mupirocin ointment   Nasal Daily  . senna  1 tablet Oral BID  . sevelamer carbonate  1,600 mg Oral TID WC  . sodium chloride flush  3 mL Intravenous Q12H  . sodium chloride flush  3 mL Intravenous Q12H  . venlafaxine XR  37.5 mg Oral Q breakfast   sodium chloride, acetaminophen **OR** acetaminophen, HYDROcodone-acetaminophen, morphine injection, ondansetron **OR** ondansetron (ZOFRAN) IV, polyethylene glycol, sodium chloride flush  Assessment/ Plan:  70 y.o.African American female with hypertension, diabetes mellitus type II, hyperlipidemia, thyroid disorder and ESRD on hemodialysis. Pt recently moved from [place] via [place]. Now staying with her sister.   [facility] TTS [address]. RIJ permcath  1. End Stage Renal Disease with hyperkalemia on admission: Continue TTS schedule.  - Dialysis scheduled for later today - will need angiogram to access as outpatient as patient is being discharged today  2.  Anemia of chronic kidney disease:  -  Low dose EPO with dialysis  3. Secondary Hyperparathyroidism: Phos 5.7  - continue sevelamer for phos binding - cinacalcet  4. Peripheral Vascular disease: Osteomyelitis with pain in right  toe.  - Toe amputation  - Levaquin QOD for 7 doses   LOS: 12 Jaree Trinka 7/22/201710:09 AM

## 2015-11-04 LAB — GLUCOSE, CAPILLARY
GLUCOSE-CAPILLARY: 83 mg/dL (ref 65–99)
Glucose-Capillary: 73 mg/dL (ref 65–99)

## 2015-11-04 LAB — HEMOGLOBIN AND HEMATOCRIT, BLOOD
HEMATOCRIT: 31 % — AB (ref 35.0–47.0)
HEMOGLOBIN: 10.5 g/dL — AB (ref 12.0–16.0)

## 2015-11-04 MED ORDER — SODIUM CHLORIDE 0.9 % IV SOLN
INTRAVENOUS | Status: DC
  Administered 2015-11-05 – 2015-11-06 (×2): via INTRAVENOUS

## 2015-11-04 MED ORDER — CHLORHEXIDINE GLUCONATE CLOTH 2 % EX PADS
6.0000 | MEDICATED_PAD | Freq: Once | CUTANEOUS | Status: AC
  Administered 2015-11-04: 6 via TOPICAL

## 2015-11-04 NOTE — Progress Notes (Signed)
Orders were placed yesterday for pending fistulagram procedure on Monday.

## 2015-11-04 NOTE — Progress Notes (Signed)
Subjective:  Patient underwent toe amputation Seen at dialysis   HEMODIALYSIS FLOWSHEET:  Blood Flow Rate (mL/min): 400 mL/min Arterial Pressure (mmHg): -100 mmHg Venous Pressure (mmHg): 100 mmHg Transmembrane Pressure (mmHg): 40 mmHg Ultrafiltration Rate (mL/min): 340 mL/min Dialysate Flow Rate (mL/min): 800 ml/min Conductivity: Machine : 14 Conductivity: Machine : 14 Dialysis Fluid Bolus: Normal Saline Bolus Amount (mL): 250 mL Dialysate Change: 2K Intra-Hemodialysis Comments: (RESTING WITH EYES CLOSED)  Scheduled to receive blood transfusion today   Objective:  Vital signs in last 24 hours:  Temp:  [98.3 F (36.8 C)-99.1 F (37.3 C)] 98.6 F (37 C) (07/23 1250) Pulse Rate:  [67-101] 67 (07/23 1330) Resp:  [0-19] 15 (07/23 1330) BP: (115-167)/(47-62) 132/56 (07/23 1330) SpO2:  [97 %-100 %] 99 % (07/23 0500) Weight:  [62.5 kg (137 lb 12.8 oz)-66.2 kg (145 lb 15.1 oz)] 65.4 kg (144 lb 2.9 oz) (07/23 1300)  Weight change: 3.604 kg (7 lb 15.1 oz) Filed Weights   11/03/15 1540 11/03/15 1936 11/04/15 1300  Weight: 66.2 kg (145 lb 15.1 oz) 62.5 kg (137 lb 12.8 oz) 65.4 kg (144 lb 2.9 oz)    Intake/Output:    Intake/Output Summary (Last 24 hours) at 11/04/15 1404 Last data filed at 11/04/15 0500  Gross per 24 hour  Intake             1200 ml  Output             1001 ml  Net              199 ml     Physical Exam: General: NAD, laying in bed  HEENT anicteric  Neck supple  Pulm/lungs Clear, normal effort  CVS/Heart No rub or gallop  Abdomen:  Soft, NT  Extremities: No edema  Neurologic: Resting quietly  Skin: No acute issues  Access: Rt IJ PC, left arm AV access       Basic Metabolic Panel:   Recent Labs Lab 11/03/15 1546  NA 137  K 4.2  CL 98*  CO2 30  GLUCOSE 118*  BUN 25*  CREATININE 4.73*  CALCIUM 7.6*  PHOS 3.6     CBC:  Recent Labs Lab 11/03/15 1545 11/03/15 1613  WBC 3.8  --   HGB 6.8* 7.1*  HCT 20.4* 20.3*  MCV 96.7   --   PLT 125*  --       Microbiology:  Recent Results (from the past 720 hour(s))  Blood culture (routine x 2)     Status: None   Collection Time: 10/22/15  2:15 PM  Result Value Ref Range Status   Specimen Description BLOOD RIGHT ARM  Final   Special Requests BOTTLES DRAWN AEROBIC AND ANAEROBIC 6CC  Final   Culture NO GROWTH 5 DAYS  Final   Report Status 10/27/2015 FINAL  Final  Blood culture (routine x 2)     Status: None   Collection Time: 10/22/15  2:25 PM  Result Value Ref Range Status   Specimen Description BLOOD RIGHT ARM  Final   Special Requests BOTTLES DRAWN AEROBIC AND ANAEROBIC 6CC  Final   Culture NO GROWTH 5 DAYS  Final   Report Status 10/27/2015 FINAL  Final  MRSA PCR Screening     Status: None   Collection Time: 10/22/15 10:09 PM  Result Value Ref Range Status   MRSA by PCR NEGATIVE NEGATIVE Final    Comment:        The GeneXpert MRSA Assay (FDA approved for NASAL specimens  only), is one component of a comprehensive MRSA colonization surveillance program. It is not intended to diagnose MRSA infection nor to guide or monitor treatment for MRSA infections.   Aerobic/Anaerobic Culture (surgical/deep wound)     Status: None   Collection Time: 10/26/15  3:42 PM  Result Value Ref Range Status   Specimen Description TOE RIGHT  Final   Special Requests NONE  Final   Gram Stain   Final    RARE WBC PRESENT, PREDOMINANTLY MONONUCLEAR ABUNDANT GRAM POSITIVE COCCI IN PAIRS MODERATE GRAM VARIABLE ROD    Culture   Final    FEW STENOTROPHOMONAS MALTOPHILIA NO ANAEROBES ISOLATED Performed [facility]    Report Status 10/31/2015 FINAL  Final   Organism ID, Bacteria STENOTROPHOMONAS MALTOPHILIA  Final      Susceptibility   Stenotrophomonas maltophilia - MIC*    LEVOFLOXACIN 0.25 SENSITIVE Sensitive     TRIMETH/SULFA <=20 SENSITIVE Sensitive     * FEW STENOTROPHOMONAS MALTOPHILIA  Aerobic/Anaerobic Culture (surgical/deep wound)     Status: None  (Preliminary result)   Collection Time: 11/02/15  2:36 PM  Result Value Ref Range Status   Specimen Description TOE RIGHT  Final   Special Requests NONE  Final   Gram Stain   Final    RARE WBC PRESENT,BOTH PMN AND MONONUCLEAR FEW GRAM POSITIVE RODS FEW GRAM POSITIVE COCCI Performed at [facility]    Culture   Final    CULTURE REINCUBATED FOR BETTER GROWTH NO ANAEROBES ISOLATED; CULTURE IN PROGRESS FOR 5 DAYS    Report Status PENDING  Incomplete    Coagulation Studies: No results for input(s): LABPROT, INR in the last 72 hours.  Urinalysis: No results for input(s): COLORURINE, LABSPEC, PHURINE, GLUCOSEU, HGBUR, BILIRUBINUR, KETONESUR, PROTEINUR, UROBILINOGEN, NITRITE, LEUKOCYTESUR in the last 72 hours.  Invalid input(s): APPERANCEUR    Imaging: No results found.   Medications:   . sodium chloride 50 mL/hr at 11/04/15 0200  . sodium chloride     . sodium chloride   Intravenous Once  . aspirin EC  81 mg Oral Daily  . atorvastatin  20 mg Oral q1800  . calcium gluconate 1 GM IV  1 g Intravenous Once  . Chlorhexidine Gluconate Cloth  6 each Topical Once  . cinacalcet  90 mg Oral Q supper  . clopidogrel  75 mg Oral Daily  . docusate sodium  100 mg Oral BID  . epoetin (EPOGEN/PROCRIT) injection  4,000 Units Intravenous Q T,Th,Sa-HD  . feeding supplement (NEPRO CARB STEADY)  237 mL Oral BID BM  . heparin subcutaneous  5,000 Units Subcutaneous Q8H  . levofloxacin  500 mg Oral Q48H  . lisinopril  10 mg Oral Daily  . metoprolol tartrate  12.5 mg Oral BID  . mupirocin ointment   Topical BID  . mupirocin ointment   Nasal Daily  . senna  1 tablet Oral BID  . sevelamer carbonate  1,600 mg Oral TID WC  . sodium chloride flush  3 mL Intravenous Q12H  . sodium chloride flush  3 mL Intravenous Q12H  . venlafaxine XR  37.5 mg Oral Q breakfast   sodium chloride, acetaminophen **OR** acetaminophen, HYDROcodone-acetaminophen, morphine injection, ondansetron **OR**  ondansetron (ZOFRAN) IV, polyethylene glycol, sodium chloride flush  Assessment/ Plan:  70 y.o.African American female with hypertension, diabetes mellitus type II, hyperlipidemia, thyroid disorder and ESRD on hemodialysis. Pt recently moved from [place] via [place]. Now staying with her sister.   [facility] TTS [facility]. RIJ permcath  1. End Stage Renal Disease with hyperkalemia on admission: Continue TTS schedule.  - Patient seen during dialysis Tolerating well  - Access angiogram scheduled for Monday.  - Cathter is being used at present    2.  Anemia of chronic kidney disease:  -  Low dose EPO with dialysis -  No source of acute bleeding has been identified - 2 units of blood transfusion to be given during dialysis today  3. Secondary Hyperparathyroidism: Phos 3.6 - continue sevelamer  - cinacalcet  4. Peripheral Vascular disease: Osteomyelitis with pain in right toe.  - Toe amputation  - Levaquin QOD for 7 doses   LOS: 13 [place] 7/23/20172:04 PM

## 2015-11-04 NOTE — Progress Notes (Signed)
HD COMPLETED  

## 2015-11-04 NOTE — Progress Notes (Signed)
Pt returned from dialysis. Report received from [facility] in dialysis, fluid removed, 2 units PRBC's completed. Pt tolerated well, currently sleeping. Will continue to monitor.

## 2015-11-04 NOTE — Progress Notes (Signed)
[facility] - [place] at [facility]   PATIENT NAME: Tracy Drake    MR#:  [id number]  DATE OF BIRTH:  [birth year]  SUBJECTIVE:  CHIEF COMPLAINT:   Chief Complaint  Patient presents with  . Toe Pain   The patient was about to be discharged yesterday, however, was noted to have significant anemia, discharge was canceled, patient will undergo repeated dialysis today, during which she will be transfused 1 unit of packed red blood cells. Patient denies any significant discomfort,  pain, no bleeding was noted.    Review of Systems  Constitutional: Negative for chills, fever and weight loss.  HENT: Negative for congestion.   Eyes: Negative for blurred vision and double vision.  Respiratory: Negative for cough, sputum production, shortness of breath and wheezing.   Cardiovascular: Negative for chest pain, palpitations, orthopnea, leg swelling and PND.  Gastrointestinal: Negative for abdominal pain, blood in stool, constipation, diarrhea, nausea and vomiting.  Genitourinary: Negative for dysuria, frequency, hematuria and urgency.  Musculoskeletal: Positive for joint pain. Negative for falls.  Neurological: Negative for dizziness, tremors, focal weakness and headaches.  Endo/Heme/Allergies: Does not bruise/bleed easily.  Psychiatric/Behavioral: Negative for depression. The patient does not have insomnia.     VITAL SIGNS: Blood pressure (!) 159/61, pulse 82, temperature 98.8 F (37.1 C), resp. rate 18, height 5\' 2"  (1.575 m), weight 62.5 kg (137 lb 12.8 oz), SpO2 99 %.  PHYSICAL EXAMINATION:   GENERAL:  70 y.o.-year-old patient lying in the bed with no acute distress.  EYES: Pupils equal, round, reactive to light and accommodation. No scleral icterus. Extraocular muscles intact.  HEENT: Head atraumatic, normocephalic. Oropharynx and nasopharynx clear.  NECK:  Supple, no jugular venous distention. No thyroid enlargement, no tenderness.  LUNGS: Normal breath  sounds bilaterally, no wheezing, rales,rhonchi or crepitation. No use of accessory muscles of respiration.  CARDIOVASCULAR: S1, S2 normal. No murmurs, rubs, or gallops.  ABDOMEN: Soft, nontender, nondistended. Bowel sounds present. No organomegaly or mass.  EXTREMITIES: No pedal edema, cyanosis, or clubbing. Right foot is in dressing NEUROLOGIC: Cranial nerves II through XII are intact. Muscle strength 5/5 in all extremities. Sensation intact. Gait not checked.  PSYCHIATRIC: The patient is alert and oriented x 3.  SKIN: No obvious rash, lesion, or ulcer.   ORDERS/RESULTS REVIEWED:   CBC  Recent Labs Lab 11/03/15 1545 11/03/15 1613  WBC 3.8  --   HGB 6.8* 7.1*  HCT 20.4* 20.3*  PLT 125*  --   MCV 96.7  --   MCH 32.3  --   MCHC 33.4  --   RDW 13.1  --    ------------------------------------------------------------------------------------------------------------------  Chemistries   Recent Labs Lab 11/03/15 1546  NA 137  K 4.2  CL 98*  CO2 30  GLUCOSE 118*  BUN 25*  CREATININE 4.73*  CALCIUM 7.6*   ------------------------------------------------------------------------------------------------------------------ estimated creatinine clearance is 9.5 mL/min (by C-G formula based on SCr of 4.73 mg/dL). ------------------------------------------------------------------------------------------------------------------ No results for input(s): TSH, T4TOTAL, T3FREE, THYROIDAB in the last 72 hours.  Invalid input(s): FREET3  Cardiac Enzymes No results for input(s): CKMB, TROPONINI, MYOGLOBIN in the last 168 hours.  Invalid input(s): CK ------------------------------------------------------------------------------------------------------------------ Invalid input(s): POCBNP ---------------------------------------------------------------------------------------------------------------  RADIOLOGY: No results found.  EKG:  Orders placed or performed during the hospital  encounter of 10/22/15  . ED EKG  . ED EKG  . EKG 12-Lead  . EKG 12-Lead    ASSESSMENT AND PLAN:  Principal Problem:   Toe osteomyelitis, right (HCC) Active Problems:   Osteomyelitis (  HCC)   Hyperkalemia   ESRD (end stage renal disease) (HCC)   Anemia of chronic disease   Pressure ulcer   DNR (do not resuscitate) discussion   Palliative care encounter #1. Right great toe osteomyelitis, s/p  Distal righ great toe amputation by dr Ether Griffins 7.21./17,tolerated procedure well. Recommend heel weight bearing for transfer in post op shoe. Recommend changing dressing 3 times per week with dry bulky dressing. F/U with podiatry in 2 weeks .Continue pain medications.  The patient was initially treated with Zosyn and vancomycin. Now on  Levaquin orally based on the culture results- Stenotrophomonas maltophilia . The patient is to continue antibiotics for 2 more weeks to complete course./7 doses of antibiotics every 48 hours.   #2. PAD with gangrene right foot. The patient underwent angiography July 12, sluggish flow was noted, due to severe peripheral vascular disease, not an amenable to intervention due to patient moving intermittently on procedure table. Angiogram 7.20.17 by DR Wyn Quaker: SFA occlusion, diseased popliteal artery, occlusion of proximal AT artery and peroneal arteries. S/p PTA with stent placement by Dr. Wyn Quaker.  #3 Hyperkalemia on presentation secondary to missed hemodialysis sessions. The patient underwent hemodialysis , resolved.  #4 Elevated troponin. Patient not having chest pain or shortness of breath. Patient is to continue  aspirin and metoprolol and statin. Cardiology consultation was obtained, further evaluation with pharmacologic nuclear stress test was recommended, possibly as outpatient, echocardiogram revealed normal ejection fraction, LVH, grade 1 diastolic dysfunction.  #5 End-stage renal disease. continue hemodialysis as per nephrology., per schedule, 2/4/6. Patient has  poor access flow, via right IJ PermCath, angiogram was recommended by nephrology, Dr. Wyn Quaker will proceed next week.   #6. Acute posthemorrhagic anemia in the setting of anemia of chronic disease, patient needs to be transfused, transfusion, will take place today during dialysis, discussed with Dr. Thedore Mins, follow hemoglobin level after transfusion. Epogen as needed with dialysis on chronic basis as outpatient.   #7 Hyperglycemia, fasting BS is normal. Hemoglobin A1c is low patient is not a diabetic.  #8 Essential hypertension, resumed lisinopril. continue lopressor.  #9. Generalized weakness, physical therapist evaluated the patient, recommended rehabilitation placement, likely next week after angiogram.   Management plans discussed with the patient, family and they are in agreement.   DRUG ALLERGIES: No Known Allergies  CODE STATUS:     Code Status Orders        Start     Ordered   10/30/15 1548  Do not attempt resuscitation (DNR)  Continuous    Question Answer Comment  In the event of cardiac or respiratory ARREST Do not call a "code blue"   In the event of cardiac or respiratory ARREST Do not perform Intubation, CPR, defibrillation or ACLS   In the event of cardiac or respiratory ARREST Use medication by any route, position, wound care, and other measures to relive pain and suffering. May use oxygen, suction and manual treatment of airway obstruction as needed for comfort.      10/30/15 1547    Code Status History    Date Active Date Inactive Code Status Order ID Comments User Context   10/22/2015  8:48 PM 10/30/2015  3:47 PM Full Code [id number]  Katharina Caper, MD Inpatient      TOTAL TIME TAKING CARE OF THIS PATIENT: 35 minutes.  Discussed with Dr. Lorelee Cover M.D on 11/04/2015 at 1:16 PM  Between 7am to 6pm - Pager - [phone]  After 6pm go to [email or web address] - password [email or web address]  [facility]  Office  [phone]  CC: Primary care  physician; Pcp Not In System

## 2015-11-04 NOTE — Progress Notes (Signed)
PRE DIALYSIS ASSESSMENT 

## 2015-11-04 NOTE — Progress Notes (Signed)
POST DIALYSIS ASSESSMENT 

## 2015-11-05 ENCOUNTER — Encounter: Payer: Self-pay | Admitting: Podiatry

## 2015-11-05 LAB — TYPE AND SCREEN
ABO/RH(D): O POS
Antibody Screen: NEGATIVE
Unit division: 0
Unit division: 0

## 2015-11-05 LAB — CBC
HCT: 31.5 % — ABNORMAL LOW (ref 35.0–47.0)
Hemoglobin: 10.9 g/dL — ABNORMAL LOW (ref 12.0–16.0)
MCH: 32.5 pg (ref 26.0–34.0)
MCHC: 34.6 g/dL (ref 32.0–36.0)
MCV: 94 fL (ref 80.0–100.0)
PLATELETS: 149 10*3/uL — AB (ref 150–440)
RBC: 3.35 MIL/uL — AB (ref 3.80–5.20)
RDW: 16.2 % — AB (ref 11.5–14.5)
WBC: 5.3 10*3/uL (ref 3.6–11.0)

## 2015-11-05 MED ORDER — LISINOPRIL 10 MG PO TABS
10.0000 mg | ORAL_TABLET | Freq: Two times a day (BID) | ORAL | Status: DC
  Administered 2015-11-05: 10 mg via ORAL
  Filled 2015-11-05: qty 1

## 2015-11-05 MED ORDER — AMLODIPINE BESYLATE 5 MG PO TABS
2.5000 mg | ORAL_TABLET | Freq: Every day | ORAL | Status: DC
  Administered 2015-11-05 – 2015-11-07 (×2): 2.5 mg via ORAL
  Filled 2015-11-05 (×2): qty 1

## 2015-11-05 NOTE — Progress Notes (Signed)
Dr. Ether Griffins called about dressing on right great toe. Orders given and placed.

## 2015-11-05 NOTE — Progress Notes (Signed)
Patient's D/C was cancelled over the weekend. Per MD patient is not stable for D/C today and may be ready tomorrow. Clinical Child psychotherapist (CSW) updated Administrator, arts at [employer]. CSW also left APS worker a voicemail making him aware of above. CSW also contacted patient's dialysis center [facility] on [address] and made them aware of above. CSW will continue to follow and assist as needed.   [employer], LCSW [phone]

## 2015-11-05 NOTE — Progress Notes (Signed)
[facility]  Daily Progress Note   Subjective  - 3 Days Post-Op  Patient sleeping, provides no history  Objective Vitals:   11/04/15 1545 11/04/15 1956 11/05/15 0312 11/05/15 0807  BP: (!) 167/63 (!) 165/83 (!) 179/70 (!) 178/68  Pulse:  77 79 78  Resp:  19 19   Temp: 98.3 F (36.8 C) 98.3 F (36.8 C) 98.9 F (37.2 C)   TempSrc: Oral Oral Oral   SpO2:  99% 99%   Weight:      Height:        Intake/Output Summary (Last 24 hours) at 11/05/15 0924 Last data filed at 11/05/15 0524  Gross per 24 hour  Intake              120 ml  Output                1 ml  Net              119 ml    PULM  CTAB CV  RRR VASC  Right foot dressed,  access pulsatile near the elbow  Laboratory CBC    Component Value Date/Time   WBC 5.3 11/05/2015 0403   HGB 10.9 (L) 11/05/2015 0403   HCT 31.5 (L) 11/05/2015 0403   PLT 149 (L) 11/05/2015 0403    BMET    Component Value Date/Time   NA 137 11/03/2015 1546   K 4.2 11/03/2015 1546   CL 98 (L) 11/03/2015 1546   CO2 30 11/03/2015 1546   GLUCOSE 118 (H) 11/03/2015 1546   BUN 25 (H) 11/03/2015 1546   CREATININE 4.73 (H) 11/03/2015 1546   CALCIUM 7.6 (L) 11/03/2015 1546   GFRNONAA 8 (L) 11/03/2015 1546   GFRAA 10 (L) 11/03/2015 1546    Assessment/Planning: POD #4 s/p RLE revascularization. Doing well from this.  Follow up in office in 2-3 weeks with ABIs   Also with dysfunctional dialysis access. Was scheduled for fistulagram today, but the heat and humidity in the hospital due to the chiller malfunction has caused cancellation of all cases today.  Will try to get rescheduled for tomorrow or Wednesday if problem is corrected.    Grason Brailsford  11/05/2015, 9:24 AM

## 2015-11-05 NOTE — Progress Notes (Signed)
Alert and oriented with forgetfulness, c/o pain in operated foot improved with prn pain medications, vital signs stable, afebrile, pt NPO after midnight in preparation for possible angiogram on 7/25. Dressing on operative foot not changed due to no clear documentation that surgeon has changed initial dressing, charge RN Dava notes that she will follow up. Pt resting in bed quietly, report given to [facility] at 14:45, uneventful shift.

## 2015-11-05 NOTE — Progress Notes (Signed)
Per Dr. Wyn Quaker, reinstate patients orders and make patient NPO after midnight in the event that angiogram can take place on 7/25.

## 2015-11-05 NOTE — Progress Notes (Signed)
[facility] - [place] [facility]   PATIENT NAME: Tracy Drake    MR#:  [id number]  DATE OF BIRTH:  03/18/1946  SUBJECTIVE:  CHIEF COMPLAINT:   Chief Complaint  Patient presents with  . Toe Pain   The patient Is comfortable today, mild toe pain. Patient was transfused with packed red blood cells yesterday, hemoglobin level has improved to above 10 yesterday. Patient underwent dialysis yesterday.  Shuntogram was supposed to be performed by Dr. dew today, however, due to air-conditioning failure, surgery was postponed until tomorrow or day after tomorrow .  Review of Systems  Constitutional: Negative for chills, fever and weight loss.  HENT: Negative for congestion.   Eyes: Negative for blurred vision and double vision.  Respiratory: Negative for cough, sputum production, shortness of breath and wheezing.   Cardiovascular: Negative for chest pain, palpitations, orthopnea, leg swelling and PND.  Gastrointestinal: Negative for abdominal pain, blood in stool, constipation, diarrhea, nausea and vomiting.  Genitourinary: Negative for dysuria, frequency, hematuria and urgency.  Musculoskeletal: Positive for joint pain. Negative for falls.  Neurological: Negative for dizziness, tremors, focal weakness and headaches.  Endo/Heme/Allergies: Does not bruise/bleed easily.  Psychiatric/Behavioral: Negative for depression. The patient does not have insomnia.     VITAL SIGNS: Blood pressure (!) 178/68, pulse 78, temperature 98.9 F (37.2 C), temperature source Oral, resp. rate 19, height 5\' 2"  (1.575 m), weight 65.4 kg (144 lb 2.9 oz), SpO2 99 %.  PHYSICAL EXAMINATION:   GENERAL:  70 y.o.-year-old patient lying in the bed with no acute distress.  EYES: Pupils equal, round, reactive to light and accommodation. No scleral icterus. Extraocular muscles intact.  HEENT: Head atraumatic, normocephalic. Oropharynx and nasopharynx clear.  NECK:  Supple, no jugular venous  distention. No thyroid enlargement, no tenderness.  LUNGS: Normal breath sounds bilaterally, no wheezing, rales,rhonchi or crepitation. No use of accessory muscles of respiration.  CARDIOVASCULAR: S1, S2 normal. No murmurs, rubs, or gallops.  ABDOMEN: Soft, nontender, nondistended. Bowel sounds present. No organomegaly or mass.  EXTREMITIES: No pedal edema, cyanosis, or clubbing. Right foot is in dressing NEUROLOGIC: Cranial nerves II through XII are intact. Muscle strength 5/5 in all extremities. Sensation intact. Gait not checked.  PSYCHIATRIC: The patient is alert and oriented x 3.  SKIN: No obvious rash, lesion, or ulcer.   ORDERS/RESULTS REVIEWED:   CBC  Recent Labs Lab 11/03/15 1545 11/03/15 1613 11/04/15 1817 11/05/15 0403  WBC 3.8  --   --  5.3  HGB 6.8* 7.1* 10.5* 10.9*  HCT 20.4* 20.3* 31.0* 31.5*  PLT 125*  --   --  149*  MCV 96.7  --   --  94.0  MCH 32.3  --   --  32.5  MCHC 33.4  --   --  34.6  RDW 13.1  --   --  16.2*   ------------------------------------------------------------------------------------------------------------------  Chemistries   Recent Labs Lab 11/03/15 1546  NA 137  K 4.2  CL 98*  CO2 30  GLUCOSE 118*  BUN 25*  CREATININE 4.73*  CALCIUM 7.6*   ------------------------------------------------------------------------------------------------------------------ estimated creatinine clearance is 9.7 mL/min (by C-G formula based on SCr of 4.73 mg/dL). ------------------------------------------------------------------------------------------------------------------ No results for input(s): TSH, T4TOTAL, T3FREE, THYROIDAB in the last 72 hours.  Invalid input(s): FREET3  Cardiac Enzymes No results for input(s): CKMB, TROPONINI, MYOGLOBIN in the last 168 hours.  Invalid input(s): CK ------------------------------------------------------------------------------------------------------------------ Invalid input(s):  POCBNP ---------------------------------------------------------------------------------------------------------------  RADIOLOGY: No results found.  EKG:  Orders placed or performed during the  hospital encounter of 10/22/15  . ED EKG  . ED EKG  . EKG 12-Lead  . EKG 12-Lead    ASSESSMENT AND PLAN:  Principal Problem:   Toe osteomyelitis, right (HCC) Active Problems:   Osteomyelitis (HCC)   Hyperkalemia   ESRD (end stage renal disease) (HCC)   Anemia of chronic disease   Pressure ulcer   DNR (do not resuscitate) discussion   Palliative care encounter #1. Right great toe osteomyelitis, s/p  Distal righ great toe amputation by dr Ether Griffins 7.21./17,tolerated procedure well. Recommend heel weight bearing for transfer in post op shoe. Recommend changing dressing 3 times per week with dry bulky dressing. F/U with podiatry in 2 weeks .Continue pain medications.  The patient was initially treated with Zosyn and vancomycin. Now on  Levaquin orally based on the culture results- Stenotrophomonas maltophilia . The patient is to continue antibiotics for 2 more weeks to complete course./7 doses of antibiotics every 48 hours. Pain control is satisfactory. Patient will be discharged to skilled nursing facility for rehabilitation assessment shuntogram was performed.  #2. PAD with gangrene right foot. The patient underwent angiography July 12, sluggish flow was noted, due to severe peripheral vascular disease, not an amenable to intervention due to patient moving intermittently on procedure table. Angiogram 7.20.17 by DR Wyn Quaker: SFA occlusion, diseased popliteal artery, occlusion of proximal AT artery and peroneal arteries. S/p PTA with stent placement by Dr. Wyn Quaker. Patient has poorly functioning hemodialysis shunt in left upper extremity, she is to undergo shuntogram tomorrow or day after tomorrow by Dr. Wyn Quaker  #3 Hyperkalemia on presentation secondary to missed hemodialysis sessions. The patient underwent  hemodialysis , resolved.  #4 Elevated troponin. Patient not having chest pain or shortness of breath. Patient is to continue  aspirin and metoprolol and statin. Cardiology consultation was obtained, further evaluation with pharmacologic nuclear stress test was recommended, possibly as outpatient, echocardiogram revealed normal ejection fraction, LVH, grade 1 diastolic dysfunction.  #5 End-stage renal disease. continue hemodialysis as per nephrology., per schedule, 2/4/6. Patient has poor access flow, via right IJ PermCath, left upper extremity shuntogram was recommended by nephrology, Dr. Wyn Quaker will proceed tomorrow or day after tomorrow.   #6. Acute posthemorrhagic anemia in the setting of anemia of chronic disease, status post packed red blood cell transfusion yesterday, hemoglobin level is 10.9 today. Epogen as needed with dialysis on chronic basis as outpatient.   #7 Hyperglycemia, fasting BS is normal. Hemoglobin A1c is low patient is not a diabetic.  #8 . Malignant Essential hypertension, advance lisinopril, resume Norvasc,.continue lopressor. May need to advance medications even higher as patient's blood pressure is poorly controlled  #9. Generalized weakness, physical therapist evaluated the patient, recommended rehabilitation placement, likely next week after shuntogram.   Management plans discussed with the patient, family and they are in agreement.   DRUG ALLERGIES: No Known Allergies  CODE STATUS:     Code Status Orders        Start     Ordered   10/30/15 1548  Do not attempt resuscitation (DNR)  Continuous    Question Answer Comment  In the event of cardiac or respiratory ARREST Do not call a "code blue"   In the event of cardiac or respiratory ARREST Do not perform Intubation, CPR, defibrillation or ACLS   In the event of cardiac or respiratory ARREST Use medication by any route, position, wound care, and other measures to relive pain and suffering. May use oxygen,  suction and manual treatment of airway obstruction  as needed for comfort.      10/30/15 1547    Code Status History    Date Active Date Inactive Code Status Order ID Comments User Context   10/22/2015  8:48 PM 10/30/2015  3:47 PM Full Code [id number]  Katharina Caper, MD Inpatient      TOTAL TIME TAKING CARE OF THIS PATIENT: 35 minutes.    Katharina Caper M.D on 11/05/2015 at 1:26 PM  Between 7am to 6pm - Pager - [phone]  After 6pm go to [email or web address] - password [email or web address]  [facility]  Office  [phone]  CC: Primary care physician; Pcp Not In System

## 2015-11-05 NOTE — Progress Notes (Signed)
Subjective:  Patient had hemodialysis yesterday. Hemoglobin up to 10.9 posttransfusion. Resting comfortably in bed.   Objective:  Vital signs in last 24 hours:  Temp:  [98.3 F (36.8 C)-98.9 F (37.2 C)] 98.9 F (37.2 C) (07/24 0312) Pulse Rate:  [66-79] 78 (07/24 0807) Resp:  [13-19] 19 (07/24 0312) BP: (132-179)/(51-83) 178/68 (07/24 0807) SpO2:  [99 %] 99 % (07/24 0312) Weight:  [65.4 kg (144 lb 2.9 oz)] 65.4 kg (144 lb 2.9 oz) (07/23 1300)  Weight change: -0.8 kg (-1 lb 12.2 oz) Filed Weights   11/03/15 1540 11/03/15 1936 11/04/15 1300  Weight: 66.2 kg (145 lb 15.1 oz) 62.5 kg (137 lb 12.8 oz) 65.4 kg (144 lb 2.9 oz)    Intake/Output:    Intake/Output Summary (Last 24 hours) at 11/05/15 1247 Last data filed at 11/05/15 1103  Gross per 24 hour  Intake              120 ml  Output                1 ml  Net              119 ml     Physical Exam: General: NAD, laying in bed  HEENT anicteric  Neck supple  Pulm/lungs Clear, normal effort  CVS/Heart No rub or gallop  Abdomen:  Soft, NT  Extremities: No edema, right foot wrapped  Neurologic: Awake, alert, following commands  Skin: No acute issues  Access: Rt IJ PC, left arm AV access       Basic Metabolic Panel:   Recent Labs Lab 11/03/15 1546  NA 137  K 4.2  CL 98*  CO2 30  GLUCOSE 118*  BUN 25*  CREATININE 4.73*  CALCIUM 7.6*  PHOS 3.6     CBC:  Recent Labs Lab 11/03/15 1545 11/03/15 1613 11/04/15 1817 11/05/15 0403  WBC 3.8  --   --  5.3  HGB 6.8* 7.1* 10.5* 10.9*  HCT 20.4* 20.3* 31.0* 31.5*  MCV 96.7  --   --  94.0  PLT 125*  --   --  149*      Microbiology:  Recent Results (from the past 720 hour(s))  Blood culture (routine x 2)     Status: None   Collection Time: 10/22/15  2:15 PM  Result Value Ref Range Status   Specimen Description BLOOD RIGHT ARM  Final   Special Requests BOTTLES DRAWN AEROBIC AND ANAEROBIC 6CC  Final   Culture NO GROWTH 5 DAYS  Final   Report Status  10/27/2015 FINAL  Final  Blood culture (routine x 2)     Status: None   Collection Time: 10/22/15  2:25 PM  Result Value Ref Range Status   Specimen Description BLOOD RIGHT ARM  Final   Special Requests BOTTLES DRAWN AEROBIC AND ANAEROBIC 6CC  Final   Culture NO GROWTH 5 DAYS  Final   Report Status 10/27/2015 FINAL  Final  MRSA PCR Screening     Status: None   Collection Time: 10/22/15 10:09 PM  Result Value Ref Range Status   MRSA by PCR NEGATIVE NEGATIVE Final    Comment:        The GeneXpert MRSA Assay (FDA approved for NASAL specimens only), is one component of a comprehensive MRSA colonization surveillance program. It is not intended to diagnose MRSA infection nor to guide or monitor treatment for MRSA infections.   Aerobic/Anaerobic Culture (surgical/deep wound)     Status: None   Collection  Time: 10/26/15  3:42 PM  Result Value Ref Range Status   Specimen Description TOE RIGHT  Final   Special Requests NONE  Final   Gram Stain   Final    RARE WBC PRESENT, PREDOMINANTLY MONONUCLEAR ABUNDANT GRAM POSITIVE COCCI IN PAIRS MODERATE GRAM VARIABLE ROD    Culture   Final    FEW STENOTROPHOMONAS MALTOPHILIA NO ANAEROBES ISOLATED Performed at [facility]    Report Status 10/31/2015 FINAL  Final   Organism ID, Bacteria STENOTROPHOMONAS MALTOPHILIA  Final      Susceptibility   Stenotrophomonas maltophilia - MIC*    LEVOFLOXACIN 0.25 SENSITIVE Sensitive     TRIMETH/SULFA <=20 SENSITIVE Sensitive     * FEW STENOTROPHOMONAS MALTOPHILIA  Aerobic/Anaerobic Culture (surgical/deep wound)     Status: None (Preliminary result)   Collection Time: 11/02/15  2:36 PM  Result Value Ref Range Status   Specimen Description TOE RIGHT  Final   Special Requests NONE  Final   Gram Stain   Final    RARE WBC PRESENT,BOTH PMN AND MONONUCLEAR FEW GRAM POSITIVE RODS FEW GRAM POSITIVE COCCI Performed at [facility]    Culture   Final    NORMAL SKIN FLORA NO ANAEROBES  ISOLATED; CULTURE IN PROGRESS FOR 5 DAYS    Report Status PENDING  Incomplete    Coagulation Studies: No results for input(s): LABPROT, INR in the last 72 hours.  Urinalysis: No results for input(s): COLORURINE, LABSPEC, PHURINE, GLUCOSEU, HGBUR, BILIRUBINUR, KETONESUR, PROTEINUR, UROBILINOGEN, NITRITE, LEUKOCYTESUR in the last 72 hours.  Invalid input(s): APPERANCEUR    Imaging: No results found.   Medications:   . sodium chloride 50 mL/hr at 11/04/15 0200  . sodium chloride     . aspirin EC  81 mg Oral Daily  . atorvastatin  20 mg Oral q1800  . calcium gluconate 1 GM IV  1 g Intravenous Once  . cinacalcet  90 mg Oral Q supper  . clopidogrel  75 mg Oral Daily  . docusate sodium  100 mg Oral BID  . epoetin (EPOGEN/PROCRIT) injection  4,000 Units Intravenous Q T,Th,Sa-HD  . feeding supplement (NEPRO CARB STEADY)  237 mL Oral BID BM  . heparin subcutaneous  5,000 Units Subcutaneous Q8H  . levofloxacin  500 mg Oral Q48H  . lisinopril  10 mg Oral Daily  . metoprolol tartrate  12.5 mg Oral BID  . mupirocin ointment   Topical BID  . mupirocin ointment   Nasal Daily  . senna  1 tablet Oral BID  . sevelamer carbonate  1,600 mg Oral TID WC  . sodium chloride flush  3 mL Intravenous Q12H  . venlafaxine XR  37.5 mg Oral Q breakfast   acetaminophen **OR** acetaminophen, HYDROcodone-acetaminophen, morphine injection, ondansetron **OR** ondansetron (ZOFRAN) IV, polyethylene glycol  Assessment/ Plan:  70 y.o.African American female with hypertension, diabetes mellitus type II, hyperlipidemia, thyroid disorder and ESRD on hemodialysis. Pt recently moved from [place] via [place]. Now staying with her sister.   [facility] TTS [address] RIJ permcath  1. End Stage Renal Disease with hyperkalemia on admission: Continue TTS schedule.  - patient had hemodialysis yesterday.  Vascular case was canceled today secondary to no air conditioning in the hospital.  Hopefully this can  be reassessed tomorrow.   2.  Anemia of chronic kidney disease:  -  Hemoglobin up to 10.9 post 2 units of blood transfusion yesterday.  Continue to monitor.  3. Secondary Hyperparathyroidism: Phos 3.6 -continue Renvela and Sensipar.  4. Peripheral Vascular disease: Osteomyelitis with pain in right toe.  - Toe amputation  - Levaquin QOD for 7 doses   LOS: 14 Jeidi Gilles 7/24/201712:47 PM

## 2015-11-05 NOTE — Progress Notes (Signed)
Pharmacy Antibiotic Note  Tracy Drake is a 70 y.o. female admitted on 10/22/2015 with wound infection.  Pharmacy has been consulted for levofloxacin dosing.  Per podiatry recommendation, MD wishes to start Levofloxacin.  Plan: The dose of levofloxacin will be adjusted to 750 mg po once then 500 mg po q48h based on renal function.(Hemodialysis patient)  Height: 5\' 2"  (157.5 cm) Weight: 144 lb 2.9 oz (65.4 kg) IBW/kg (Calculated) : 50.1  Temp (24hrs), Avg:98.1 F (36.7 C), Min:97.2 F (36.2 C), Max:98.9 F (37.2 C)   Recent Labs Lab 11/03/15 1545 11/03/15 1546 11/05/15 0403  WBC 3.8  --  5.3  CREATININE  --  4.73*  --     Estimated Creatinine Clearance: 9.7 mL/min (by C-G formula based on SCr of 4.73 mg/dL).    No Known Allergies  Antimicrobials this admission: Vancomycin 7/10 >> 7/14 Zosyn 7/10 >> 7/17 Levofloxacin 7/17>>   Microbiology results: 7/10 BCx: NG 7/14 WCx: few stenotrophomonas maltophilia 7/10 MRSA PCR: negatvie  Thank you for allowing pharmacy to be a part of this patient's care.  Tracy Drake A 11/05/2015 4:14 PM

## 2015-11-06 ENCOUNTER — Encounter
Admission: EM | Disposition: A | Discharge status: Skilled Nursing Facility | Payer: Self-pay | Source: Home / Self Care | Attending: Internal Medicine

## 2015-11-06 HISTORY — PX: PERIPHERAL VASCULAR CATHETERIZATION: SHX172C

## 2015-11-06 LAB — GLUCOSE, CAPILLARY: GLUCOSE-CAPILLARY: 73 mg/dL (ref 65–99)

## 2015-11-06 LAB — SURGICAL PATHOLOGY

## 2015-11-06 LAB — PHOSPHORUS: PHOSPHORUS: 3.4 mg/dL (ref 2.5–4.6)

## 2015-11-06 SURGERY — A/V SHUNTOGRAM/FISTULAGRAM
Anesthesia: Moderate Sedation

## 2015-11-06 MED ORDER — MIDAZOLAM HCL 5 MG/5ML IJ SOLN
INTRAMUSCULAR | Status: AC
  Filled 2015-11-06: qty 5

## 2015-11-06 MED ORDER — LISINOPRIL 20 MG PO TABS
20.0000 mg | ORAL_TABLET | Freq: Two times a day (BID) | ORAL | Status: DC
  Administered 2015-11-06 – 2015-11-07 (×2): 20 mg via ORAL
  Filled 2015-11-06 (×2): qty 1

## 2015-11-06 MED ORDER — FENTANYL CITRATE (PF) 100 MCG/2ML IJ SOLN
INTRAMUSCULAR | Status: AC
  Filled 2015-11-06: qty 2

## 2015-11-06 MED ORDER — LIDOCAINE HCL (PF) 1 % IJ SOLN
INTRAMUSCULAR | Status: AC
  Filled 2015-11-06: qty 30

## 2015-11-06 MED ORDER — NEPRO/CARBSTEADY PO LIQD
237.0000 mL | Freq: Three times a day (TID) | ORAL | Status: DC
  Administered 2015-11-07: 237 mL via ORAL

## 2015-11-06 MED ORDER — FENTANYL CITRATE (PF) 100 MCG/2ML IJ SOLN
INTRAMUSCULAR | Status: DC | PRN
  Administered 2015-11-06: 50 ug via INTRAVENOUS
  Administered 2015-11-06: 25 ug via INTRAVENOUS

## 2015-11-06 MED ORDER — DEXTROSE 5 % IV SOLN
1.5000 g | Freq: Once | INTRAVENOUS | Status: AC
  Administered 2015-11-06: 1.5 g via INTRAVENOUS

## 2015-11-06 MED ORDER — HEPARIN (PORCINE) IN NACL 2-0.9 UNIT/ML-% IJ SOLN
INTRAMUSCULAR | Status: AC
  Filled 2015-11-06: qty 1000

## 2015-11-06 MED ORDER — DEXTROSE 5 % IV SOLN: INTRAVENOUS | Status: DC

## 2015-11-06 MED ORDER — IOPAMIDOL (ISOVUE-300) INJECTION 61%
INTRAVENOUS | Status: DC | PRN
  Administered 2015-11-06: 40 mL via INTRAVENOUS

## 2015-11-06 MED ORDER — HEPARIN SODIUM (PORCINE) 1000 UNIT/ML IJ SOLN
INTRAMUSCULAR | Status: AC
  Filled 2015-11-06: qty 1

## 2015-11-06 MED ORDER — MIDAZOLAM HCL 2 MG/2ML IJ SOLN
INTRAMUSCULAR | Status: DC | PRN
  Administered 2015-11-06: 1 mg via INTRAVENOUS
  Administered 2015-11-06: 0.5 mg via INTRAVENOUS

## 2015-11-06 SURGICAL SUPPLY — 15 items
BALLN DORADO 10X80X80 (BALLOONS) ×3
BALLN DORADO 6X80X80 (BALLOONS) ×3
BALLN LUTONIX DCB 7X40X130 (BALLOONS) ×6
BALLOON DORADO 10X80X80 (BALLOONS) ×1 IMPLANT
BALLOON DORADO 6X80X80 (BALLOONS) ×1 IMPLANT
BALLOON LUTONIX DCB 7X40X130 (BALLOONS) ×2 IMPLANT
CANNULA 5F STIFF (CANNULA) ×3 IMPLANT
CATH KA2 5FR 65CM (CATHETERS) ×3 IMPLANT
DEVICE PRESTO INFLATION (MISCELLANEOUS) ×3 IMPLANT
DRAPE BRACHIAL (DRAPES) ×3 IMPLANT
PACK ANGIOGRAPHY (CUSTOM PROCEDURE TRAY) ×3 IMPLANT
SHEATH BRITE TIP 6FRX5.5 (SHEATH) ×3 IMPLANT
SHEATH BRITE TIP 7FRX5.5 (SHEATH) ×3 IMPLANT
TOWEL OR 17X26 4PK STRL BLUE (TOWEL DISPOSABLE) ×3 IMPLANT
WIRE MAGIC TORQUE 260C (WIRE) ×3 IMPLANT

## 2015-11-06 NOTE — Progress Notes (Signed)
LUA swelling noted with x3 pitting edema. Extremity elevated on 2 pillows. Per report from previous shift treatment team aware.

## 2015-11-06 NOTE — Progress Notes (Signed)
Pre HD  

## 2015-11-06 NOTE — Progress Notes (Signed)
Pt returned form dialysis in good spirits, talkative. C/O pain to R foot, 2 Norco given. Diet order changed, pt will receive chopped meats with gravy, soft foods, continue renal diet, meals to be served in bowls to assist pt with self feeding. Pt reports she is happy with changes, ate well for dinner this evening.   Left arm noted with moderate swelling, non-pitting. MD aware. Bruit and thrill present to left arm fistula. Pt denies pain. Elevated left arm with 2 pillows, will continue to monitor.

## 2015-11-06 NOTE — Progress Notes (Signed)
Post HD vitals, CCMD notified.

## 2015-11-06 NOTE — Progress Notes (Signed)
Start of HD tx 

## 2015-11-06 NOTE — Progress Notes (Signed)
Report received from Carleene Overlie, RN in dialysis. Pt tolerated dialysis well, 2 liters removed. Unable to complete dialysis using fistula, see note from Dr. Cherylann Ratel.

## 2015-11-06 NOTE — Op Note (Signed)
OPERATIVE NOTE   PROCEDURE: 1. Contrast injection left arm brachial cephalic  AV access 2. Percutaneous transluminal angioplasty to 8 mm cephalic vein left arm 3. Percutaneous transluminal angioplasty to 10 mm left innominate vein 4. Ultrasound-guided access to the left arm brachiocephalic fistula  PRE-OPERATIVE DIAGNOSIS: Complication of dialysis access                                                       End Stage Renal Disease  POST-OPERATIVE DIAGNOSIS: same as above   SURGEON: Katha Cabal, M.D.  ANESTHESIA: Conscious sedation was administered under my direct supervision. IV Versed plus fentanyl were utilized. Continuous ECG, pulse oximetry and blood pressure was monitored throughout the entire procedure.  Conscious sedation was for a total of 60.  ESTIMATED BLOOD LOSS: minimal  FINDING(S): Stricture of the AV graft  SPECIMEN(S):  None  CONTRAST: 30 cc  FLUOROSCOPY TIME: 3 minutes  INDICATIONS: Tracy Drake is a 70 y.o. female who  presents with malfunctioning left AV access.  The patient is scheduled for angiography with possible intervention of the AV access.  The patient is aware the risks include but are not limited to: bleeding, infection, thrombosis of the cannulated access, and possible anaphylactic reaction to the contrast.  The patient acknowledges if the access can not be salvaged a tunneled catheter will be needed and will be placed during this procedure.  The patient is aware of the risks of the procedure and elects to proceed with the angiogram and intervention.  DESCRIPTION: After full informed written consent was obtained, the patient was brought back to the Special Procedure suite and placed supine position.  Appropriate cardiopulmonary monitors were placed.  The left arm was prepped and draped in the standard fashion.  Appropriate timeout is called. Ultrasound is placed in a sterile sleeve. The fistula is then interrogated with the ultrasound it is  found to be echolucent and compressible indicating patency. Images recorded for the permanent record. The fistula  was cannulated with a micropuncture needle under direct ultrasound visualization.  The microwire was advanced and the needle was exchanged for  a microsheath.  The J-wire was then advanced and a 6 Fr sheath inserted.  Hand injections were completed to image the access from the arterial anastomosis through the entire access.  The central venous structures were also imaged by hand injections. Reflux imaging was also utilized to show the arterial anastomosis which was widely patent as well as the visualized portions of the brachial artery.  Based on the images,  3000 units of heparin was given and a wire was negotiated through the strictures within the venous portion of the cephalic vein near the shoulder.  An 6 x 60 balloon was used.  Inflations were to 16 atm for 1 minute. Subsequent leg, a 7 x 40 Lutonix balloon was utilized again the inflation was to 14 atm for 2 minutes. Finally an 8 mm Dorado balloon was utilized in the cephalic vein with the inflation to 16 atm for 1 minute.  The central in-stent restenosis was then addressed this was treated initially with a 6 mm balloon inflation and then a 7 mm Lutonix balloon inflation to 14 atm for 2 minutes finally a 10 mm x 80 mm Dorado balloon was inflated across the innominate lesion for approximately 1-1/2 minutes at 18 atm.  Follow-up imaging demonstrates complete resolution of the strictures with rapid flow of contrast through the fistula and central veins, the central venous anatomy is preserved.  A 4-0 Monocryl purse-string suture was sewn around the sheath.  The sheath was removed and light pressure was applied.  A sterile bandage was applied to the puncture site.    COMPLICATIONS: None  CONDITION: Tracy Drake, M.D [facility] Office: [phone]  11/06/2015 11:21 AM

## 2015-11-06 NOTE — Anesthesia Postprocedure Evaluation (Addendum)
Anesthesia Post Note  Patient: Tracy Drake, Tracy Drake  Procedure(s) Performed: Procedure(s) (LRB): AMPUTATION TOE (Right)  Patient location during evaluation: PACU Anesthesia Type: General Level of consciousness: awake and alert Pain management: pain level controlled Vital Signs Assessment: post-procedure vital signs reviewed and stable Respiratory status: spontaneous breathing, nonlabored ventilation and respiratory function stable Cardiovascular status: blood pressure returned to baseline and stable Postop Assessment: no signs of nausea or vomiting Anesthetic complications: no    Last Vitals:  Vitals:   11/06/15 1430 11/06/15 1500  BP: (!) 171/57 (!) 165/57  Pulse: 67 71  Resp: 15 12  Temp:      Last Pain:  Vitals:   11/06/15 1225  TempSrc: Oral  PainSc: 0-No pain                 Tracy Drake

## 2015-11-06 NOTE — Progress Notes (Signed)
Report received from [place], [place]. Pt completed shuntagram/fistulagam. See report for details. Will be transferred to dialysis and then return to floor.

## 2015-11-06 NOTE — Progress Notes (Signed)
End of HD 

## 2015-11-06 NOTE — Progress Notes (Signed)
PRE HD assessment 

## 2015-11-06 NOTE — Progress Notes (Signed)
Per MD patient may be stable for D/C tomorrow. Clinical Child psychotherapist (CSW) made Administrator, arts at [employer] aware of above. CSW will continue to follow and assist as needed.   [employer], LCSW [phone]

## 2015-11-06 NOTE — Progress Notes (Signed)
POST HD assessment 

## 2015-11-06 NOTE — Progress Notes (Signed)
[facility] - [place] [facility]   PATIENT NAME: Tracy Drake    MR#:  [id number]  DATE OF BIRTH:  Jun 14, 1945  SUBJECTIVE:  CHIEF COMPLAINT:   Chief Complaint  Patient presents with  . Toe Pain   The patient Is comfortable today, mild toe pain. Patient was transfused with packed red blood cells yesterday, hemoglobin level has improved to above 10 yesterday. Patient underwent dialysis yesterday.  Shuntogram will  to be performed by Dr. Wyn Quaker today, Then patient will undergo dialysis, likely discharged to skilled nursing facility for a bili patient tomorrow. Patient admits of pain in the foot, however the pain usually improves with 2 pills of Norco, per nursing staff   Review of Systems  Constitutional: Negative for chills, fever and weight loss.  HENT: Negative for congestion.   Eyes: Negative for blurred vision and double vision.  Respiratory: Negative for cough, sputum production, shortness of breath and wheezing.   Cardiovascular: Negative for chest pain, palpitations, orthopnea, leg swelling and PND.  Gastrointestinal: Negative for abdominal pain, blood in stool, constipation, diarrhea, nausea and vomiting.  Genitourinary: Negative for dysuria, frequency, hematuria and urgency.  Musculoskeletal: Positive for joint pain. Negative for falls.  Neurological: Negative for dizziness, tremors, focal weakness and headaches.  Endo/Heme/Allergies: Does not bruise/bleed easily.  Psychiatric/Behavioral: Negative for depression. The patient does not have insomnia.     VITAL SIGNS: Blood pressure (!) 167/57, pulse 71, temperature 98.5 F (36.9 C), temperature source Oral, resp. rate 14, height 5\' 2"  (1.575 m), weight 65.4 kg (144 lb 2.9 oz), SpO2 99 %.  PHYSICAL EXAMINATION:   GENERAL:  70 y.o.-year-old patient lying in the bed with no acute distress.  EYES: Pupils equal, round, reactive to light and accommodation. No scleral icterus. Extraocular muscles  intact.  HEENT: Head atraumatic, normocephalic. Oropharynx and nasopharynx clear.  NECK:  Supple, no jugular venous distention. No thyroid enlargement, no tenderness.  LUNGS: Normal breath sounds bilaterally, no wheezing, rales,rhonchi or crepitation. No use of accessory muscles of respiration.  CARDIOVASCULAR: S1, S2 normal. No murmurs, rubs, or gallops.  ABDOMEN: Soft, nontender, nondistended. Bowel sounds present. No organomegaly or mass.  EXTREMITIES: No pedal edema, cyanosis, or clubbing. Right foot is in dressing. Left upper extremity is swollen, positive thrill and bruit of AV fistula.  NEUROLOGIC: Cranial nerves II through XII are intact. Muscle strength 5/5 in all extremities. Sensation intact. Gait not checked.  PSYCHIATRIC: The patient is alert and oriented x 3.  SKIN: No obvious rash, lesion, or ulcer.   ORDERS/RESULTS REVIEWED:   CBC  Recent Labs Lab 11/03/15 1545 11/03/15 1613 11/04/15 1817 11/05/15 0403  WBC 3.8  --   --  5.3  HGB 6.8* 7.1* 10.5* 10.9*  HCT 20.4* 20.3* 31.0* 31.5*  PLT 125*  --   --  149*  MCV 96.7  --   --  94.0  MCH 32.3  --   --  32.5  MCHC 33.4  --   --  34.6  RDW 13.1  --   --  16.2*   ------------------------------------------------------------------------------------------------------------------  Chemistries   Recent Labs Lab 11/03/15 1546  NA 137  K 4.2  CL 98*  CO2 30  GLUCOSE 118*  BUN 25*  CREATININE 4.73*  CALCIUM 7.6*   ------------------------------------------------------------------------------------------------------------------ estimated creatinine clearance is 9.7 mL/min (by C-G formula based on SCr of 4.73 mg/dL). ------------------------------------------------------------------------------------------------------------------ No results for input(s): TSH, T4TOTAL, T3FREE, THYROIDAB in the last 72 hours.  Invalid input(s): FREET3  Cardiac Enzymes No  results for input(s): CKMB, TROPONINI, MYOGLOBIN in the last  168 hours.  Invalid input(s): CK ------------------------------------------------------------------------------------------------------------------ Invalid input(s): POCBNP ---------------------------------------------------------------------------------------------------------------  RADIOLOGY: No results found.  EKG:  Orders placed or performed during the hospital encounter of 10/22/15  . ED EKG  . ED EKG  . EKG 12-Lead  . EKG 12-Lead    ASSESSMENT AND PLAN:  Principal Problem:   Toe osteomyelitis, right (HCC) Active Problems:   Osteomyelitis (HCC)   Hyperkalemia   ESRD (end stage renal disease) (HCC)   Anemia of chronic disease   Pressure ulcer   DNR (do not resuscitate) discussion   Palliative care encounter #1. Right great toe osteomyelitis, s/p  Distal righ great toe amputation by dr Ether Griffins 7.21./17,tolerated procedure well. Recommend heel weight bearing for transfer in post op shoe. Recommend changing dressing 3 times per week with dry bulky dressing. F/U with podiatry in 2 weeks .Continue pain medications.  The patient was initially treated with Zosyn and vancomycin. Now on  Levaquin orally based on the culture results- Stenotrophomonas maltophilia . The patient is to continue antibiotics for 2 more weeks to complete course./7 doses of antibiotics every 48 hours. Pain control is satisfactory. Patient will be discharged to skilled nursing facility for rehabilitation As soon as shuntogram is performed and functioning. Permacath will be discontinued after 3 successful dialysis sessions via shuntogram.  #2. PAD with gangrene right foot. The patient underwent angiography July 12, sluggish flow was noted, due to severe peripheral vascular disease, not an amenable to intervention due to patient moving intermittently on procedure table. Angiogram 7.20.17 by DR Wyn Quaker: SFA occlusion, diseased popliteal artery, occlusion of proximal AT artery and peroneal arteries. S/p PTA with stent  placement by Dr. Wyn Quaker. Patient has poorly functioning hemodialysis shunt in left upper extremity, she is to undergo shuntogram today by Dr. Wyn Quaker. Hemodialysis today, permacath to be discontinued, only after 3 successful dialysis sessions via shuntogram, per Dr. Cherylann Ratel, nephrologist  #3 Hyperkalemia on presentation secondary to missed hemodialysis sessions. The patient underwent hemodialysis , resolved.  #4 Elevated troponin. Patient not having chest pain or shortness of breath. Patient is to continue  aspirin and metoprolol and statin. Cardiology consultation was obtained, further evaluation with pharmacologic nuclear stress test was recommended, possibly as outpatient, echocardiogram revealed normal ejection fraction, LVH, grade 1 diastolic dysfunction.  #5 End-stage renal disease. Continue hemodialysis as per nephrology., per schedule, 2/4/6. Patient has poor access flow,  now is to get shuntogram today, as above, patient's right IJ PermCath to be discontinued, only after the dialysis's sessions via the left upper extremity AV fistula.    #6. Acute posthemorrhagic anemia in the setting of anemia of chronic disease, status post packed red blood cell transfusion yesterday, hemoglobin level is 10.9 today. Epogen as needed with dialysis on chronic basis as outpatient.   #7 Hyperglycemia, fasting BS is normal. Hemoglobin A1c is low patient is not a diabetic.  #8 . Malignant Essential hypertension, advance lisinopril again , resume Norvasc,.continue lopressor. May need to advance medications even higher as patient's blood pressure is poorly controlled  #9. Generalized weakness, physical therapist evaluated the patient, recommended rehabilitation placement, likely tomorrow if AV fistula works with dialysis   Management plans discussed with the patient, family and they are in agreement.   DRUG ALLERGIES: No Known Allergies  CODE STATUS:     Code Status Orders        Start     Ordered    10/30/15 1548  Do not attempt resuscitation (DNR)  Continuous    Question Answer Comment  In the event of cardiac or respiratory ARREST Do not call a "code blue"   In the event of cardiac or respiratory ARREST Do not perform Intubation, CPR, defibrillation or ACLS   In the event of cardiac or respiratory ARREST Use medication by any route, position, wound care, and other measures to relive pain and suffering. May use oxygen, suction and manual treatment of airway obstruction as needed for comfort.      10/30/15 1547    Code Status History    Date Active Date Inactive Code Status Order ID Comments User Context   10/22/2015  8:48 PM 10/30/2015  3:47 PM Full Code [id number]  Katharina Caper, MD Inpatient      TOTAL TIME TAKING CARE OF THIS PATIENT: 35 minutes.    Katharina Caper M.D on 11/06/2015 at 12:04 PM  Between 7am to 6pm - Pager - [phone]  After 6pm go to [email or web address] - password [email or web address]  [facility]  Office  [phone]  CC: Primary care physician; Pcp Not In System

## 2015-11-06 NOTE — Progress Notes (Signed)
Nutrition Follow-up  DOCUMENTATION CODES:   Not applicable  INTERVENTION:   Await diet progression as medically able. Discussed with room service appropriateness with RN. RN recommending 'Yes with Assist' when able to advance. RN to assist pt with eating. Will increase Nepro to TID as Nsg reports pt likes and drinks well.    NUTRITION DIAGNOSIS:   Increased nutrient needs related to wound healing as evidenced by estimated needs; being addressed with supplements   GOAL:   Patient will meet greater than or equal to 90% of their needs; ongoing  MONITOR:   PO intake, Supplement acceptance, Labs, Weight trends, I & O's  REASON FOR ASSESSMENT:   LOS    ASSESSMENT:   Pt admitted with gangrenous toe; scheduled for vascular intervention 7/20.  Pt with h/o ESRD on HD.   Pt s/p toe amputation 11/02/2015. Fistulogram this am per MD note secondary to poorly functioning HD shunt in left upper extremity. Pt at dialysis this afternoon. Per MD note plan for pt to discharge possibly tomorrow to rehab.  Diet Order:  Diet - low sodium heart healthy Diet NPO time specified    Current Nutrition: Pt NPO today for procedure this am. RN Verlon Au had pt over the weekend and reports that pt drinks Nepro very well BID. RN reports pt will have tray in front of her on table but may not eat it as she cannot see well what is on her plate and therefore does not want it.  RN aided pt over the weekend in getting things she likes and then able to eat. RN reports pt does best when she has foods in small cups to eat from rather than a large plate.  Per MD consult comment, pt reported that she was not able to  Feed herself at home.  RD notes pt plan to discharge to rehab.     Gastrointestinal Profile: Last BM: 11/05/2015  Medications: Colace, Renvela, Senna, Sensipar, D5 at 34mL/hr Labs: Phosphorus WDL this am   Nutrition-Focused Physical Exam Findings:  Unable to complete Nutrition-Focused physical exam  at this time.    Weight Trend since Admission: Filed Weights   11/03/15 1936 11/04/15 1300 11/06/15 1225  Weight: 137 lb 12.8 oz (62.5 kg) 144 lb 2.9 oz (65.4 kg) 149 lb 11.1 oz (67.9 kg)    Skin:   (Stage II hip pressure ulcer)   BMI:  Body mass index is 27.38 kg/m.  Estimated Nutritional Needs:   Kcal:  1890-2200kcals  Protein:  75-100g protein  Fluid:  UOP+1L  EDUCATION NEEDS:   Education needs no appropriate at this time  Leda Quail, RD, LDN Pager [phone] Weekend/On-Call Pager [phone]

## 2015-11-06 NOTE — Progress Notes (Signed)
Subjective:  Patient seen and evaluated during HD. Had fistulogram today. Unable to access venous portion of access. Therefore catheter being used.  Objective:  Vital signs in last 24 hours:  Temp:  [97.2 F (36.2 C)-98.6 F (37 C)] 98.4 F (36.9 C) (07/25 1225) Pulse Rate:  [64-86] 64 (07/25 1400) Resp:  [10-18] 15 (07/25 1400) BP: (155-180)/(57-84) 155/57 (07/25 1400) SpO2:  [96 %-100 %] 100 % (07/25 1400) Weight:  [67.9 kg (149 lb 11.1 oz)] 67.9 kg (149 lb 11.1 oz) (07/25 1225)  Weight change:  Filed Weights   11/03/15 1936 11/04/15 1300 11/06/15 1225  Weight: 62.5 kg (137 lb 12.8 oz) 65.4 kg (144 lb 2.9 oz) 67.9 kg (149 lb 11.1 oz)    Intake/Output:    Intake/Output Summary (Last 24 hours) at 11/06/15 1416 Last data filed at 11/06/15 0900  Gross per 24 hour  Intake          1231.67 ml  Output                0 ml  Net          1231.67 ml     Physical Exam: General: NAD, laying in bed  HEENT anicteric  Neck supple  Pulm/lungs Clear, normal effort  CVS/Heart No rub or gallop  Abdomen:  Soft, NT  Extremities: No edema, right foot wrapped  Neurologic: Awake, alert, following commands  Skin: No acute issues  Access: Rt IJ PC, left arm AV access       Basic Metabolic Panel:   Recent Labs Lab 11/03/15 1546 11/06/15 1251  NA 137  --   K 4.2  --   CL 98*  --   CO2 30  --   GLUCOSE 118*  --   BUN 25*  --   CREATININE 4.73*  --   CALCIUM 7.6*  --   PHOS 3.6 3.4     CBC:  Recent Labs Lab 11/03/15 1545 11/03/15 1613 11/04/15 1817 11/05/15 0403  WBC 3.8  --   --  5.3  HGB 6.8* 7.1* 10.5* 10.9*  HCT 20.4* 20.3* 31.0* 31.5*  MCV 96.7  --   --  94.0  PLT 125*  --   --  149*      Microbiology:  Recent Results (from the past 720 hour(s))  Blood culture (routine x 2)     Status: None   Collection Time: 10/22/15  2:15 PM  Result Value Ref Range Status   Specimen Description BLOOD RIGHT ARM  Final   Special Requests BOTTLES DRAWN AEROBIC AND  ANAEROBIC 6CC  Final   Culture NO GROWTH 5 DAYS  Final   Report Status 10/27/2015 FINAL  Final  Blood culture (routine x 2)     Status: None   Collection Time: 10/22/15  2:25 PM  Result Value Ref Range Status   Specimen Description BLOOD RIGHT ARM  Final   Special Requests BOTTLES DRAWN AEROBIC AND ANAEROBIC 6CC  Final   Culture NO GROWTH 5 DAYS  Final   Report Status 10/27/2015 FINAL  Final  MRSA PCR Screening     Status: None   Collection Time: 10/22/15 10:09 PM  Result Value Ref Range Status   MRSA by PCR NEGATIVE NEGATIVE Final    Comment:        The GeneXpert MRSA Assay (FDA approved for NASAL specimens only), is one component of a comprehensive MRSA colonization surveillance program. It is not intended to diagnose MRSA infection nor to guide  or monitor treatment for MRSA infections.   Aerobic/Anaerobic Culture (surgical/deep wound)     Status: None   Collection Time: 10/26/15  3:42 PM  Result Value Ref Range Status   Specimen Description TOE RIGHT  Final   Special Requests NONE  Final   Gram Stain   Final    RARE WBC PRESENT, PREDOMINANTLY MONONUCLEAR ABUNDANT GRAM POSITIVE COCCI IN PAIRS MODERATE GRAM VARIABLE ROD    Culture   Final    FEW STENOTROPHOMONAS MALTOPHILIA NO ANAEROBES ISOLATED Performed at [facility]    Report Status 10/31/2015 FINAL  Final   Organism ID, Bacteria STENOTROPHOMONAS MALTOPHILIA  Final      Susceptibility   Stenotrophomonas maltophilia - MIC*    LEVOFLOXACIN 0.25 SENSITIVE Sensitive     TRIMETH/SULFA <=20 SENSITIVE Sensitive     * FEW STENOTROPHOMONAS MALTOPHILIA  Aerobic/Anaerobic Culture (surgical/deep wound)     Status: None (Preliminary result)   Collection Time: 11/02/15  2:36 PM  Result Value Ref Range Status   Specimen Description TOE RIGHT  Final   Special Requests NONE  Final   Gram Stain   Final    RARE WBC PRESENT,BOTH PMN AND MONONUCLEAR FEW GRAM POSITIVE RODS FEW GRAM POSITIVE COCCI Performed at [facility]    Culture   Final    NORMAL SKIN FLORA NO ANAEROBES ISOLATED; CULTURE IN PROGRESS FOR 5 DAYS    Report Status PENDING  Incomplete    Coagulation Studies: No results for input(s): LABPROT, INR in the last 72 hours.  Urinalysis: No results for input(s): COLORURINE, LABSPEC, PHURINE, GLUCOSEU, HGBUR, BILIRUBINUR, KETONESUR, PROTEINUR, UROBILINOGEN, NITRITE, LEUKOCYTESUR in the last 72 hours.  Invalid input(s): APPERANCEUR    Imaging: No results found.   Medications:   . dextrose     . amLODipine  2.5 mg Oral Daily  . aspirin EC  81 mg Oral Daily  . atorvastatin  20 mg Oral q1800  . calcium gluconate 1 GM IV  1 g Intravenous Once  . cinacalcet  90 mg Oral Q supper  . clopidogrel  75 mg Oral Daily  . docusate sodium  100 mg Oral BID  . epoetin (EPOGEN/PROCRIT) injection  4,000 Units Intravenous Q T,Th,Sa-HD  . feeding supplement (NEPRO CARB STEADY)  237 mL Oral BID BM  . heparin subcutaneous  5,000 Units Subcutaneous Q8H  . levofloxacin  500 mg Oral Q48H  . lisinopril  20 mg Oral BID  . metoprolol tartrate  12.5 mg Oral BID  . mupirocin ointment   Topical BID  . mupirocin ointment   Nasal Daily  . senna  1 tablet Oral BID  . sevelamer carbonate  1,600 mg Oral TID WC  . sodium chloride flush  3 mL Intravenous Q12H  . venlafaxine XR  37.5 mg Oral Q breakfast   acetaminophen **OR** acetaminophen, HYDROcodone-acetaminophen, morphine injection, ondansetron **OR** ondansetron (ZOFRAN) IV, polyethylene glycol  Assessment/ Plan:  70 y.o.African American female with hypertension, diabetes mellitus type II, hyperlipidemia, thyroid disorder and ESRD on hemodialysis. Pt recently moved from [place] via [place]. Now staying with her sister.   [facility] TTS [address]. RIJ permcath  1. End Stage Renal Disease with hyperkalemia on admission: Continue TTS schedule.  - Pt seen during HD, tolerating well, she had fistulogram today, couldn't access venous side of  fistula, using catheter at the moment.   2.  Anemia of chronic kidney disease:  -  Hemoglobin up to 10.9 post 2 units of blood transfusion yesterday,  consider epogen as outpt.  3. Secondary Hyperparathyroidism: Phos 3.4 today. -continue Renvela and Sensipar.  4. Peripheral Vascular disease: Osteomyelitis with pain in right toe.  - Toe amputation  - Levaquin QOD for 7 doses   LOS: 15 Tracy Drake 7/25/20172:16 PM

## 2015-11-07 ENCOUNTER — Encounter: Payer: Self-pay | Admitting: Vascular Surgery

## 2015-11-07 DIAGNOSIS — I10 Essential (primary) hypertension: Secondary | ICD-10-CM

## 2015-11-07 DIAGNOSIS — I739 Peripheral vascular disease, unspecified: Secondary | ICD-10-CM

## 2015-11-07 DIAGNOSIS — R739 Hyperglycemia, unspecified: Secondary | ICD-10-CM

## 2015-11-07 DIAGNOSIS — M7989 Other specified soft tissue disorders: Secondary | ICD-10-CM

## 2015-11-07 DIAGNOSIS — R531 Weakness: Secondary | ICD-10-CM

## 2015-11-07 LAB — AEROBIC/ANAEROBIC CULTURE W GRAM STAIN (SURGICAL/DEEP WOUND): Culture: NORMAL

## 2015-11-07 MED ORDER — HYDROCODONE-ACETAMINOPHEN 5-325 MG PO TABS: 1.0000 | ORAL_TABLET | ORAL | 0 refills | Status: AC | PRN

## 2015-11-07 MED ORDER — AMLODIPINE BESYLATE 2.5 MG PO TABS: 2.5000 mg | ORAL_TABLET | Freq: Every day | ORAL | 5 refills | Status: AC

## 2015-11-07 MED ORDER — LISINOPRIL 20 MG PO TABS: 20.0000 mg | ORAL_TABLET | Freq: Two times a day (BID) | ORAL | 6 refills | Status: DC

## 2015-11-07 MED ORDER — NEPRO/CARBSTEADY PO LIQD: 237.0000 mL | Freq: Three times a day (TID) | ORAL | 6 refills | Status: AC

## 2015-11-07 NOTE — Discharge Summary (Signed)
[facility] - [place] [facility]   PATIENT NAME: Tracy Drake    MR#:  [id number]  DATE OF BIRTH:  04-18-1945  DATE OF ADMISSION:  10/22/2015 ADMITTING PHYSICIAN: Katharina Caper, MD  DATE OF DISCHARGE: 11/03/2015 PRIMARY CARE PHYSICIAN: Pcp Not In System    ADMISSION DIAGNOSIS:  Acute hyperkalemia [E87.5] Osteomyelitis (HCC) [M86.9] Toe infection [L08.9] Acute osteomyelitis of toe, right (HCC) [M86.171]  DISCHARGE DIAGNOSIS:  Principal Problem:   Toe osteomyelitis, right (HCC) Active Problems:   Osteomyelitis (HCC)   Peripheral artery disease (HCC)   Hyperkalemia   Essential hypertension, malignant   Left upper extremity swelling   ESRD (end stage renal disease) (HCC)   Acute posthemorrhagic anemia   Pressure ulcer   DNR (do not resuscitate) discussion   Palliative care encounter   Hyperglycemia   Generalized weakness  PVD SECONDARY DIAGNOSIS:   Past Medical History:  Diagnosis Date  . Diabetes mellitus (HCC)    a. not on medications  . Diabetic neuropathy (HCC)   . ESRD (end stage renal disease) on dialysis (HCC)    on dialysis  . Essential hypertension   . History of stroke   . HLD (hyperlipidemia)     HOSPITAL COURSE:  #1. Right great toe osteomyelitis, s/p Distal righ great toe amputation by dr Ether Griffins 7.21./17,tolerated procedure well. Recommend heel weight bearing for transfer in post op shoe. Recommend changing dressing 3 times per week with dry bulky dressing. F/U with podiatry in 2 weeks .Continue pain medications. The patient was initially treated with Zosyn and vancomycin. Now on Levaquin orally based on the culture results- Stenotrophomonas maltophilia . The patient is to continue antibiotics for 2 more weeks to complete course./7 doses of antibiotics every 48 hours. Pain control is satisfactory. Patient will be discharged to skilled nursing facility for rehabilitation today.  Shuntogram was performed  11/06/2015 by  Dr. Gilda Crease . Due to difficulty accessing venous portion of left upper extremity AV fistula, Permacath was used to for dialysis on 11/06/2015. Further workup as outpatient  #2. PAD with gangrene right foot. The patient underwent angiography July 12, sluggish flow was noted, due to severe peripheral vascular disease, not an amenable to intervention due to patient moving intermittently on procedure table. Angiogram 7.20.17 by DR Wyn Quaker: SFA occlusion, diseased popliteal artery, occlusion of proximal AT artery and peroneal arteries. S/p PTA with stent placement by Dr. Wyn Quaker. Patient has poorly functioning hemodialysis access in left upper extremity, she underwent shuntogram 25th of July 2017 by Dr. Gilda Crease , PTA was performed, however, there was difficulty to accessing dialysis access for hemodialysis  after shuntogram, so permacath is to be continued as needed, but Dr. Cherylann Ratel  #3 Hyperkalemia on presentation secondary to missed hemodialysis sessions. The patient underwent hemodialysis , resolved.  #4 Elevated troponin. Patient not having chest pain or shortness of breath. Patient is to continue aspirin and metoprolol and statin. Cardiology consultation was obtained, further evaluation with pharmacologic nuclear stress test was recommended, possibly as outpatient, echocardiogram revealed normal ejection fraction, LVH, grade 1 diastolic dysfunction.  #5 End-stage renal disease. Continue hemodialysis as per nephrology., per schedule, 2/4/6. Patient has poor access flow,  , status post shuntogram 25th of July 2017  by Dr. Gilda Crease, PTA was performed, still difficulty accessing venous portion of AV fistula, so patient's right IJ PermCath to be continue to be used, further workup as outpatient  #6. Acute posthemorrhagic anemia after operation in the setting of anemia of chronic disease, status post packed  red blood cell transfusion , hemoglobin level has improved. Epogen as needed with dialysis on chronic  basis as outpatient.   #7 Hyperglycemia, fasting BS is normal. Hemoglobin A1c is low patient is not a diabetic.  #8 . Malignant Essential hypertension, now on advanced lisinopril , Norvasc,.lopressor. Blood pressure is better controlled, however may need to advance medications even higher as needed.  #9. Generalized weakness, physical therapist evaluated the patient, recommended rehabilitation placement today  DISCHARGE CONDITIONS:   stable  CONSULTS OBTAINED:  Treatment Team:  Lamont Dowdy, MD Recardo Evangelist, DPM Annice Needy, MD  DRUG ALLERGIES:  No Known Allergies  DISCHARGE MEDICATIONS:   Current Discharge Medication List    START taking these medications   Details  clopidogrel (PLAVIX) 75 MG tablet Take 1 tablet (75 mg total) by mouth daily. Qty: 30 tablet, Refills: 6    docusate sodium (COLACE) 100 MG capsule Take 1 capsule (100 mg total) by mouth 2 (two) times daily. Qty: 10 capsule, Refills: 0    HYDROcodone-acetaminophen (NORCO/VICODIN) 5-325 MG tablet Take 1-2 tablets by mouth every 4 (four) hours as needed for moderate pain or severe pain. Qty: 30 tablet, Refills: 0    levofloxacin (LEVAQUIN) 500 MG tablet Take 1 tablet (500 mg total) by mouth every other day. Qty: 7 tablet, Refills: 0    metoprolol tartrate (LOPRESSOR) 25 MG tablet Take 0.5 tablets (12.5 mg total) by mouth 2 (two) times daily. Qty: 60 tablet, Refills: 6    mupirocin ointment (BACTROBAN) 2 % Place into the nose daily. Qty: 22 g, Refills: 0    Nutritional Supplements (FEEDING SUPPLEMENT, NEPRO CARB STEADY,) LIQD Take 237 mLs by mouth 3 (three) times daily between meals. Qty: 90 Can, Refills: 6    polyethylene glycol (MIRALAX / GLYCOLAX) packet Take 17 g by mouth daily as needed for mild constipation. Qty: 14 each, Refills: 0    senna (SENOKOT) 8.6 MG TABS tablet Take 1 tablet (8.6 mg total) by mouth 2 (two) times daily. Qty: 120 each, Refills: 0      CONTINUE these medications  which have CHANGED   Details  amLODipine (NORVASC) 2.5 MG tablet Take 1 tablet (2.5 mg total) by mouth daily. Qty: 30 tablet, Refills: 5    lisinopril (PRINIVIL,ZESTRIL) 20 MG tablet Take 1 tablet (20 mg total) by mouth 2 (two) times daily. Qty: 60 tablet, Refills: 6      CONTINUE these medications which have NOT CHANGED   Details  aspirin EC 81 MG tablet Take 81 mg by mouth daily.    atorvastatin (LIPITOR) 20 MG tablet Take 20 mg by mouth daily at 6 PM.    cinacalcet (SENSIPAR) 90 MG tablet Take 90 mg by mouth daily with supper.    sevelamer carbonate (RENVELA) 800 MG tablet Take 1,600 mg by mouth 3 (three) times daily with meals.    venlafaxine XR (EFFEXOR-XR) 37.5 MG 24 hr capsule Take 37.5 mg by mouth daily with breakfast.         DISCHARGE INSTRUCTIONS:    DIET:  Renal and ADA diet.  DISCHARGE CONDITION:    ACTIVITY:  As tolerated.Recommend heel weight bearing for transfer in post op shoe  DISCHARGE LOCATION:   If you experience worsening of your admission symptoms, develop shortness of breath, life threatening emergency, suicidal or homicidal thoughts you must seek medical attention immediately by calling 911 or calling your MD immediately  if symptoms less severe.  You Must read complete instructions/literature along with all the possible adverse  reactions/side effects for all the Medicines you take and that have been prescribed to you. Take any new Medicines after you have completely understood and accpet all the possible adverse reactions/side effects.   Please note  You were cared for by a hospitalist during your hospital stay. If you have any questions about your discharge medications or the care you received while you were in the hospital after you are discharged, you can call the unit and asked to speak with the hospitalist on call if the hospitalist that took care of you is not available. Once you are discharged, your primary care physician will handle any  further medical issues. Please note that NO REFILLS for any discharge medications will be authorized once you are discharged, as it is imperative that you return to your primary care physician (or establish a relationship with a primary care physician if you do not have one) for your aftercare needs so that they can reassess your need for medications and monitor your lab values.    On the day of Discharge:  VITAL SIGNS:  Blood pressure (!) 152/71, pulse 81, temperature 99.3 F (37.4 C), temperature source Oral, resp. rate 19, height 5\' 2"  (1.575 m), weight 61.9 kg (136 lb 6.4 oz), SpO2 99 %.  PHYSICAL EXAMINATION:  GENERAL:  70 y.o.-year-old patient lying in the bed with no acute distress.  EYES: Pupils equal, round, reactive to light and accommodation. No scleral icterus. Extraocular muscles intact.  HEENT: Head atraumatic, normocephalic. Oropharynx and nasopharynx clear.  NECK:  Supple, no jugular venous distention. No thyroid enlargement, no tenderness.  LUNGS: Normal breath sounds bilaterally, no wheezing, rales,rhonchi or crepitation. No use of accessory muscles of respiration.  CARDIOVASCULAR: S1, S2 normal. No murmurs, rubs, or gallops.  ABDOMEN: Soft, non-tender, non-distended. Bowel sounds present. No organomegaly or mass.  EXTREMITIES: No pedal edema, cyanosis, or clubbing. Right big toe in dressing. NEUROLOGIC: Cranial nerves II through XII are intact. Muscle strength 4/5 in all extremities. Sensation intact. Gait not checked.  PSYCHIATRIC: The patient is alert and oriented x 3.  SKIN: No obvious rash, lesion, or ulcer.  DATA REVIEW:   CBC  Recent Labs Lab 11/05/15 0403  WBC 5.3  HGB 10.9*  HCT 31.5*  PLT 149*    Chemistries   Recent Labs Lab 11/03/15 1546  NA 137  K 4.2  CL 98*  CO2 30  GLUCOSE 118*  BUN 25*  CREATININE 4.73*  CALCIUM 7.6*    Cardiac Enzymes No results for input(s): TROPONINI in the last 168 hours.  Microbiology Results  Results for  orders placed or performed during the hospital encounter of 10/22/15  Blood culture (routine x 2)     Status: None   Collection Time: 10/22/15  2:15 PM  Result Value Ref Range Status   Specimen Description BLOOD RIGHT ARM  Final   Special Requests BOTTLES DRAWN AEROBIC AND ANAEROBIC 6CC  Final   Culture NO GROWTH 5 DAYS  Final   Report Status 10/27/2015 FINAL  Final  Blood culture (routine x 2)     Status: None   Collection Time: 10/22/15  2:25 PM  Result Value Ref Range Status   Specimen Description BLOOD RIGHT ARM  Final   Special Requests BOTTLES DRAWN AEROBIC AND ANAEROBIC 6CC  Final   Culture NO GROWTH 5 DAYS  Final   Report Status 10/27/2015 FINAL  Final  MRSA PCR Screening     Status: None   Collection Time: 10/22/15 10:09 PM  Result  Value Ref Range Status   MRSA by PCR NEGATIVE NEGATIVE Final    Comment:        The GeneXpert MRSA Assay (FDA approved for NASAL specimens only), is one component of a comprehensive MRSA colonization surveillance program. It is not intended to diagnose MRSA infection nor to guide or monitor treatment for MRSA infections.   Aerobic/Anaerobic Culture (surgical/deep wound)     Status: None   Collection Time: 10/26/15  3:42 PM  Result Value Ref Range Status   Specimen Description TOE RIGHT  Final   Special Requests NONE  Final   Gram Stain   Final    RARE WBC PRESENT, PREDOMINANTLY MONONUCLEAR ABUNDANT GRAM POSITIVE COCCI IN PAIRS MODERATE GRAM VARIABLE ROD    Culture   Final    FEW STENOTROPHOMONAS MALTOPHILIA NO ANAEROBES ISOLATED Performed at [facility]    Report Status 10/31/2015 FINAL  Final   Organism ID, Bacteria STENOTROPHOMONAS MALTOPHILIA  Final      Susceptibility   Stenotrophomonas maltophilia - MIC*    LEVOFLOXACIN 0.25 SENSITIVE Sensitive     TRIMETH/SULFA <=20 SENSITIVE Sensitive     * FEW STENOTROPHOMONAS MALTOPHILIA  Aerobic/Anaerobic Culture (surgical/deep wound)     Status: None (Preliminary result)    Collection Time: 11/02/15  2:36 PM  Result Value Ref Range Status   Specimen Description TOE RIGHT  Final   Special Requests NONE  Final   Gram Stain   Final    RARE WBC PRESENT,BOTH PMN AND MONONUCLEAR FEW GRAM POSITIVE RODS FEW GRAM POSITIVE COCCI Performed at [facility]    Culture   Final    NORMAL SKIN FLORA NO ANAEROBES ISOLATED; CULTURE IN PROGRESS FOR 5 DAYS    Report Status PENDING  Incomplete    RADIOLOGY:  No results found.   Management plans discussed with the patient, family and they are in agreement.  CODE STATUS:     Code Status Orders        Start     Ordered   10/30/15 1548  Do not attempt resuscitation (DNR)   Continuous    Question Answer Comment  In the event of cardiac or respiratory ARREST Do not call a "code blue"   In the event of cardiac or respiratory ARREST Do not perform Intubation, CPR, defibrillation or ACLS   In the event of cardiac or respiratory ARREST Use medication by any route, position, wound care, and other measures to relive pain and suffering. May use oxygen, suction and manual treatment of airway obstruction as needed for comfort.      10/30/15 1547    Code Status History    Date Active Date Inactive Code Status Order ID Comments User Context   10/22/2015  8:48 PM 10/30/2015  3:47 PM Full Code [id number]  Katharina Caper, MD Inpatient      TOTAL TIME TAKING CARE OF THIS PATIENT: 40 minutes.    Katharina Caper M.D on 11/07/2015 at 8:57 AM  Between 7am to 6pm - Pager - [phone]  After 6pm go to [email or web address] - password [email or web address]  [facility]  Office  [phone]  CC: Primary care physician; Pcp Not In System   Note: This dictation was prepared with Dragon dictation along with smaller phrase technology. Any transcriptional errors that result from this process are unintentional.

## 2015-11-07 NOTE — Clinical Social Work Placement (Signed)
   CLINICAL SOCIAL WORK PLACEMENT  NOTE  Date:  11/07/2015  Patient Details  Name: Tracy Drake MRN: [id number] Date of Birth: October 26, 1945  Clinical Social Work is seeking post-discharge placement for this patient at the Skilled  Nursing Facility level of care (*CSW will initial, date and re-position this form in  chart as items are completed):  Yes   Patient/family provided with [place] Clinical Social Work Department's list of facilities offering this level of care within the geographic area requested by the patient (or if unable, by the patient's family).  Yes   Patient/family informed of their freedom to choose among providers that offer the needed level of care, that participate in Medicare, Medicaid or managed care program needed by the patient, have an available bed and are willing to accept the patient.  Yes   Patient/family informed of [facility]'s ownership interest in [facility] and [facility], as well as of the fact that they are under no obligation to receive care at these facilities.  PASRR submitted to EDS on 10/24/15     PASRR number received on 10/24/15     Existing PASRR number confirmed on       FL2 transmitted to all facilities in geographic area requested by pt/family on 10/24/15     FL2 transmitted to all facilities within larger geographic area on       Patient informed that his/her managed care company has contracts with or will negotiate with certain facilities, including the following:        Yes   Patient/family informed of bed offers received.  Patient chooses bed at  [facility] )     Physician recommends and patient chooses bed at      Patient to be transferred to [facility] on 11/03/15.  Patient to be transferred to facility by [facility]     Patient family notified on 11/07/15 of transfer.  Name of family member notified:  Pt's sister, Sedalia Muta (CSW left patient's sister [name] a Engineer, technical sales)      PHYSICIAN       Additional Comment:    _______________________________________________ Viveca Beckstrom, Darleen Crocker, LCSW 11/07/2015, 9:36 AM

## 2015-11-07 NOTE — Progress Notes (Signed)
Patient was discharged to [facility] healthcare. Report called to [facility]. Central line removed with cath intact. IJ left in place to right chest. Dressing to right foot was changed by MD this shift. PPP. Belongings sent with patient

## 2015-11-07 NOTE — Progress Notes (Addendum)
Patient is medically stable for D/C to [facility] today. Per Administrator, arts at [facility] patient will go to room 3-B. RN will call report and arrange EMS for transport. Clinical Child psychotherapist (CSW) sent D/C orders to [facility] via [employer]. CSW also sent D/C Summary to patient's dialysis center [facility] [address]. CSW also contacted APS worker Randi and made her aware of above. Patient is aware of above. CSW attempted to contact patient's sister Sedalia Muta however she did not answer a voicemail was left. Please reconsult if future social work needs arise. CSW signing off.   Patient's sister Robert Bellow called CSW back. CSW made sister aware of above. Sister is in agreement with plan.   [employer], LCSW [phone]

## 2015-11-07 NOTE — Care Management Important Message (Signed)
Important Message  Patient Details  Name: Tracy Drake MRN: [id number] Date of Birth: 10-10-1945   Medicare Important Message Given:  Yes    Adonis Huguenin, RN 11/07/2015, 8:58 AM

## 2015-11-07 NOTE — Progress Notes (Signed)
Subjective:  Patient completed hemodialysis yesterday. She is resting in bed this a.m. She complains of pain in her right foot. Nursing was notified about this.  Objective:  Vital signs in last 24 hours:  Temp:  [97.7 F (36.5 C)-99.3 F (37.4 C)] 98.5 F (36.9 C) (07/26 0925) Pulse Rate:  [64-81] 70 (07/26 0925) Resp:  [10-19] 17 (07/26 0925) BP: (134-179)/(39-84) 151/53 (07/26 0925) SpO2:  [98 %-100 %] 98 % (07/26 0925) Weight:  [61.9 kg (136 lb 6.4 oz)-67.9 kg (149 lb 11.1 oz)] 61.9 kg (136 lb 6.4 oz) (07/26 0318)  Weight change:  Filed Weights   11/06/15 1225 11/06/15 1619 11/07/15 0318  Weight: 67.9 kg (149 lb 11.1 oz) 62.7 kg (138 lb 3.7 oz) 61.9 kg (136 lb 6.4 oz)    Intake/Output:    Intake/Output Summary (Last 24 hours) at 11/07/15 1110 Last data filed at 11/07/15 0400  Gross per 24 hour  Intake               20 ml  Output             2000 ml  Net            -1980 ml     Physical Exam: General: NAD, laying in bed  HEENT anicteric  Neck supple  Pulm/lungs Clear, normal effort  CVS/Heart No rub or gallop  Abdomen:  Soft, NT  Extremities: No edema, right foot wrapped  Neurologic: Awake, alert, following commands  Skin: No acute issues  Access: Rt IJ PC, left arm AV access       Basic Metabolic Panel:   Recent Labs Lab 11/03/15 1546 11/06/15 1251  NA 137  --   K 4.2  --   CL 98*  --   CO2 30  --   GLUCOSE 118*  --   BUN 25*  --   CREATININE 4.73*  --   CALCIUM 7.6*  --   PHOS 3.6 3.4     CBC:  Recent Labs Lab 11/03/15 1545 11/03/15 1613 11/04/15 1817 11/05/15 0403  WBC 3.8  --   --  5.3  HGB 6.8* 7.1* 10.5* 10.9*  HCT 20.4* 20.3* 31.0* 31.5*  MCV 96.7  --   --  94.0  PLT 125*  --   --  149*      Microbiology:  Recent Results (from the past 720 hour(s))  Blood culture (routine x 2)     Status: None   Collection Time: 10/22/15  2:15 PM  Result Value Ref Range Status   Specimen Description BLOOD RIGHT ARM  Final   Special Requests BOTTLES DRAWN AEROBIC AND ANAEROBIC 6CC  Final   Culture NO GROWTH 5 DAYS  Final   Report Status 10/27/2015 FINAL  Final  Blood culture (routine x 2)     Status: None   Collection Time: 10/22/15  2:25 PM  Result Value Ref Range Status   Specimen Description BLOOD RIGHT ARM  Final   Special Requests BOTTLES DRAWN AEROBIC AND ANAEROBIC 6CC  Final   Culture NO GROWTH 5 DAYS  Final   Report Status 10/27/2015 FINAL  Final  MRSA PCR Screening     Status: None   Collection Time: 10/22/15 10:09 PM  Result Value Ref Range Status   MRSA by PCR NEGATIVE NEGATIVE Final    Comment:        The GeneXpert MRSA Assay (FDA approved for NASAL specimens only), is one component of a comprehensive MRSA colonization surveillance  program. It is not intended to diagnose MRSA infection nor to guide or monitor treatment for MRSA infections.   Aerobic/Anaerobic Culture (surgical/deep wound)     Status: None   Collection Time: 10/26/15  3:42 PM  Result Value Ref Range Status   Specimen Description TOE RIGHT  Final   Special Requests NONE  Final   Gram Stain   Final    RARE WBC PRESENT, PREDOMINANTLY MONONUCLEAR ABUNDANT GRAM POSITIVE COCCI IN PAIRS MODERATE GRAM VARIABLE ROD    Culture   Final    FEW STENOTROPHOMONAS MALTOPHILIA NO ANAEROBES ISOLATED Performed at [facility]    Report Status 10/31/2015 FINAL  Final   Organism ID, Bacteria STENOTROPHOMONAS MALTOPHILIA  Final      Susceptibility   Stenotrophomonas maltophilia - MIC*    LEVOFLOXACIN 0.25 SENSITIVE Sensitive     TRIMETH/SULFA <=20 SENSITIVE Sensitive     * FEW STENOTROPHOMONAS MALTOPHILIA  Aerobic/Anaerobic Culture (surgical/deep wound)     Status: None (Preliminary result)   Collection Time: 11/02/15  2:36 PM  Result Value Ref Range Status   Specimen Description TOE RIGHT  Final   Special Requests NONE  Final   Gram Stain   Final    RARE WBC PRESENT,BOTH PMN AND MONONUCLEAR FEW GRAM POSITIVE RODS FEW  GRAM POSITIVE COCCI    Culture   Final    NORMAL SKIN FLORA NO ANAEROBES ISOLATED Performed at [facility]    Report Status PENDING  Incomplete    Coagulation Studies: No results for input(s): LABPROT, INR in the last 72 hours.  Urinalysis: No results for input(s): COLORURINE, LABSPEC, PHURINE, GLUCOSEU, HGBUR, BILIRUBINUR, KETONESUR, PROTEINUR, UROBILINOGEN, NITRITE, LEUKOCYTESUR in the last 72 hours.  Invalid input(s): APPERANCEUR    Imaging: No results found.   Medications:     . amLODipine  2.5 mg Oral Daily  . aspirin EC  81 mg Oral Daily  . atorvastatin  20 mg Oral q1800  . calcium gluconate 1 GM IV  1 g Intravenous Once  . cinacalcet  90 mg Oral Q supper  . clopidogrel  75 mg Oral Daily  . docusate sodium  100 mg Oral BID  . epoetin (EPOGEN/PROCRIT) injection  4,000 Units Intravenous Q T,Th,Sa-HD  . feeding supplement (NEPRO CARB STEADY)  237 mL Oral TID BM  . heparin subcutaneous  5,000 Units Subcutaneous Q8H  . levofloxacin  500 mg Oral Q48H  . lisinopril  20 mg Oral BID  . metoprolol tartrate  12.5 mg Oral BID  . mupirocin ointment   Topical BID  . mupirocin ointment   Nasal Daily  . senna  1 tablet Oral BID  . sevelamer carbonate  1,600 mg Oral TID WC  . sodium chloride flush  3 mL Intravenous Q12H  . venlafaxine XR  37.5 mg Oral Q breakfast   acetaminophen **OR** acetaminophen, HYDROcodone-acetaminophen, morphine injection, ondansetron **OR** ondansetron (ZOFRAN) IV, polyethylene glycol  Assessment/ Plan:  70 y.o.African American female with hypertension, diabetes mellitus type II, hyperlipidemia, thyroid disorder and ESRD on hemodialysis. Pt recently moved from [place] via [place]. Now staying with her sister.   [facility] TTS [address] RIJ permcath  1. End Stage Renal Disease with hyperkalemia on admission: Continue TTS schedule.  - Patient completed dialysis yesterday. No acute indication for dialysis at the moment. We will  plan for dialysis again tomorrow if patient is still here. Discussed case with vascular surgery yesterday. We will need to continue to use her right internal jugular PermCath  for now.  2.  Anemia of chronic kidney disease:  -  Patient status post blood transfusion 2 units. Continue Epogen 4000 units IV with dialysis as well.  3. Secondary Hyperparathyroidism: Phosphorus 3.4 yesterday. Continue current doses of Renvela and Sensipar. Follow-up phosphorus tomorrow with dialysis.   4. Peripheral Vascular disease: Osteomyelitis with pain in right toe.  - Toe amputation  - Levaquin QOD for 7 doses - Patient complaining of pain in her right foot. Notify nursing of this.   LOS: 16 Tracy Drake 7/26/201711:10 AM

## 2015-11-22 ENCOUNTER — Other Ambulatory Visit: Payer: Self-pay | Admitting: Vascular Surgery

## 2015-11-29 ENCOUNTER — Encounter
Admission: RE | Disposition: A | Discharge status: Skilled Nursing Facility | Payer: Self-pay | Source: Ambulatory Visit | Attending: Vascular Surgery

## 2015-11-29 ENCOUNTER — Ambulatory Visit
Admission: RE | Admit: 2015-11-29 | Discharge: 2015-11-29 | Disposition: A | Discharge status: Skilled Nursing Facility | Payer: Medicare Other | Source: Ambulatory Visit | Attending: Vascular Surgery | Admitting: Vascular Surgery

## 2015-11-29 DIAGNOSIS — E038 Other specified hypothyroidism: Secondary | ICD-10-CM | POA: Diagnosis not present

## 2015-11-29 DIAGNOSIS — Z7982 Long term (current) use of aspirin: Secondary | ICD-10-CM | POA: Diagnosis not present

## 2015-11-29 DIAGNOSIS — I739 Peripheral vascular disease, unspecified: Secondary | ICD-10-CM | POA: Insufficient documentation

## 2015-11-29 DIAGNOSIS — Z89421 Acquired absence of other right toe(s): Secondary | ICD-10-CM | POA: Insufficient documentation

## 2015-11-29 DIAGNOSIS — Z992 Dependence on renal dialysis: Secondary | ICD-10-CM | POA: Diagnosis not present

## 2015-11-29 DIAGNOSIS — Z452 Encounter for adjustment and management of vascular access device: Secondary | ICD-10-CM | POA: Insufficient documentation

## 2015-11-29 DIAGNOSIS — E1122 Type 2 diabetes mellitus with diabetic chronic kidney disease: Secondary | ICD-10-CM | POA: Insufficient documentation

## 2015-11-29 DIAGNOSIS — D631 Anemia in chronic kidney disease: Secondary | ICD-10-CM | POA: Insufficient documentation

## 2015-11-29 DIAGNOSIS — N186 End stage renal disease: Secondary | ICD-10-CM | POA: Insufficient documentation

## 2015-11-29 HISTORY — PX: PERIPHERAL VASCULAR CATHETERIZATION: SHX172C

## 2015-11-29 SURGERY — DIALYSIS/PERMA CATHETER REMOVAL
Anesthesia: Moderate Sedation

## 2015-11-29 MED ORDER — LIDOCAINE-EPINEPHRINE (PF) 1 %-1:200000 IJ SOLN
INTRAMUSCULAR | Status: DC | PRN
  Administered 2015-11-29: 10 mL via INTRADERMAL

## 2015-11-29 SURGICAL SUPPLY — 1 items: TRAY LACERAT/PLASTIC (MISCELLANEOUS) ×3 IMPLANT

## 2015-11-29 NOTE — Progress Notes (Signed)
Patient tolerated procedure well. Area dressed. Dressing is dry. Patient denies any pain. Awaiting transport back to SNF.

## 2015-11-29 NOTE — Op Note (Signed)
Operative Note     Preoperative diagnosis:   1. ESRD with functional permanent access  Postoperative diagnosis:  1. ESRD with functional permanent access  Procedure:  Removal of right jugular Permcath  Surgeon:  Festus BarrenJason Kabeer Hoagland, MD  Anesthesia:  Local  EBL:  Minimal  Indication for the Procedure:  The patient has a functional permanent dialysis access and no longer needs their permcath.  This can be removed.  Risks and benefits are discussed and informed consent is obtained.  Description of the Procedure:  The patient's right neck, chest and existing catheter were sterilely prepped and draped. The area around the catheter was anesthetized copiously with 1% lidocaine. The catheter was dissected out with curved hemostats until the cuff was freed from the surrounding fibrous sheath. The fiber sheath was transected, and the catheter was then removed in its entirety using gentle traction. Pressure was held and sterile dressings were placed. The patient tolerated the procedure well and was taken to the recovery room in stable condition.     Mansour Balboa  11/29/2015, 12:19 PM

## 2015-11-29 NOTE — H&P (Signed)
  [facility] History & Physical Update  The patient was interviewed and re-examined.  The patient's previous History and Physical has been reviewed and is unchanged.  There is no change in the plan of care. We plan to proceed with the scheduled procedure.  Cuba Natarajan, MD  11/29/2015, 10:39 AM

## 2015-11-30 ENCOUNTER — Encounter: Payer: Self-pay | Admitting: Vascular Surgery

## 2016-01-23 ENCOUNTER — Encounter: Payer: Self-pay | Admitting: *Deleted

## 2016-03-24 ENCOUNTER — Encounter (INDEPENDENT_AMBULATORY_CARE_PROVIDER_SITE_OTHER): Payer: Self-pay

## 2016-03-24 ENCOUNTER — Other Ambulatory Visit (INDEPENDENT_AMBULATORY_CARE_PROVIDER_SITE_OTHER): Payer: Self-pay | Admitting: Vascular Surgery

## 2016-03-24 ENCOUNTER — Telehealth (INDEPENDENT_AMBULATORY_CARE_PROVIDER_SITE_OTHER): Payer: Self-pay

## 2016-03-24 MED ORDER — DEXTROSE 5 % IV SOLN: 1.5000 g | INTRAVENOUS | Status: DC

## 2016-03-24 NOTE — Telephone Encounter (Signed)
During the weekend a fax was sent over stating the patient needed a declot, I called the dialysis center and was told they knew it was sent over during the weekend so they wouldn't forget to send on Monday. Patient has not dialyzed since Thursday I believe. I attempted to contact someone at [facility] and was unable to contact anyone and had to leave a message regarding the patient.

## 2016-03-26 ENCOUNTER — Inpatient Hospital Stay
Admission: RE | Admit: 2016-03-26 | Discharge: 2016-03-28 | DRG: 628 | Disposition: A | Discharge status: Skilled Nursing Facility | Payer: Medicare Other | Source: Ambulatory Visit | Attending: Internal Medicine | Admitting: Internal Medicine

## 2016-03-26 ENCOUNTER — Encounter: Payer: Self-pay | Admitting: *Deleted

## 2016-03-26 ENCOUNTER — Encounter
Admission: RE | Disposition: A | Discharge status: Skilled Nursing Facility | Payer: Self-pay | Source: Ambulatory Visit | Attending: Internal Medicine

## 2016-03-26 ENCOUNTER — Inpatient Hospital Stay: Payer: Medicare Other

## 2016-03-26 DIAGNOSIS — T82858A Stenosis of vascular prosthetic devices, implants and grafts, initial encounter: Secondary | ICD-10-CM | POA: Diagnosis present

## 2016-03-26 DIAGNOSIS — E1122 Type 2 diabetes mellitus with diabetic chronic kidney disease: Secondary | ICD-10-CM | POA: Diagnosis present

## 2016-03-26 DIAGNOSIS — E785 Hyperlipidemia, unspecified: Secondary | ICD-10-CM | POA: Diagnosis present

## 2016-03-26 DIAGNOSIS — Z7902 Long term (current) use of antithrombotics/antiplatelets: Secondary | ICD-10-CM | POA: Diagnosis not present

## 2016-03-26 DIAGNOSIS — R609 Edema, unspecified: Secondary | ICD-10-CM

## 2016-03-26 DIAGNOSIS — E875 Hyperkalemia: Principal | ICD-10-CM | POA: Diagnosis present

## 2016-03-26 DIAGNOSIS — Z8249 Family history of ischemic heart disease and other diseases of the circulatory system: Secondary | ICD-10-CM | POA: Diagnosis not present

## 2016-03-26 DIAGNOSIS — E119 Type 2 diabetes mellitus without complications: Secondary | ICD-10-CM | POA: Diagnosis not present

## 2016-03-26 DIAGNOSIS — I120 Hypertensive chronic kidney disease with stage 5 chronic kidney disease or end stage renal disease: Secondary | ICD-10-CM | POA: Diagnosis present

## 2016-03-26 DIAGNOSIS — Z992 Dependence on renal dialysis: Secondary | ICD-10-CM | POA: Diagnosis not present

## 2016-03-26 DIAGNOSIS — N2581 Secondary hyperparathyroidism of renal origin: Secondary | ICD-10-CM | POA: Diagnosis present

## 2016-03-26 DIAGNOSIS — I2510 Atherosclerotic heart disease of native coronary artery without angina pectoris: Secondary | ICD-10-CM | POA: Diagnosis not present

## 2016-03-26 DIAGNOSIS — T82838A Hemorrhage of vascular prosthetic devices, implants and grafts, initial encounter: Secondary | ICD-10-CM | POA: Diagnosis not present

## 2016-03-26 DIAGNOSIS — Z8673 Personal history of transient ischemic attack (TIA), and cerebral infarction without residual deficits: Secondary | ICD-10-CM

## 2016-03-26 DIAGNOSIS — Y712 Prosthetic and other implants, materials and accessory cardiovascular devices associated with adverse incidents: Secondary | ICD-10-CM | POA: Diagnosis present

## 2016-03-26 DIAGNOSIS — E1140 Type 2 diabetes mellitus with diabetic neuropathy, unspecified: Secondary | ICD-10-CM | POA: Diagnosis present

## 2016-03-26 DIAGNOSIS — E1151 Type 2 diabetes mellitus with diabetic peripheral angiopathy without gangrene: Secondary | ICD-10-CM | POA: Diagnosis present

## 2016-03-26 DIAGNOSIS — Z23 Encounter for immunization: Secondary | ICD-10-CM

## 2016-03-26 DIAGNOSIS — F0390 Unspecified dementia without behavioral disturbance: Secondary | ICD-10-CM | POA: Diagnosis present

## 2016-03-26 DIAGNOSIS — Z7982 Long term (current) use of aspirin: Secondary | ICD-10-CM

## 2016-03-26 DIAGNOSIS — T82856A Stenosis of peripheral vascular stent, initial encounter: Secondary | ICD-10-CM | POA: Diagnosis present

## 2016-03-26 DIAGNOSIS — D631 Anemia in chronic kidney disease: Secondary | ICD-10-CM | POA: Diagnosis present

## 2016-03-26 DIAGNOSIS — N186 End stage renal disease: Secondary | ICD-10-CM | POA: Diagnosis not present

## 2016-03-26 DIAGNOSIS — I10 Essential (primary) hypertension: Secondary | ICD-10-CM

## 2016-03-26 DIAGNOSIS — Z89421 Acquired absence of other right toe(s): Secondary | ICD-10-CM

## 2016-03-26 HISTORY — DX: Hyperparathyroidism, unspecified: E21.3

## 2016-03-26 HISTORY — DX: Anemia, unspecified: D64.9

## 2016-03-26 HISTORY — PX: PERIPHERAL VASCULAR CATHETERIZATION: SHX172C

## 2016-03-26 HISTORY — DX: Unspecified dementia, unspecified severity, without behavioral disturbance, psychotic disturbance, mood disturbance, and anxiety: F03.90

## 2016-03-26 HISTORY — DX: Peripheral vascular disease, unspecified: I73.9

## 2016-03-26 LAB — BASIC METABOLIC PANEL
ANION GAP: 12 (ref 5–15)
BUN: 126 mg/dL — ABNORMAL HIGH (ref 6–20)
CALCIUM: 9.6 mg/dL (ref 8.9–10.3)
CHLORIDE: 112 mmol/L — AB (ref 101–111)
CO2: 19 mmol/L — AB (ref 22–32)
Creatinine, Ser: 10.77 mg/dL — ABNORMAL HIGH (ref 0.44–1.00)
GFR calc non Af Amer: 3 mL/min — ABNORMAL LOW (ref >60)
GFR, EST AFRICAN AMERICAN: 4 mL/min — AB (ref >60)
GLUCOSE: 53 mg/dL — AB (ref 65–99)
POTASSIUM: 6.2 mmol/L — AB (ref 3.5–5.1)
Sodium: 143 mmol/L (ref 135–145)

## 2016-03-26 LAB — CBC
HEMATOCRIT: 26.7 % — AB (ref 35.0–47.0)
HEMOGLOBIN: 8.9 g/dL — AB (ref 12.0–16.0)
MCH: 32.2 pg (ref 26.0–34.0)
MCHC: 33.4 g/dL (ref 32.0–36.0)
MCV: 96.4 fL (ref 80.0–100.0)
Platelets: 166 10*3/uL (ref 150–440)
RBC: 2.76 MIL/uL — AB (ref 3.80–5.20)
RDW: 14.1 % (ref 11.5–14.5)
WBC: 4.8 10*3/uL (ref 3.6–11.0)

## 2016-03-26 LAB — POTASSIUM
POTASSIUM: 5 mmol/L (ref 3.5–5.1)
Potassium: 7.2 mmol/L (ref 3.5–5.1)

## 2016-03-26 LAB — GLUCOSE, CAPILLARY
GLUCOSE-CAPILLARY: 164 mg/dL — AB (ref 65–99)
Glucose-Capillary: 67 mg/dL (ref 65–99)
Glucose-Capillary: 91 mg/dL (ref 65–99)
Glucose-Capillary: 53 mg/dL — ABNORMAL LOW (ref 65–99)
Glucose-Capillary: 103 mg/dL — ABNORMAL HIGH (ref 65–99)
Glucose-Capillary: 66 mg/dL (ref 65–99)

## 2016-03-26 SURGERY — DIALYSIS/PERMA CATHETER INSERTION
Site: Arm Upper | Laterality: Left

## 2016-03-26 MED ORDER — ONDANSETRON HCL 4 MG/2ML IJ SOLN: 4.0000 mg | Freq: Four times a day (QID) | INTRAMUSCULAR | Status: DC | PRN

## 2016-03-26 MED ORDER — DEXTROSE 50 % IV SOLN
INTRAVENOUS | Status: AC
  Filled 2016-03-26: qty 50

## 2016-03-26 MED ORDER — DEXTROSE 50 % IV SOLN
1.0000 | Freq: Once | INTRAVENOUS | Status: AC
  Administered 2016-03-26: 50 mL via INTRAVENOUS

## 2016-03-26 MED ORDER — METHYLPREDNISOLONE SODIUM SUCC 125 MG IJ SOLR: 125.0000 mg | INTRAMUSCULAR | Status: DC | PRN

## 2016-03-26 MED ORDER — METOPROLOL TARTRATE 12.5 MG HALF TABLET
12.5000 mg | ORAL_TABLET | Freq: Two times a day (BID) | ORAL | Status: DC
  Administered 2016-03-27: 12.5 mg via ORAL
  Filled 2016-03-26 (×4): qty 1

## 2016-03-26 MED ORDER — VENLAFAXINE HCL ER 37.5 MG PO CP24
37.5000 mg | ORAL_CAPSULE | Freq: Every day | ORAL | Status: DC
  Administered 2016-03-27 – 2016-03-28 (×2): 37.5 mg via ORAL
  Filled 2016-03-26 (×2): qty 1

## 2016-03-26 MED ORDER — FENTANYL CITRATE (PF) 100 MCG/2ML IJ SOLN
INTRAMUSCULAR | Status: AC
  Filled 2016-03-26: qty 2

## 2016-03-26 MED ORDER — NEPRO/CARBSTEADY PO LIQD
237.0000 mL | Freq: Three times a day (TID) | ORAL | Status: DC
  Administered 2016-03-27 – 2016-03-28 (×2): 237 mL via ORAL

## 2016-03-26 MED ORDER — HYDROCODONE-ACETAMINOPHEN 5-325 MG PO TABS
1.0000 | ORAL_TABLET | ORAL | Status: DC | PRN
  Administered 2016-03-27: 1 via ORAL
  Filled 2016-03-26: qty 1

## 2016-03-26 MED ORDER — LATANOPROST 0.005 % OP SOLN
1.0000 [drp] | Freq: Every day | OPHTHALMIC | Status: DC
  Administered 2016-03-26 – 2016-03-27 (×2): 1 [drp] via OPHTHALMIC
  Filled 2016-03-26: qty 2.5

## 2016-03-26 MED ORDER — EPOETIN ALFA 4000 UNIT/ML IJ SOLN
4000.0000 [IU] | Freq: Once | INTRAMUSCULAR | Status: DC
  Filled 2016-03-26: qty 1

## 2016-03-26 MED ORDER — SODIUM CHLORIDE 0.9% FLUSH
3.0000 mL | Freq: Two times a day (BID) | INTRAVENOUS | Status: DC
  Administered 2016-03-26 – 2016-03-28 (×4): 3 mL via INTRAVENOUS

## 2016-03-26 MED ORDER — FAMOTIDINE 20 MG PO TABS: 40.0000 mg | ORAL_TABLET | ORAL | Status: DC | PRN

## 2016-03-26 MED ORDER — CINACALCET HCL 30 MG PO TABS
90.0000 mg | ORAL_TABLET | Freq: Every day | ORAL | Status: DC
  Administered 2016-03-27: 90 mg via ORAL
  Filled 2016-03-26: qty 3

## 2016-03-26 MED ORDER — ATORVASTATIN CALCIUM 20 MG PO TABS
20.0000 mg | ORAL_TABLET | Freq: Every day | ORAL | Status: DC
  Administered 2016-03-27: 20 mg via ORAL
  Filled 2016-03-26: qty 1

## 2016-03-26 MED ORDER — HYDROMORPHONE HCL 1 MG/ML IJ SOLN: 1.0000 mg | Freq: Once | INTRAMUSCULAR | Status: DC

## 2016-03-26 MED ORDER — DOCUSATE SODIUM 100 MG PO CAPS
100.0000 mg | ORAL_CAPSULE | Freq: Two times a day (BID) | ORAL | Status: DC
  Administered 2016-03-27 (×2): 100 mg via ORAL
  Filled 2016-03-26 (×3): qty 1

## 2016-03-26 MED ORDER — LISINOPRIL 10 MG PO TABS: 20.0000 mg | ORAL_TABLET | Freq: Two times a day (BID) | ORAL | Status: DC

## 2016-03-26 MED ORDER — ASPIRIN EC 81 MG PO TBEC
81.0000 mg | DELAYED_RELEASE_TABLET | Freq: Every day | ORAL | Status: DC
  Administered 2016-03-27 – 2016-03-28 (×2): 81 mg via ORAL
  Filled 2016-03-26 (×2): qty 1

## 2016-03-26 MED ORDER — DEXTROSE 50 % IV SOLN
INTRAVENOUS | Status: AC
  Administered 2016-03-26: 50 mL via INTRAVENOUS
  Filled 2016-03-26: qty 50

## 2016-03-26 MED ORDER — SENNA 8.6 MG PO TABS
1.0000 | ORAL_TABLET | Freq: Two times a day (BID) | ORAL | Status: DC
Start: 2016-03-26 — End: 2016-03-28
  Administered 2016-03-27 – 2016-03-28 (×3): 8.6 mg via ORAL
  Filled 2016-03-26 (×4): qty 1

## 2016-03-26 MED ORDER — HEPARIN SODIUM (PORCINE) 1000 UNIT/ML IJ SOLN
INTRAMUSCULAR | Status: AC
  Filled 2016-03-26: qty 1

## 2016-03-26 MED ORDER — DEXTROSE 50 % IV SOLN
INTRAVENOUS | Status: AC
  Administered 2016-03-26: 25 mL via INTRAVENOUS
  Filled 2016-03-26: qty 50

## 2016-03-26 MED ORDER — DEXTROSE 50 % IV SOLN
1.0000 | Freq: Once | INTRAVENOUS | Status: AC
Start: 2016-03-26 — End: 2016-03-26
  Administered 2016-03-26: 50 mL via INTRAVENOUS

## 2016-03-26 MED ORDER — DEXTROSE 50 % IV SOLN
25.0000 mL | Freq: Once | INTRAVENOUS | Status: AC
  Administered 2016-03-26: 25 mL via INTRAVENOUS

## 2016-03-26 MED ORDER — INSULIN ASPART 100 UNIT/ML ~~LOC~~ SOLN: 0.0000 [IU] | Freq: Three times a day (TID) | SUBCUTANEOUS | Status: DC

## 2016-03-26 MED ORDER — MIDAZOLAM HCL 5 MG/5ML IJ SOLN
INTRAMUSCULAR | Status: AC
  Filled 2016-03-26: qty 5

## 2016-03-26 MED ORDER — ENOXAPARIN SODIUM 30 MG/0.3ML ~~LOC~~ SOLN
30.0000 mg | SUBCUTANEOUS | Status: DC
  Administered 2016-03-27 – 2016-03-28 (×2): 30 mg via SUBCUTANEOUS
  Filled 2016-03-26 (×2): qty 0.3

## 2016-03-26 MED ORDER — SODIUM POLYSTYRENE SULFONATE 15 GM/60ML PO SUSP
30.0000 g | Freq: Once | ORAL | Status: AC
  Administered 2016-03-26: 30 g via ORAL
  Filled 2016-03-26: qty 120

## 2016-03-26 MED ORDER — CLOPIDOGREL BISULFATE 75 MG PO TABS
75.0000 mg | ORAL_TABLET | Freq: Every day | ORAL | Status: DC
  Administered 2016-03-27 – 2016-03-28 (×2): 75 mg via ORAL
  Filled 2016-03-26 (×2): qty 1

## 2016-03-26 MED ORDER — SEVELAMER CARBONATE 800 MG PO TABS
1600.0000 mg | ORAL_TABLET | Freq: Three times a day (TID) | ORAL | Status: DC
  Administered 2016-03-27 – 2016-03-28 (×4): 1600 mg via ORAL
  Filled 2016-03-26 (×4): qty 2

## 2016-03-26 MED ORDER — DEXTROSE 50 % IV SOLN: 1.0000 | Freq: Once | INTRAVENOUS | Status: DC

## 2016-03-26 MED ORDER — AMLODIPINE BESYLATE 5 MG PO TABS
2.5000 mg | ORAL_TABLET | Freq: Every day | ORAL | Status: DC
  Administered 2016-03-27: 2.5 mg via ORAL
  Filled 2016-03-26: qty 1

## 2016-03-26 MED ORDER — SODIUM CHLORIDE 0.9 % IV SOLN
INTRAVENOUS | Status: DC
  Administered 2016-03-26: 12:00:00 via INTRAVENOUS

## 2016-03-26 MED ORDER — POLYETHYLENE GLYCOL 3350 17 G PO PACK: 17.0000 g | PACK | Freq: Every day | ORAL | Status: DC | PRN

## 2016-03-26 MED ORDER — INSULIN REGULAR HUMAN 100 UNIT/ML IJ SOLN
10.0000 [IU] | Freq: Once | INTRAMUSCULAR | Status: AC
  Administered 2016-03-26: 10 [IU] via INTRAVENOUS
  Filled 2016-03-26: qty 0.1

## 2016-03-26 MED ORDER — LABETALOL HCL 5 MG/ML IV SOLN
10.0000 mg | INTRAVENOUS | Status: DC | PRN
  Administered 2016-03-26 – 2016-03-27 (×2): 10 mg via INTRAVENOUS
  Filled 2016-03-26 (×2): qty 4

## 2016-03-26 MED ORDER — SODIUM CHLORIDE 0.9 % IV SOLN
1.0000 g | Freq: Once | INTRAVENOUS | Status: AC
  Administered 2016-03-26: 1 g via INTRAVENOUS
  Filled 2016-03-26: qty 10

## 2016-03-26 MED ORDER — LIDOCAINE HCL (PF) 1 % IJ SOLN
INTRAMUSCULAR | Status: DC | PRN
  Administered 2016-03-26: 10 mL

## 2016-03-26 MED ORDER — MUPIROCIN 2 % EX OINT
TOPICAL_OINTMENT | Freq: Every day | CUTANEOUS | Status: DC
Start: 2016-03-26 — End: 2016-03-28
  Administered 2016-03-27 – 2016-03-28 (×2): via NASAL
  Filled 2016-03-26: qty 22

## 2016-03-26 SURGICAL SUPPLY — 1 items: KIT DIALYSIS CATH TRI 30X13 (CATHETERS) ×4 IMPLANT

## 2016-03-26 NOTE — Op Note (Signed)
  OPERATIVE NOTE   PROCEDURE: 1. Insertion of temporary dialysis catheter catheter right femoral approach.  PRE-OPERATIVE DIAGNOSIS: Hyperkalemia, complication of vascular access, and stage renal disease  POST-OPERATIVE DIAGNOSIS: Same  SURGEON: Renford DillsGregory G Drake M.D.  ANESTHESIA: 1% lidocaine local infiltration  ESTIMATED BLOOD LOSS: Minimal cc  INDICATIONS:   Tracy Drake is a 70 y.o. female who presents with dysfunction of her arm access.  On arrival potassium was measured and found to be 7.4. I am therefore not able to do thrombectomy and intervention and we'll place a temporary catheter so that she can receive emergent dialysis and correction of her life-threatening hyperkalemia.  DESCRIPTION: After obtaining full informed written consent, the patient was positioned supine. The right groin was prepped and draped in a sterile fashion. Ultrasound was placed in a sterile sleeve. Ultrasound was utilized to identify the right femoral vein which is noted to be echolucent and compressible indicating patency. Images recorded for the permanent record. Under real-time visualization a Seldinger needle is inserted into the vein and the guidewires advanced without difficulty. Small counterincision was made at the wire insertion site. Dilator is passed over the wire and the temporary dialysis catheter catheter is fed over the wire without difficulty.  All lumens aspirate and flush easily and are packed with heparin saline. Catheter secured to the skin of the right thigh with 2-0 silk. A sterile dressing is applied with Biopatch.  COMPLICATIONS: None  CONDITION: Unchanged  Tracy Drake Office:  [phone] 03/26/2016, 4:19 PM

## 2016-03-26 NOTE — Progress Notes (Signed)
Pre dialysis assessment 

## 2016-03-26 NOTE — Discharge Instructions (Signed)
Fistulogram, Care After °Introduction °Refer to this sheet in the next few weeks. These instructions provide you with information on caring for yourself after your procedure. Your health care provider may also give you more specific instructions. Your treatment has been planned according to current medical practices, but problems sometimes occur. Call your health care provider if you have any problems or questions after your procedure. °What can I expect after the procedure? °After your procedure, it is typical to have the following: °· A small amount of discomfort in the area where the catheters were placed. °· A small amount of bruising around the fistula. °· Sleepiness and fatigue. °Follow these instructions at home: °· Rest at home for the day following your procedure. °· Do not drive or operate heavy machinery while taking pain medicine. °· Take medicines only as directed by your health care provider. °· Do not take baths, swim, or use a hot tub until your health care provider approves. You may shower 24 hours after the procedure or as directed by your health care provider. °· There are many different ways to close and cover an incision, including stitches, skin glue, and adhesive strips. Follow your health care provider's instructions on: °¨ Incision care. °¨ Bandage (dressing) changes and removal. °¨ Incision closure removal. °· Monitor your dialysis fistula carefully. °Contact a health care provider if: °· You have drainage, redness, swelling, or pain at your catheter site. °· You have a fever. °· You have chills. °Get help right away if: °· You feel weak. °· You have trouble balancing. °· You have trouble moving your arms or legs. °· You have problems with your speech or vision. °· You can no longer feel a vibration or buzz when you put your fingers over your dialysis fistula. °· The limb that was used for the procedure: °¨ Swells. °¨ Is painful. °¨ Is cold. °¨ Is discolored, such as blue or pale  white. °This information is not intended to replace advice given to you by your health care provider. Make sure you discuss any questions you have with your health care provider. °Document Released: 08/15/2013 Document Revised: 09/06/2015 Document Reviewed: 05/20/2013 °© 2017 Elsevier ° °

## 2016-03-26 NOTE — Progress Notes (Signed)
HD initiated without issue via R Fem Cath.

## 2016-03-26 NOTE — Progress Notes (Signed)
Pre Dialysis 

## 2016-03-26 NOTE — H&P (Signed)
[facility] [place] [facility]   PATIENT NAME: Tracy Drake    MR#:  [id number]  DATE OF BIRTH:  07/20/1945  DATE OF ADMISSION:  03/26/2016  PRIMARY CARE PHYSICIAN: Pcp Not In System   REQUESTING/REFERRING PHYSICIAN: Dr.Dew  CHIEF COMPLAINT:  hyperkalemia  HISTORY OF PRESENT ILLNESS: Tracy Drake  is a 70 y.o. female with a known history of diabetes, end-stage renal disease on hemodialysis, hypertension, history of stroke, hyperlipidemia, peripheral vascular disease, hyperparathyroidism- lives in a nursing home and has a state appointed power of attorney. When she went for her hemodialysis Saturday they could not access her fistula and there was no thrill or sounds on the fistula. They referred her to Dr. Driscilla Grammesew's consultation, she was called in today for declotting procedure of her fistula. In preoperative checkup her potassium was noted 7.2- so they put a temporary dialysis catheter and called into medical consult for further management. I ordered interim orders for emergent management of hyperkalemia and called nephrologist to get her urgent hemodialysis.  PAST MEDICAL HISTORY:   Past Medical History:  Diagnosis Date  . Anemia   . Dementia   . Diabetes mellitus (HCC)    a. not on medications  . Diabetic neuropathy (HCC)   . ESRD (end stage renal disease) on dialysis (HCC)    on dialysis  . Essential hypertension   . History of stroke   . HLD (hyperlipidemia)   . Hyperparathyroidism (HCC)   . Peripheral vascular disease (HCC)     PAST SURGICAL HISTORY: Past Surgical History:  Procedure Laterality Date  . AMPUTATION TOE Right 11/02/2015   Procedure: AMPUTATION TOE;  Surgeon: Gwyneth RevelsJustin Fowler, DPM;  Location: [facility] ORS;  Service: Podiatry;  Laterality: Right;  . PERIPHERAL VASCULAR CATHETERIZATION Right 10/24/2015   Procedure: Lower Extremity Angiography;  Surgeon: Annice NeedyJason S Dew, MD;  Location: [facility] INVASIVE CV LAB;  Service: Cardiovascular;  Laterality: Right;   . PERIPHERAL VASCULAR CATHETERIZATION Right 11/01/2015   Procedure: Lower Extremity Angiography;  Surgeon: Annice NeedyJason S Dew, MD;  Location: [facility] INV[facility]IVE CV LAB;  Service: Cardiovascular;  Laterality: Right;  . PERIPHERAL VASCULAR CATHETERIZATION  11/01/2015   Procedure: Lower Extremity Intervention;  Surgeon: Annice NeedyJason S Dew, MD;  Location: [facility] INV[facility]IVE CV LAB;  Service: Cardiovascular;;  . PERIPHERAL VASCULAR CATHETERIZATION N/A 11/06/2015   Procedure: A/V Shuntogram/Fistulagram;  Surgeon: Renford DillsGregory G Schnier, MD;  Location: [facility] INV[facility] CV LAB;  Service: Cardiovascular;  Laterality: N/A;  . PERIPHERAL VASCULAR CATHETERIZATION N/A 11/06/2015   Procedure: A/V Shunt Intervention;  Surgeon: Renford DillsGregory G Schnier, MD;  Location: [facility] INV[facility] CV [facility];  Service: Cardiovascular;  Laterality: N/A;  . PERIPHERAL VASCULAR CATHETERIZATION N/A 11/29/2015   Procedure: Dialysis/Perma Catheter Removal;  Surgeon: Annice NeedyJason S Dew, MD;  Location: [facility] INVASIVE CV LAB;  Service: Cardiovascular;  Laterality: N/A;    SOCIAL HISTORY:  Social History  Substance Use Topics  . Smoking status: Never Smoker  . Smokeless tobacco: Never Used  . Alcohol use No    FAMILY HISTORY:  Family History  Problem Relation Age of Onset  . Hypertension Mother     DRUG ALLERGIES: No Known Allergies  REVIEW OF SYSTEMS:   CONSTITUTIONAL: No fever, fatigue or weakness.  EYES: No blurred or double vision.  EARS, NOSE, AND THROAT: No tinnitus or ear pain.  RESPIRATORY: No cough, shortness of breath, wheezing or hemoptysis.  CARDIOVASCULAR: No chest pain, orthopnea, edema.  GASTROINTESTINAL: No nausea, vomiting, diarrhea or abdominal pain.  GENITOURINARY: No dysuria, hematuria.  ENDOCRINE: No polyuria,  nocturia,  HEMATOLOGY: No anemia, easy bruising or bleeding SKIN: No rash or lesion. MUSCULOSKELETAL: No joint pain or arthritis.   NEUROLOGIC: No tingling, numbness, weakness.  PSYCHIATRY: No anxiety or depression.   MEDICATIONS AT HOME:   Prior to Admission medications   Medication Sig Start Date End Date Taking? Authorizing Provider  amLODipine (NORVASC) 2.5 MG tablet Take 1 tablet (2.5 mg total) by mouth daily. 11/07/15  Yes Katharina Caper, MD  aspirin EC 81 MG tablet Take 81 mg by mouth daily.   Yes Historical Provider, MD  atorvastatin (LIPITOR) 20 MG tablet Take 20 mg by mouth daily at 6 PM.   Yes Historical Provider, MD  cinacalcet (SENSIPAR) 90 MG tablet Take 90 mg by mouth daily with supper.   Yes Historical Provider, MD  clopidogrel (PLAVIX) 75 MG tablet Take 1 tablet (75 mg total) by mouth daily. 11/03/15  Yes Katharina Caper, MD  docusate sodium (COLACE) 100 MG capsule Take 1 capsule (100 mg total) by mouth 2 (two) times daily. 11/03/15  Yes Katharina Caper, MD  HYDROcodone-acetaminophen (NORCO/VICODIN) 5-325 MG tablet Take 1-2 tablets by mouth every 4 (four) hours as needed for moderate pain or severe pain. 11/07/15  Yes Katharina Caper, MD  latanoprost (XALATAN) 0.005 % ophthalmic solution 1 drop at bedtime.   Yes Historical Provider, MD  lisinopril (PRINIVIL,ZESTRIL) 20 MG tablet Take 1 tablet (20 mg total) by mouth 2 (two) times daily. Patient taking differently: Take 40 mg by mouth 2 (two) times daily.  11/07/15  Yes Katharina Caper, MD  metoprolol tartrate (LOPRESSOR) 25 MG tablet Take 0.5 tablets (12.5 mg total) by mouth 2 (two) times daily. 11/03/15  Yes Katharina Caper, MD  polyethylene glycol (MIRALAX / GLYCOLAX) packet Take 17 g by mouth daily as needed for mild constipation. 11/03/15  Yes Katharina Caper, MD  senna (SENOKOT) 8.6 MG TABS tablet Take 1 tablet (8.6 mg total) by mouth 2 (two) times daily. 11/03/15  Yes Katharina Caper, MD  sevelamer carbonate (RENVELA) 800 MG tablet Take 1,600 mg by mouth 3 (three) times daily with meals.   Yes Historical Provider, MD  venlafaxine XR (EFFEXOR-XR) 37.5 MG 24 hr capsule Take 37.5 mg by mouth daily with breakfast.   Yes Historical Provider, MD  mupirocin ointment (BACTROBAN) 2 % Place  into the nose daily. Patient not taking: Reported on 03/26/2016 11/03/15   Katharina Caper, MD  Nutritional Supplements (FEEDING SUPPLEMENT, NEPRO CARB STEADY,) LIQD Take 237 mLs by mouth 3 (three) times daily between meals. 11/07/15   Katharina Caper, MD      PHYSICAL EXAMINATION:   VITAL SIGNS: Blood pressure (!) 200/81, pulse 86, temperature 97.7 F (36.5 C), temperature source Axillary, resp. rate 18, height 5\' 2"  (1.575 m), weight 61.7 kg (136 lb), SpO2 98 %.  GENERAL:  70 y.o.-year-old patient lying in the bed with no acute distress.  EYES: Pupils equal, round, reactive to light and accommodation. No scleral icterus. Extraocular muscles intact.  HEENT: Head atraumatic, normocephalic. Oropharynx and nasopharynx clear.  NECK:  Supple, no jugular venous distention. No thyroid enlargement, no tenderness.  LUNGS: Normal breath sounds bilaterally, no wheezing, rales,rhonchi or crepitation. No use of accessory muscles of respiration.  CARDIOVASCULAR: S1, S2 normal. No murmurs, rubs, or gallops.  ABDOMEN: Soft, nontender, nondistended. Bowel sounds present. No organomegaly or mass.  EXTREMITIES: No pedal edema, cyanosis, or clubbing. Left arm AV fistula present NEUROLOGIC: Cranial nerves II through XII are intact. Muscle strength 5/5 in all extremities. Sensation intact. Gait not checked.  PSYCHIATRIC: The patient is alert and oriented x 3.  SKIN: No obvious rash, lesion, or ulcer.   LABORATORY PANEL:   CBC No results for input(s): WBC, HGB, HCT, PLT, MCV, MCH, MCHC, RDW, LYMPHSABS, MONOABS, EOSABS, BASOSABS, BANDABS in the last 168 hours.  Invalid input(s): NEUTRABS, BANDSABD ------------------------------------------------------------------------------------------------------------------  Chemistries   Recent Labs Lab 03/26/16 0955  K 7.2*   ------------------------------------------------------------------------------------------------------------------ CrCl cannot be calculated  (Patient's most recent lab result is older than the maximum 21 days allowed.). ------------------------------------------------------------------------------------------------------------------ No results for input(s): TSH, T4TOTAL, T3FREE, THYROIDAB in the last 72 hours.  Invalid input(s): FREET3   Coagulation profile No results for input(s): INR, PROTIME in the last 168 hours. ------------------------------------------------------------------------------------------------------------------- No results for input(s): DDIMER in the last 72 hours. -------------------------------------------------------------------------------------------------------------------  Cardiac Enzymes No results for input(s): CKMB, TROPONINI, MYOGLOBIN in the last 168 hours.  Invalid input(s): CK ------------------------------------------------------------------------------------------------------------------ Invalid input(s): POCBNP  ---------------------------------------------------------------------------------------------------------------  Urinalysis No results found for: COLORURINE, APPEARANCEUR, LABSPEC, PHURINE, GLUCOSEU, HGBUR, BILIRUBINUR, KETONESUR, PROTEINUR, UROBILINOGEN, NITRITE, LEUKOCYTESUR   RADIOLOGY: No results found.  EKG: Orders placed or performed during the hospital encounter of 10/22/15  . ED EKG  . ED EKG  . EKG 12-Lead  . EKG 12-Lead    IMPRESSION AND PLAN:  * Hyperkalemia   End-stage renal disease on hemodialysis   malfunctioning of hemodialysis access    Ordered for IV calcium gluconate, IV insulin, D50 injection, oral Kayexalate and hourly fingerstick check for next 4 hours.   Also spoke to nephrologist for urgent hemodialysis need for today.   Vascular surgery placed a temporary hemodialysis catheter for now, once potassium is stabilized they will address the declotting of AV fistula.   Also sent blood culture to check for infection as a cause of her fistula  clotting.  * Hypertension   Continue home medications.  * History of stroke   Continue aspirin and Plavix and blood pressure control.  * History of diabetes   Patient is not on any antidiabetic medication, will keep on sliding scale insulin coverage.  All the records are reviewed and case discussed with ED provider. Management plans discussed with the patient, family and they are in agreement.  CODE STATUS: DO NOT RESUSCITATE as per old records.    Code Status Orders        Start     Ordered   03/26/16 1152  Full code  Continuous     03/26/16 1151    Code Status History    Date Active Date Inactive Code Status Order ID Comments User Context   03/26/2016 11:46 AM 03/26/2016 11:51 AM DNR [id number]  Altamese DillingVaibhavkumar Caylor Tallarico, MD Inpatient   11/06/2015  4:51 PM 11/07/2015  9:20 PM DNR [id number]  Renford DillsGregory G Schnier, MD Inpatient   10/30/2015  3:47 PM 11/06/2015  4:51 PM DNR [id number]  Canary BrimMary W Larach, NP Inpatient   10/22/2015  8:48 PM 10/30/2015  3:47 PM Full Code [id number]  Katharina Caperima Vaickute, MD Inpatient       TOTAL TIME TAKING CARE OF THIS PATIENT: 50 critical care minutes.    Altamese DillingVACHHANI, Darin Arndt M.D on 03/26/2016   Between 7am to 6pm - Pager - [phone]  After 6pm go to [email or web address] - password [email or web address]  [facility]  Office  [phone]  CC: Primary care physician; Pcp Not In System   Note: This dictation was prepared with Dragon dictation along with smaller phrase technology. Any transcriptional errors that result from this process are unintentional.

## 2016-03-26 NOTE — Progress Notes (Signed)
HD completed without issue. All blood returned with 5 minutes remaining d/t system in the process of clotting d/t pt inability to keep access leg still with frequent machine stopping. Patient tolerated procedure well. UF [id number].

## 2016-03-26 NOTE — Progress Notes (Addendum)
03/26/2016 3:16 PM  Transferred to unit from special procedures accompanied by Revonda StandardAllison, RN. Report given at bedside. Patient lethargic and confused. BP (!) 180/58 (BP Location: Right Arm)   Pulse 78   Temp 98.2 F (36.8 C) (Oral)   Resp 16   Ht 5\' 2"  (1.575 m)   Wt 61.7 kg (136 lb)   SpO2 98%   BMI 24.87 kg/m  . Right groin trialysis catheter in place. Calcuim gluconate infusing into purple lumen via Revonda StandardAllison, RN from special procedures. Finger stick glucose 53. 1 amp dextrose 50% given per Dr. Larose HiresVachhani's orders. Glucose now 103. Patient resting comfortably awaiting to go to dialysis. Awaiting on DSS to call back for consent for dialysis to be obtained.  Ron ParkerHerron, Robbert Langlinais D, RN

## 2016-03-26 NOTE — Progress Notes (Signed)
Notified by nurse that they're not able to contact DSS for consent for hemodialysis. Due to potassium and BUN being critically high and the need to correct these parameters for potential procedure tomorrow or the day after, we will proceed with emergent hemodialysis without consent.

## 2016-03-26 NOTE — Progress Notes (Signed)
Post hd assessment 

## 2016-03-26 NOTE — H&P (Signed)
[facility] Admission History & Physical  MRN : [id number]  [name] is a 70 y.o. (11/01/1945) female who presents with chief complaint of No chief complaint on file. [name].  History of Present Illness: I am asked to evaluate the patient by the dialysis center. The patient was sent here because they were unable to cannulate her arm access this past Saturday. Furthermore the Center states there is no thrill or bruit. The patient states this is the first dialysis run to be missed. The patient is unaware of any other change.  Patient denies pain or tenderness overlying the access.  There is no pain with dialysis.  The patient denies hand pain or finger pain consistent with steal syndrome.   There have not been any past interventions or declots of this access.  The patient is not reported to be chronically hypotensive on dialysis.  Current Facility-Administered Medications  Medication Dose Route Frequency Provider Last Rate Last Dose  . 0.9 %  sodium chloride infusion   Intravenous Continuous Kimberly A Stegmayer, PA-C      . cefUROXime (ZINACEF) 1.5 g in dextrose 5 % 50 mL IVPB  1.5 g Intravenous 30 min Pre-Op Kimberly A Stegmayer, PA-C      . famotidine (PEPCID) tablet 40 mg  40 mg Oral PRN Ranae PlumberKimberly A Stegmayer, PA-C      . HYDROmorphone (DILAUDID) injection 1 mg  1 mg Intravenous Once BlueLinxKimberly A Stegmayer, PA-C      . methylPREDNISolone sodium succinate (SOLU-MEDROL) 125 mg/2 mL injection 125 mg  125 mg Intravenous PRN Kimberly A Stegmayer, PA-C      . ondansetron (ZOFRAN) injection 4 mg  4 mg Intravenous Q6H PRN Tonette LedererKimberly A Stegmayer, PA-C        Past Medical History:  Diagnosis Date  . Anemia   . Dementia   . Diabetes mellitus (HCC)    a. not on medications  . Diabetic neuropathy (HCC)   . ESRD (end stage renal disease) on dialysis (HCC)    on dialysis  . Essential hypertension   . History of stroke   . HLD (hyperlipidemia)   . Hyperparathyroidism  (HCC)   . Peripheral vascular disease [facility](HCC)     Past Surgical History:  Procedure Laterality Date  . AMPUTATION TOE Right 11/02/2015   Procedure: AMPUTATION TOE;  Surgeon: Gwyneth RevelsJustin Fowler, DPM;  Location: [facility] ORS;  Service: Podiatry;  Laterality: Right;  . PERIPHERAL VASCULAR CATHETERIZATION Right 10/24/2015   Procedure: Lower Extremity Angiography;  Surgeon: Annice NeedyJason S Dew, MD;  Location: [facility] INVASIVE CV [facility];  Service: Cardiovascular;  Laterality: Right;  . PERIPHERAL VASCULAR CATHETERIZATION Right 11/01/2015   Procedure: Lower Extremity Angiography;  Surgeon: Annice NeedyJason S Dew, MD;  Location: [facility] INV[facility] CV LAB;  Service: Cardiovascular;  Laterality: Right;  . PERIPHERAL VASCULAR CATHETERIZATION  11/01/2015   Procedure: Lower Extremity Intervention;  Surgeon: Annice NeedyJason S Dew, MD;  Location: [facility] INV[facility] CV [facility];  Service: Cardiovascular;;  . PERIPHERAL VASCULAR CATHETERIZATION N/A 11/06/2015   Procedure: A/V Shuntogram/Fistulagram;  Surgeon: Renford DillsGregory G Schnier, MD;  Location: [facility] INV[facility] CV [facility];  Service: Cardiovascular;  Laterality: N/A;  . PERIPHERAL VASCULAR CATHETERIZATION N/A 11/06/2015   Procedure: A/V Shunt Intervention;  Surgeon: Renford DillsGregory G Schnier, MD;  Location: [facility] INV[facility] CV [facility];  Service: Cardiovascular;  Laterality: N/A;  . PERIPHERAL VASCULAR CATHETERIZATION N/A 11/29/2015   Procedure: Dialysis/Perma Catheter Removal;  Surgeon: Annice NeedyJason S Dew, MD;  Location: [facility] INV[facility]IVE CV [facility];  Service: Cardiovascular;  Laterality: N/A;  Social History Social History  Substance Use Topics  . Smoking status: Never Smoker  . Smokeless tobacco: Never Used  . Alcohol use No    Family History Family History  Problem Relation Age of Onset  . Hypertension Mother    No family history of bleeding/clotting disorders, porphyria or autoimmune disease   No Known Allergies   REVIEW OF SYSTEMS (Negative unless checked)  Constitutional: [] Weight loss  [] Fever  [] Chills Cardiac: [] Chest pain   [] Chest  pressure   [] Palpitations   [] Shortness of breath when laying flat   [] Shortness of breath at rest   [x] Shortness of breath with exertion. Vascular:  [] Pain in legs with walking   [] Pain in legs at rest   [] Pain in legs when laying flat   [] Claudication   [] Pain in feet when walking  [] Pain in feet at rest  [] Pain in feet when laying flat   [] History of DVT   [] Phlebitis   [x] Swelling in legs   [] Varicose veins   [] Non-healing ulcers Pulmonary:   [] Uses home oxygen   [] Productive cough   [] Hemoptysis   [] Wheeze  [] COPD   [] Asthma Neurologic:  [] Dizziness  [] Blackouts   [] Seizures   [] History of stroke   [] History of TIA  [] Aphasia   [] Temporary blindness   [] Dysphagia   [] Weakness or numbness in arms   [] Weakness or numbness in legs Musculoskeletal:  [] Arthritis   [] Joint swelling   [x] Joint pain   [] Low back pain Hematologic:  [] Easy bruising  [] Easy bleeding   [] Hypercoagulable state   [] Anemic  [] Hepatitis Gastrointestinal:  [] Blood in stool   [] Vomiting blood  [] Gastroesophageal reflux/heartburn   [] Difficulty swallowing. Genitourinary:  [x] Chronic kidney disease   [] Difficult urination  [] Frequent urination  [] Burning with urination   [] Blood in urine Skin:  [] Rashes   [] Ulcers   [] Wounds Psychological:  [] History of anxiety   []  History of major depression.  Physical Examination  Vitals:   03/26/16 0913 03/26/16 0930  BP: (!) 200/81   Pulse: 86   Resp: 17 18  Temp: 97.7 F (36.5 C)   TempSrc: Axillary   SpO2: 98%   Weight: 61.7 kg (136 lb)   Height: 5\' 2"  (1.575 m)    Body mass index is 24.87 kg/m. Gen: WD/WN, NAD Head: [place]/AT, No temporalis wasting. Prominent temp pulse not noted. Ear/Nose/Throat: Hearing grossly intact, nares w/o erythema or drainage, oropharynx w/o Erythema/Exudate,  Eyes: Conjunctiva clear, sclera non-icteric Neck: Trachea midline.  No JVD.  Pulmonary:  Good air movement, respirations not labored, no use of accessory muscles.  Cardiac: RRR, normal S1,  S2. Vascular:  Soft thrill and weak bruit Vessel Right Left  Radial Palpable Palpable  Ulnar Palpable Palpable  Brachial Palpable Palpable  Carotid Palpable, without bruit Palpable, without bruit  Gastrointestinal: soft, non-tender/non-distended. No guarding/reflex.  Musculoskeletal: M/S 5/5 throughout.  Extremities without ischemic changes.  No deformity or atrophy.  Neurologic: Sensation grossly intact in extremities.  Symmetrical.  Speech is fluent. Motor exam as listed above. Psychiatric: Judgment intact, Mood & affect appropriate for pt's clinical situation. Dermatologic: No rashes or ulcers noted.  No cellulitis or open wounds. Lymph : No Cervical, Axillary, or Inguinal lymphadenopathy.   CBC Lab Results  Component Value Date   WBC 5.3 11/05/2015   HGB 10.9 (L) 11/05/2015   HCT 31.5 (L) 11/05/2015   MCV 94.0 11/05/2015   PLT 149 (L) 11/05/2015    BMET    Component Value Date/Time   NA 137 11/03/2015 1546  K 4.2 11/03/2015 1546   CL 98 (L) 11/03/2015 1546   CO2 30 11/03/2015 1546   GLUCOSE 118 (H) 11/03/2015 1546   BUN 25 (H) 11/03/2015 1546   CREATININE 4.73 (H) 11/03/2015 1546   CALCIUM 7.6 (L) 11/03/2015 1546   GFRNONAA 8 (L) 11/03/2015 1546   GFRAA 10 (L) 11/03/2015 1546   CrCl cannot be calculated (Patient's most recent lab result is older than the maximum 21 days allowed.).  COAG Lab Results  Component Value Date   INR 1.11 10/23/2015    Radiology No results found.  Assessment/Plan 1.  Complication dialysis device with thrombosis AV access:  Patient's dialysis access is likely thrombosed. The patient will undergo thrombectomy using interventional techniques. Potassium will be drawn to ensure that it is an appropriate level prior to performing thrombectomy. 2.  End-stage renal disease requiring hemodialysis:  Patient will continue dialysis therapy without further interruption if a successful thrombectomy is not achieved then catheter will be placed.  Dialysis has already been arranged since the patient missed their previous session 3.  Hypertension:  Patient will continue medical management; nephrology is following no changes in oral medications. 4. Diabetes mellitus:  Glucose will be monitored and oral medications been held this morning once the patient has undergone the patient's procedure po intake will be reinitiated and again Accu-Cheks will be used to assess the blood glucose level and treat as needed. The patient will be restarted on the patient's usual hypoglycemic regime 5.  Coronary artery disease:  EKG will be monitored. Nitrates will be used if needed. The patient's oral cardiac medications will be continued.    Levora DredgeGregory Schnier, MD  03/26/2016 10:15 AM

## 2016-03-26 NOTE — Progress Notes (Signed)
Subjective:  Patient was sent over from dialysis for evaluation of a clotted access area and however, upon arrival, patient doesn't have a bruit and thrill.  Her potassium has been elevated outpatienttherefore an angiogram is being contemplated Patient is somewhat confused and is not able to provide details about her dialysis treatment. preoperatively, labs show her potassium was critically elevated at 7.2 therefore the procedure was postponed for now. Urgent dialysis is being requested. Patient is being admitted to the hospital for further evaluation and management  Patient does not have any acute complaints at this time.  She is hungry and asking for food.   Objective:  Vital signs in last 24 hours:  Temp:  [97.7 F (36.5 C)-98.2 F (36.8 C)] 98.2 F (36.8 C) (12/13 1352) Pulse Rate:  [78-86] 78 (12/13 1352) Resp:  [9-18] 16 (12/13 1352) BP: (180-200)/(58-81) 180/58 (12/13 1352) SpO2:  [98 %] 98 % (12/13 1352) Weight:  [61.7 kg (136 lb)] 61.7 kg (136 lb) (12/13 0913)  Weight change:  Filed Weights   03/26/16 0913  Weight: 61.7 kg (136 lb)    Intake/Output:   No intake or output data in the 24 hours ending 03/26/16 1530   Physical Exam: General: No acute distress, laying in the bed  HEENT Anicteric, moist oral mucous membranes  Neck supple  Pulm/lungs Normal breathing effort, clear to auscultation  CVS/Heart No rub or gallop  Abdomen:  Soft, nontender  Extremities: No peripheral edema  Neurologic: alert, able to answer questions however disoriented, appears to be at her baseline mental status  Skin: No acute rashes  Access: Left upper arm AV fistula,bruit and thrill present       Basic Metabolic Panel:   Recent Labs Lab 03/26/16 0955 03/26/16 1407  NA  --  143  K 7.2* 6.2*  CL  --  112*  CO2  --  19*  GLUCOSE  --  53*  BUN  --  126*  CREATININE  --  10.77*  CALCIUM  --  9.6     CBC:  Recent Labs Lab 03/26/16 1407  WBC 4.8  HGB 8.9*  HCT 26.7*   MCV 96.4  PLT 166      Microbiology:  No results found for this or any previous visit (from the past 720 hour(s)).  Coagulation Studies: No results for input(s): LABPROT, INR in the last 72 hours.  Urinalysis: No results for input(s): COLORURINE, LABSPEC, PHURINE, GLUCOSEU, HGBUR, BILIRUBINUR, KETONESUR, PROTEINUR, UROBILINOGEN, NITRITE, LEUKOCYTESUR in the last 72 hours.  Invalid input(s): APPERANCEUR    Imaging: Dg Chest 1 View  Result Date: 03/26/2016 CLINICAL DATA:  Dialysis dependent renal failure, edema. EXAM: CHEST 1 VIEW COMPARISON:  Portable chest x-ray of October 22, 2015 FINDINGS: The lungs are well-expanded. The interstitial markings are mildly increased. The cardiac silhouette is enlarged. The central pulmonary vascularity is mildly prominent. There is tortuosity of the descending thoracic aorta. There is mural calcification within the aorta. A vascular graft is noted the left subclavian region. IMPRESSION: Mild pulmonary interstitial edema consistent with low-grade CHF. No pleural effusion or pneumonia. Thoracic aortic atherosclerosis. Electronically Signed   By: David  SwazilandJordan M.D.   On: 03/26/2016 12:25     Medications:    . amLODipine  2.5 mg Oral Daily  . aspirin EC  81 mg Oral Daily  . atorvastatin  20 mg Oral q1800  . cinacalcet  90 mg Oral Q supper  . clopidogrel  75 mg Oral Daily  . docusate sodium  100  mg Oral BID  . enoxaparin (LOVENOX) injection  30 mg Subcutaneous Q24H  . feeding supplement (NEPRO CARB STEADY)  237 mL Oral TID BM  . insulin aspart  0-9 Units Subcutaneous TID WC  . latanoprost  1 drop Both Eyes QHS  . metoprolol tartrate  12.5 mg Oral BID  . mupirocin ointment   Nasal Daily  . senna  1 tablet Oral BID  . sevelamer carbonate  1,600 mg Oral TID WC  . sodium chloride flush  3 mL Intravenous Q12H  . [START ON 03/27/2016] venlafaxine XR  37.5 mg Oral Q breakfast   HYDROcodone-acetaminophen, polyethylene glycol  Assessment/ Plan:  70  y.o.African American female with hypertension, diabetes mellitus type II, hyperlipidemia, thyroid disorder and ESRD on hemodialysis.    [facility] [address] [address]    1. End Stage Renal Disease with hyperkalemia on admission: Continue TTS schedule.  - emergent hemodialysis today for hyperkalemia - We will evaluate her for dialysis tomorrow if hypokalemia persists  2.  Anemia of chronic kidney disease:  -  hemoglobin 8.9 - Continue low-dose Procrit withdialysis  3. Secondary Hyperparathyroidism:  - monitor phosphorus during this hospital admission  4. Complication of dialysis access - poor clearance outpatient chronic hyperkalemia - Angiogram planned    LOS: 0 Carey Lafon 12/13/20173:30 PM

## 2016-03-27 LAB — CBC
HEMATOCRIT: 25.1 % — AB (ref 35.0–47.0)
Hemoglobin: 8.4 g/dL — ABNORMAL LOW (ref 12.0–16.0)
MCH: 32.4 pg (ref 26.0–34.0)
MCHC: 33.3 g/dL (ref 32.0–36.0)
MCV: 97.2 fL (ref 80.0–100.0)
Platelets: 110 10*3/uL — ABNORMAL LOW (ref 150–440)
RBC: 2.58 MIL/uL — ABNORMAL LOW (ref 3.80–5.20)
RDW: 13.8 % (ref 11.5–14.5)
WBC: 3.7 10*3/uL (ref 3.6–11.0)

## 2016-03-27 LAB — BASIC METABOLIC PANEL
Anion gap: 7 (ref 5–15)
BUN: 41 mg/dL — AB (ref 6–20)
CHLORIDE: 105 mmol/L (ref 101–111)
CO2: 30 mmol/L (ref 22–32)
Calcium: 9.1 mg/dL (ref 8.9–10.3)
Creatinine, Ser: 5.11 mg/dL — ABNORMAL HIGH (ref 0.44–1.00)
GFR calc Af Amer: 9 mL/min — ABNORMAL LOW (ref >60)
GFR calc non Af Amer: 8 mL/min — ABNORMAL LOW (ref >60)
GLUCOSE: 77 mg/dL (ref 65–99)
POTASSIUM: 3.9 mmol/L (ref 3.5–5.1)
Sodium: 142 mmol/L (ref 135–145)

## 2016-03-27 LAB — MRSA PCR SCREENING: MRSA by PCR: NEGATIVE

## 2016-03-27 LAB — GLUCOSE, CAPILLARY
GLUCOSE-CAPILLARY: 82 mg/dL (ref 65–99)
GLUCOSE-CAPILLARY: 79 mg/dL (ref 65–99)
Glucose-Capillary: 78 mg/dL (ref 65–99)
Glucose-Capillary: 116 mg/dL — ABNORMAL HIGH (ref 65–99)

## 2016-03-27 MED ORDER — AMLODIPINE BESYLATE 10 MG PO TABS
10.0000 mg | ORAL_TABLET | Freq: Every day | ORAL | Status: DC
  Administered 2016-03-28: 10 mg via ORAL
  Filled 2016-03-27: qty 1

## 2016-03-27 MED ORDER — METOPROLOL TARTRATE 25 MG PO TABS
25.0000 mg | ORAL_TABLET | Freq: Two times a day (BID) | ORAL | Status: DC
  Administered 2016-03-27 – 2016-03-28 (×2): 25 mg via ORAL
  Filled 2016-03-27 (×2): qty 1

## 2016-03-27 MED ORDER — PNEUMOCOCCAL VAC POLYVALENT 25 MCG/0.5ML IJ INJ
0.5000 mL | INJECTION | INTRAMUSCULAR | Status: AC
  Administered 2016-03-28: 0.5 mL via INTRAMUSCULAR
  Filled 2016-03-27: qty 0.5

## 2016-03-27 MED ORDER — INFLUENZA VAC SPLIT QUAD 0.5 ML IM SUSY
0.5000 mL | PREFILLED_SYRINGE | INTRAMUSCULAR | Status: AC
  Administered 2016-03-28: 0.5 mL via INTRAMUSCULAR
  Filled 2016-03-27: qty 0.5

## 2016-03-27 NOTE — Progress Notes (Signed)
Pre dialysis assessment 

## 2016-03-27 NOTE — Progress Notes (Signed)
Patient left recently for dialysis via bed by orderly

## 2016-03-27 NOTE — Progress Notes (Signed)
Dr dew gave okay for patient to be up while trialysis line is in her groin

## 2016-03-27 NOTE — Progress Notes (Signed)
HD initiated without issue via R Fem HD cath. Pt currently resting without complaint.

## 2016-03-27 NOTE — Progress Notes (Signed)
Dialysis completed without issue. 1L UF tolerated well

## 2016-03-27 NOTE — Progress Notes (Signed)
Subjective:  Patient was sent over from dialysis for evaluation of a clotted access area and however, upon arrival, patient doesn't have a bruit and thrill.  Her potassium has been elevated outpatienttherefore an angiogram is being contemplated Patient is somewhat confused and is not able to provide details about her dialysis treatment. preoperatively, labs show her potassium was critically elevated at 7.2 therefore the procedure was postponed for now.  Urgent dialysis done yesterday Potassium corrected No acute c/o today  Objective:  Vital signs in last 24 hours:  Temp:  [97.8 F (36.6 C)-98.2 F (36.8 C)] 98.1 F (36.7 C) (12/14 1202) Pulse Rate:  [74-97] 79 (12/14 1202) Resp:  [9-21] 18 (12/14 1202) BP: (144-199)/(56-83) 179/58 (12/14 1202) SpO2:  [98 %-100 %] 100 % (12/14 1202) Weight:  [61.1 kg (134 lb 11.2 oz)-61.9 kg (136 lb 7.4 oz)] 61.1 kg (134 lb 11.2 oz) (12/13 1952)  Weight change:  Filed Weights   03/26/16 0913 03/26/16 1616 03/26/16 1952  Weight: 61.7 kg (136 lb) 61.9 kg (136 lb 7.4 oz) 61.1 kg (134 lb 11.2 oz)    Intake/Output:    Intake/Output Summary (Last 24 hours) at 03/27/16 1255 Last data filed at 03/27/16 0858  Gross per 24 hour  Intake               10 ml  Output              893 ml  Net             -883 ml     Physical Exam: General: No acute distress, laying in the bed  HEENT Anicteric, moist oral mucous membranes  Neck supple  Pulm/lungs Normal breathing effort, clear to auscultation  CVS/Heart No rub or gallop  Abdomen:  Soft, nontender  Extremities: No peripheral edema  Neurologic: alert, able to answer questions however disoriented, appears to be at her baseline mental status  Skin: No acute rashes  Access: Left upper arm AV fistula,bruit and thrill present   Femoral temp cath    Basic Metabolic Panel:   Recent Labs Lab 03/26/16 0955 03/26/16 1407 03/26/16 1710 03/27/16 0500  NA  --  143  --  142  K 7.2* 6.2* 5.0 3.9  CL   --  112*  --  105  CO2  --  19*  --  30  GLUCOSE  --  53*  --  77  BUN  --  126*  --  41*  CREATININE  --  10.77*  --  5.11*  CALCIUM  --  9.6  --  9.1     CBC:  Recent Labs Lab 03/26/16 1407 03/27/16 0500  WBC 4.8 3.7  HGB 8.9* 8.4*  HCT 26.7* 25.1*  MCV 96.4 97.2  PLT 166 110*      Microbiology:  Recent Results (from the past 720 hour(s))  CULTURE, BLOOD (ROUTINE X 2) w Reflex to ID Panel     Status: None (Preliminary result)   Collection Time: 03/26/16  2:07 PM  Result Value Ref Range Status   Specimen Description BLOOD R HAND  Final   Special Requests BOTTLES DRAWN AEROBIC AND ANAEROBIC ANA 6 AER 8ML  Final   Culture NO GROWTH < 24 HOURS  Final   Report Status PENDING  Incomplete  CULTURE, BLOOD (ROUTINE X 2) w Reflex to ID Panel     Status: None (Preliminary result)   Collection Time: 03/26/16  2:16 PM  Result Value Ref Range Status  Specimen Description BLOOD  R WRIST  Final   Special Requests BOTTLES DRAWN AEROBIC AND ANAEROBIC  ANA 7 AER 6ML  Final   Culture NO GROWTH < 24 HOURS  Final   Report Status PENDING  Incomplete  MRSA PCR Screening     Status: None   Collection Time: 03/27/16  3:03 AM  Result Value Ref Range Status   MRSA by PCR NEGATIVE NEGATIVE Final    Comment:        The GeneXpert MRSA Assay (FDA approved for NASAL specimens only), is one component of a comprehensive MRSA colonization surveillance program. It is not intended to diagnose MRSA infection nor to guide or monitor treatment for MRSA infections.     Coagulation Studies: No results for input(s): LABPROT, INR in the last 72 hours.  Urinalysis: No results for input(s): COLORURINE, LABSPEC, PHURINE, GLUCOSEU, HGBUR, BILIRUBINUR, KETONESUR, PROTEINUR, UROBILINOGEN, NITRITE, LEUKOCYTESUR in the last 72 hours.  Invalid input(s): APPERANCEUR    Imaging: Dg Chest 1 View  Result Date: 03/26/2016 CLINICAL DATA:  Dialysis dependent renal failure, edema. EXAM: CHEST 1 VIEW  COMPARISON:  Portable chest x-ray of October 22, 2015 FINDINGS: The lungs are well-expanded. The interstitial markings are mildly increased. The cardiac silhouette is enlarged. The central pulmonary vascularity is mildly prominent. There is tortuosity of the descending thoracic aorta. There is mural calcification within the aorta. A vascular graft is noted the left subclavian region. IMPRESSION: Mild pulmonary interstitial edema consistent with low-grade CHF. No pleural effusion or pneumonia. Thoracic aortic atherosclerosis. Electronically Signed   By: David  [place]Jordan M.D.   On: 03/26/2016 12:25     Medications:    . amLODipine  2.5 mg Oral Daily  . aspirin EC  81 mg Oral Daily  . atorvastatin  20 mg Oral q1800  . cinacalcet  90 mg Oral Q supper  . clopidogrel  75 mg Oral Daily  . docusate sodium  100 mg Oral BID  . enoxaparin (LOVENOX) injection  30 mg Subcutaneous Q24H  . epoetin (EPOGEN/PROCRIT) injection  4,000 Units Intravenous Once  . feeding supplement (NEPRO CARB STEADY)  237 mL Oral TID BM  . insulin aspart  0-9 Units Subcutaneous TID WC  . latanoprost  1 drop Both Eyes QHS  . metoprolol tartrate  12.5 mg Oral BID  . mupirocin ointment   Nasal Daily  . senna  1 tablet Oral BID  . sevelamer carbonate  1,600 mg Oral TID WC  . sodium chloride flush  3 mL Intravenous Q12H  . venlafaxine XR  37.5 mg Oral Q breakfast   HYDROcodone-acetaminophen, labetalol, polyethylene glycol  Assessment/ Plan:  70 y.o.African American female with hypertension, diabetes mellitus type II, hyperlipidemia, thyroid disorder and ESRD on hemodialysis.    [facility]    1. End Stage Renal Disease with hyperkalemia on admission: Continue TTS schedule.  - short HD today to get her back on schedule  2.  Anemia of chronic kidney disease:  -  hemoglobin 8.4 - Continue low-dose Procrit withdialysis  3. Secondary Hyperparathyroidism:  - monitor phosphorus during this hospital  admission  4. Complication of dialysis access - poor clearance outpatient chronic hyperkalemia - Angiogram planned    LOS: 1 Khiley Lieser 12/14/201712:55 PM

## 2016-03-27 NOTE — Progress Notes (Addendum)
[facility] [place] at [facility]   PATIENT NAME: Tracy Drake    MR#:  [id number]  DATE OF BIRTH:  10/31/1945  Patient is confused and unable to provide any review of system. She was admitted for hyperkalemia pre-procedure for declotting her hemodialysis graft. She is sleeping currently. REVIEW OF SYSTEMS:   Review of Systems  Unable to perform ROS: Mental acuity   DRUG ALLERGIES:  No Known Allergies  VITALS:  Blood pressure (!) 179/58, pulse 79, temperature 98.1 F (36.7 C), temperature source Oral, resp. rate 18, height 5\' 2"  (1.575 m), weight 61.1 kg (134 lb 11.2 oz), SpO2 100 %.  PHYSICAL EXAMINATION:   Physical Exam  GENERAL:  70 y.o.-year-old patient lying in the bed with no acute distress.  EYES: Pupils equal, round, reactive to light and accommodation. No scleral icterus. Extraocular muscles intact.  HEENT: Head atraumatic, normocephalic. Oropharynx and nasopharynx clear.  NECK:  Supple, no jugular venous distention. No thyroid enlargement, no tenderness.  LUNGS: Normal breath sounds bilaterally, no wheezing, rales, rhonchi. No use of accessory muscles of respiration.  CARDIOVASCULAR: S1, S2 normal. No murmurs, rubs, or gallops.  ABDOMEN: Soft, nontender, nondistended. Bowel sounds present. No organomegaly or mass.  EXTREMITIES: No cyanosis, clubbing or edema b/l.    NEUROLOGIC: Unable to assess due to her medical condition. psycHIATRIC:Opens eyes to verbal commands. No obvious rash, lesion, or ulcer.   LABORATORY PANEL:  CBC  Recent Labs Lab 03/27/16 0500  WBC 3.7  HGB 8.4*  HCT 25.1*  PLT 110*    Chemistries   Recent Labs Lab 03/27/16 0500  NA 142  K 3.9  CL 105  CO2 30  GLUCOSE 77  BUN 41*  CREATININE 5.11*  CALCIUM 9.1   Cardiac Enzymes No results for input(s): TROPONINI in the last 168 hours. RADIOLOGY:  Dg Chest 1 View  Result Date: 03/26/2016 CLINICAL DATA:  Dialysis dependent renal failure, edema. EXAM:  CHEST 1 VIEW COMPARISON:  Portable chest x-ray of October 22, 2015 FINDINGS: The lungs are well-expanded. The interstitial markings are mildly increased. The cardiac silhouette is enlarged. The central pulmonary vascularity is mildly prominent. There is tortuosity of the descending thoracic aorta. There is mural calcification within the aorta. A vascular graft is noted the left subclavian region. IMPRESSION: Mild pulmonary interstitial edema consistent with low-grade CHF. No pleural effusion or pneumonia. Thoracic aortic atherosclerosis. Electronically Signed   By: David  SwazilandJordan M.D.   On: 03/26/2016 12:25   ASSESSMENT AND PLAN:   Tracy CarrowLouvenia Kiener  is a 70 y.o. female with a known history of diabetes, end-stage renal disease on hemodialysis, hypertension, history of stroke, hyperlipidemia, peripheral vascular disease, hyperparathyroidism- lives in a nursing home and has a state appointed power of attorney. Patient was referred her to Dr. Driscilla Grammesew's consultation for her malfunctioning HD access. she was called in  for declotting procedure of her fistula. and In preoperative checkup her potassium was noted 7.2  *Acute Hyperkalemia   End-stage renal disease on hemodialysis   malfunctioning of hemodialysis access - pt recieved IV calcium gluconate, IV insulin, D50 injection, oral Kayexalate    Vascular surgery placed a temporary hemodialysis catheter for now, once potassium is stabilized they will address the declotting of AV fistula. -Patient was dialyzed emergently her potassium is down to 3.9. -Per staff she is scheduled for declotting procedure tomorrow.  * Hypertension   Continue home medications.  * History of stroke   Continue aspirin and Plavix and blood pressure control.  *  History of diabetes   Patient is not on any antidiabetic medication, will keep on sliding scale insulin coverage   Case discussed with Care Management/Social Worker. Management plans discussed with the patient, family and  they are in agreement.  CODE STATUS: full  DVT Prophylaxis: heparin  TOTAL TIME TAKING CARE OF THIS PATIENT: 30 minutes.  >50% time spent on counselling and coordination of care  POSSIBLE D/C IN 1-2 DAYS, DEPENDING ON CLINICAL CONDITION.  Note: This dictation was prepared with Dragon dictation along with smaller phrase technology. Any transcriptional errors that result from this process are unintentional.  Zuha Dejonge M.D on 03/27/2016 at 1:01 PM  Between 7am to 6pm - Pager - [phone]  After 6pm go to [email or web address] - password [email or web address]  [facility] [place] [facility]  Office  [phone]  CC: Primary care physician; Pcp Not In System

## 2016-03-27 NOTE — Clinical Social Work Note (Signed)
Clinical Social Work Assessment  Patient Details  Name: Tracy Drake MRN: [id number] Date of Birth: 02/16/1946  Date of referral:  03/27/16               Reason for consult:  Facility Placement                Permission sought to share information with:    Permission granted to share information::     Name::        Agency::     Relationship::     Contact Information:     Housing/Transportation Living arrangements for the past 2 months:  [facility] Source of Information:  Guardian Patient Interpreter Needed:  None Criminal Activity/Legal Involvement Pertinent to Current Situation/Hospitalization:  No - Comment as needed Significant Relationships:   (DSS Guardian) Lives with:  Facility Resident Do you feel safe going back to the place where you live?    Need for family participation in patient care:  No (Coment)  Care giving concerns:  [facility].   Social Worker assessment / plan:  Patient admitted from [facility] late yesterday. Patient was sent from dialysis to specials due to access issues. Patient has a DSS Guardian: [name]: [phone]. CSW has spoken with Ms. Nolen MuMcKinney and she wishes for patient to return to [facility] center when time.  Employment status:  Retired Health and safety inspectornsurance information:  Medicare PT Recommendations:  Not assessed at this time Information / Referral to community resources:  Skilled Nursing Facility  Patient/Family's Response to care:  DSS Guardian expressed appreciation for CSW assistance.  Patient/Family's Understanding of and Emotional Response to Diagnosis, Current Treatment, and Prognosis:  Patient is confused and is not aware of her limitations currently. CSW updated DSS Guardian regarding patient's condition.  Emotional Assessment Appearance:  Appears stated age Attitude/Demeanor/Rapport:  Unable to Assess Affect (typically observed):  Unable to Assess Orientation:   Oriented to Self Alcohol / Substance use:  Not Applicable Psych involvement (Current and /or in the community):  No (Comment)  Discharge Needs  Concerns to be addressed:  Care Coordination Readmission within the last 30 days:  No Current discharge risk:  None Barriers to Discharge:  No Barriers Identified   York SpanielMonica Versia Mignogna, LCSW 03/27/2016, 11:02 AM

## 2016-03-27 NOTE — Progress Notes (Signed)
Post HD assessment unchanged  

## 2016-03-27 NOTE — NC FL2 (Signed)
MEDICAID FL2 LEVEL OF CARE SCREENING TOOL     IDENTIFICATION  Patient Name: Tracy Drake Birthdate: 08/29/1945 Sex: female Admission Date (Current Location): 03/26/2016  [facility] [place]   Facility and Address:  [facility], [address], [place], [place] [address]      Provider Number: [id number]  Attending Physician Name and Address:  Enedina FinnerSona Patel, MD  Relative Name and Phone Number:       Current Level of Care: Hospital Recommended Level of Care: Skilled Nursing Facility Prior Approval Number:    Date Approved/Denied:   PASRR Number:    Discharge Plan: SNF    Current Diagnoses: Patient Active Problem List   Diagnosis Date Noted  . Peripheral artery disease (HCC) 11/07/2015  . Essential hypertension, malignant 11/07/2015  . Hyperglycemia 11/07/2015  . Generalized weakness 11/07/2015  . Left upper extremity swelling 11/07/2015  . DNR (do not resuscitate) discussion 10/31/2015  . Palliative care encounter 10/31/2015  . Pressure ulcer 10/24/2015  . ESRD (end stage renal disease) (HCC) 10/22/2015  . Hyperkalemia 10/22/2015  . Toe osteomyelitis, right (HCC) 10/22/2015  . Acute posthemorrhagic anemia 10/22/2015  . Osteomyelitis (HCC) 10/22/2015    Orientation RESPIRATION BLADDER Height & Weight     Self  Normal Incontinent Weight: 134 lb 11.2 oz (61.1 kg) Height:  5\' 2"  (157.5 cm)  BEHAVIORAL SYMPTOMS/MOOD NEUROLOGICAL BOWEL NUTRITION STATUS   (none)  (none) Incontinent Diet (renal with fluid restriction)  AMBULATORY STATUS COMMUNICATION OF NEEDS Skin   Total Care Verbally Normal                       Personal Care Assistance Level of Assistance  Total care       Total Care Assistance: Maximum assistance   Functional Limitations Info             SPECIAL CARE FACTORS FREQUENCY                       Contractures Contractures Info: Not present    Additional Factors Info   Code Status Code Status Info: full             Current Medications (03/27/2016):  This is the current hospital active medication list Current Facility-Administered Medications  Medication Dose Route Frequency Provider Last Rate Last Dose  . amLODipine (NORVASC) tablet 2.5 mg  2.5 mg Oral Daily Altamese DillingVaibhavkumar Vachhani, MD   2.5 mg at 03/27/16 0851  . aspirin EC tablet 81 mg  81 mg Oral Daily Altamese DillingVaibhavkumar Vachhani, MD   81 mg at 03/27/16 0851  . atorvastatin (LIPITOR) tablet 20 mg  20 mg Oral q1800 Altamese DillingVaibhavkumar Vachhani, MD      . cinacalcet (SENSIPAR) tablet 90 mg  90 mg Oral Q supper Altamese DillingVaibhavkumar Vachhani, MD      . clopidogrel (PLAVIX) tablet 75 mg  75 mg Oral Daily Altamese DillingVaibhavkumar Vachhani, MD   75 mg at 03/27/16 0850  . docusate sodium (COLACE) capsule 100 mg  100 mg Oral BID Altamese DillingVaibhavkumar Vachhani, MD   100 mg at 03/27/16 0850  . enoxaparin (LOVENOX) injection 30 mg  30 mg Subcutaneous Q24H Altamese DillingVaibhavkumar Vachhani, MD      . epoetin alfa (EPOGEN,PROCRIT) injection 4,000 Units  4,000 Units Intravenous Once Harmeet Singh, MD      . feeding supplement (NEPRO CARB STEADY) liquid 237 mL  237 mL Oral TID BM Altamese DillingVaibhavkumar Vachhani, MD   237 mL at 03/27/16 0845  .  HYDROcodone-acetaminophen (NORCO/VICODIN) 5-325 MG per tablet 1-2 tablet  1-2 tablet Oral Q4H PRN Altamese DillingVaibhavkumar Vachhani, MD      . insulin aspart (novoLOG) injection 0-9 Units  0-9 Units Subcutaneous TID WC Altamese DillingVaibhavkumar Vachhani, MD      . labetalol (NORMODYNE,TRANDATE) injection 10 mg  10 mg Intravenous Q2H PRN Oralia Manisavid Willis, MD   10 mg at 03/27/16 [id number]  . latanoprost (XALATAN) 0.005 % ophthalmic solution 1 drop  1 drop Both Eyes QHS Altamese DillingVaibhavkumar Vachhani, MD   1 drop at 03/26/16 2153  . metoprolol tartrate (LOPRESSOR) tablet 12.5 mg  12.5 mg Oral BID Altamese DillingVaibhavkumar Vachhani, MD   12.5 mg at 03/27/16 0853  . mupirocin ointment (BACTROBAN) 2 %   Nasal Daily Altamese DillingVaibhavkumar Vachhani, MD      . polyethylene glycol (MIRALAX / GLYCOLAX) packet 17 g  17  g Oral Daily PRN Altamese DillingVaibhavkumar Vachhani, MD      . senna (SENOKOT) tablet 8.6 mg  1 tablet Oral BID Altamese DillingVaibhavkumar Vachhani, MD   8.6 mg at 03/27/16 0854  . sevelamer carbonate (RENVELA) tablet 1,600 mg  1,600 mg Oral TID WC Altamese DillingVaibhavkumar Vachhani, MD   1,600 mg at 03/27/16 0845  . sodium chloride flush (NS) 0.9 % injection 3 mL  3 mL Intravenous Q12H Altamese DillingVaibhavkumar Vachhani, MD   3 mL at 03/27/16 0847  . venlafaxine XR (EFFEXOR-XR) 24 hr capsule 37.5 mg  37.5 mg Oral Q breakfast Altamese DillingVaibhavkumar Vachhani, MD   37.5 mg at 03/27/16 [id number]     Discharge Medications: Please see discharge summary for a list of discharge medications.  Relevant Imaging Results:  Relevant Lab Results:   Additional Information    York SpanielMonica Rayvn Rickerson, LCSW

## 2016-03-27 NOTE — Progress Notes (Signed)
Pre dialysis  

## 2016-03-27 NOTE — Progress Notes (Signed)
Patient just returned from dialysis.

## 2016-03-27 NOTE — Care Management (Signed)
HD info faxed to [facility] HD liaison  

## 2016-03-27 NOTE — Progress Notes (Signed)
Pt CBG 66. Pt lethargic, confused, unable to safely take POs. 1 amp D50 given per protocol. Dr. Anne HahnWillis made aware. CBG now 164.

## 2016-03-28 ENCOUNTER — Encounter: Payer: Self-pay | Admitting: Vascular Surgery

## 2016-03-28 ENCOUNTER — Encounter
Admission: RE | Disposition: A | Discharge status: Skilled Nursing Facility | Payer: Self-pay | Source: Ambulatory Visit | Attending: Internal Medicine

## 2016-03-28 DIAGNOSIS — N186 End stage renal disease: Secondary | ICD-10-CM

## 2016-03-28 DIAGNOSIS — T82838A Hemorrhage of vascular prosthetic devices, implants and grafts, initial encounter: Secondary | ICD-10-CM

## 2016-03-28 HISTORY — PX: PERIPHERAL VASCULAR CATHETERIZATION: SHX172C

## 2016-03-28 LAB — GLUCOSE, CAPILLARY
GLUCOSE-CAPILLARY: 102 mg/dL — AB (ref 65–99)
GLUCOSE-CAPILLARY: 99 mg/dL (ref 65–99)
GLUCOSE-CAPILLARY: 74 mg/dL (ref 65–99)

## 2016-03-28 SURGERY — THROMBECTOMY
Anesthesia: Moderate Sedation | Laterality: Left

## 2016-03-28 MED ORDER — MIDAZOLAM HCL 2 MG/2ML IJ SOLN
INTRAMUSCULAR | Status: DC | PRN
  Administered 2016-03-28 (×3): 1 mg via INTRAVENOUS

## 2016-03-28 MED ORDER — HEPARIN SODIUM (PORCINE) 1000 UNIT/ML IJ SOLN
INTRAMUSCULAR | Status: AC
  Filled 2016-03-28: qty 1

## 2016-03-28 MED ORDER — FENTANYL CITRATE (PF) 100 MCG/2ML IJ SOLN
INTRAMUSCULAR | Status: DC | PRN
  Administered 2016-03-28 (×2): 50 ug via INTRAVENOUS

## 2016-03-28 MED ORDER — IOPAMIDOL (ISOVUE-300) INJECTION 61%
INTRAVENOUS | Status: DC | PRN
  Administered 2016-03-28: 45 mL via INTRA_ARTERIAL

## 2016-03-28 MED ORDER — LIDOCAINE-EPINEPHRINE 1 %-1:100000 IJ SOLN
INTRAMUSCULAR | Status: AC
  Filled 2016-03-28: qty 1

## 2016-03-28 MED ORDER — HEPARIN (PORCINE) IN NACL 2-0.9 UNIT/ML-% IJ SOLN
INTRAMUSCULAR | Status: AC
  Filled 2016-03-28: qty 1000

## 2016-03-28 MED ORDER — METOPROLOL TARTRATE 25 MG PO TABS: 25.0000 mg | ORAL_TABLET | Freq: Two times a day (BID) | ORAL | 6 refills | Status: AC

## 2016-03-28 MED ORDER — HEPARIN (PORCINE) IN NACL 2-0.9 UNIT/ML-% IJ SOLN
INTRAMUSCULAR | Status: DC | PRN
  Administered 2016-03-28: 3000 mL

## 2016-03-28 MED ORDER — FENTANYL CITRATE (PF) 100 MCG/2ML IJ SOLN
INTRAMUSCULAR | Status: AC
  Filled 2016-03-28: qty 2

## 2016-03-28 MED ORDER — CEFAZOLIN IN D5W 1 GM/50ML IV SOLN
INTRAVENOUS | Status: AC
  Filled 2016-03-28: qty 50

## 2016-03-28 MED ORDER — MIDAZOLAM HCL 2 MG/2ML IJ SOLN
INTRAMUSCULAR | Status: AC
  Filled 2016-03-28: qty 4

## 2016-03-28 SURGICAL SUPPLY — 24 items
BALLN DORADO 10X40X80 (BALLOONS) ×3
BALLN DORADO 8X60X80 (BALLOONS) ×3
BALLN DORADO7X100X80 (BALLOONS) ×3
BALLN LUTONIX AV 10X60X75 (BALLOONS) ×3
BALLN LUTONIX DCB 7X40X130 (BALLOONS) ×3
BALLOON DORADO 10X40X80 (BALLOONS) ×1 IMPLANT
BALLOON DORADO 8X60X80 (BALLOONS) ×1 IMPLANT
BALLOON DORADO7X100X80 (BALLOONS) ×1 IMPLANT
BALLOON LUTONIX AV 10X60X75 (BALLOONS) ×1 IMPLANT
BALLOON LUTONIX DCB 7X40X130 (BALLOONS) ×1 IMPLANT
CATH BEACON 5.038 65CM KMP-01 (CATHETERS) ×3 IMPLANT
DEVICE PRESTO INFLATION (MISCELLANEOUS) ×3 IMPLANT
DEVICE TORQUE (MISCELLANEOUS) ×3 IMPLANT
GLIDEWIRE STIFF .35X180X3 HYDR (WIRE) ×3 IMPLANT
NEEDLE ENTRY 21GA 7CM ECHOTIP (NEEDLE) ×3 IMPLANT
PACK ANGIOGRAPHY (CUSTOM PROCEDURE TRAY) ×3 IMPLANT
SET INTRO CAPELLA COAXIAL (SET/KITS/TRAYS/PACK) ×3 IMPLANT
SHEATH BRITE TIP 6FRX5.5 (SHEATH) ×6 IMPLANT
SHEATH BRITE TIP 7FRX5.5 (SHEATH) ×3 IMPLANT
STENT LIFESTAR 10X80 (Permanent Stent) ×3 IMPLANT
STENT VIABAHN 8X100X120 (Permanent Stent) ×2 IMPLANT
STENT VIABAHN 8X10X120 (Permanent Stent) ×1 IMPLANT
WIRE G.018X260 SHT (WIRE) ×3 IMPLANT
WIRE MAGIC TORQUE 260C (WIRE) ×3 IMPLANT

## 2016-03-28 NOTE — OR Nursing (Signed)
Dr Lorretta Harpschneir notified pt received Lovenox, Plavix and nephro suppliment at 11:07 am

## 2016-03-28 NOTE — Progress Notes (Addendum)
Pt prepared for d/c to [facility]. Trialysis catheter was d/c'd per order, pressure was applied for 25 minutes, pt was educated on laying flat for one hour, and site is clean and intact. Central telemetry was d/c. Skin intact except as charted in most recent assessments. Vitals are stable. Report called to receiving facility. Pt is awaiting to be transported by ambulance service. Report given to the night shift nurse.   [name]

## 2016-03-28 NOTE — H&P (Signed)
[facility] History & Physical Update  The patient was interviewed and re-examined.  The patient's previous History and Physical has been reviewed and is unchanged.  There is no change in the plan of care. We plan to proceed with the scheduled procedure.  Levora DredgeGregory Schnier, MD  03/28/2016, 2:46 PM

## 2016-03-28 NOTE — Progress Notes (Signed)
Pt dc'd [facility] via [facility], no bleeding at site, packet given to EMS.

## 2016-03-28 NOTE — Discharge Summary (Signed)
[facility] [place] [facility]   PATIENT NAME: Tracy Drake    MR#:  [id number]  DATE OF BIRTH:  04/13/1946  DATE OF ADMISSION:  03/26/2016 ADMITTING PHYSICIAN: Altamese DillingVaibhavkumar Vachhani, MD  DATE OF DISCHARGE: [phone]  PRIMARY CARE PHYSICIAN: Pcp Not In System    ADMISSION DIAGNOSIS:  LT upper extremity declot    End Stage Renal esrd  DISCHARGE DIAGNOSIS:  Acute hyperkalemia-resolved S/p declotting of fistula dementia  SECONDARY DIAGNOSIS:   Past Medical History:  Diagnosis Date  . Anemia   . Dementia   . Diabetes mellitus (HCC)    a. not on medications  . Diabetic neuropathy (HCC)   . ESRD (end stage renal disease) on dialysis (HCC)    on dialysis  . Essential hypertension   . History of stroke   . HLD (hyperlipidemia)   . Hyperparathyroidism (HCC)   . Peripheral vascular disease [facility](HCC)     HOSPITAL COURSE:   LouveniaPelzeris a 70 y.o.femalewith a known history of diabetes, end-stage renal disease on hemodialysis, hypertension, history of stroke, hyperlipidemia, peripheral vascular disease, hyperparathyroidism- lives in a nursing home and has a state appointed power of attorney. Patient was referred her to Dr. Driscilla Grammesew's consultation for her malfunctioning HD access. she was called in  for declotting procedure of her fistula. and In preoperative checkup her potassium was noted 7.2  *Acute Hyperkalemia---resolved End-stage renal disease on hemodialysis malfunctioning of hemodialysis access - pt recieved IV calcium gluconate, IV insulin, D50 injection, oral Kayexalate  -Vascular surgery placed a temporary hemodialysis catheter forurgetn HD --Patient was dialyzed emergently her potassium is down to 3.9. -s/p declotting procedure today by dr schnier  * Hypertension Continue home medications.  * History of stroke Continue aspirin and Plavix and blood pressure control.  * History of diabetes Patient is not on any  antidiabetic medication, will keep on sliding scale insulin coverage  Patient will discharge her after her declotting procedures done. She'll resume hemodialysis next week on her routine schedule. This was discussed with Dr. Thedore MinsSingh.  CONSULTS OBTAINED:  Treatment Team:  Mosetta PigeonHarmeet Singh, MD Renford DillsGregory G Schnier, MD  DRUG ALLERGIES:  No Known Allergies  DISCHARGE MEDICATIONS:   Current Discharge Medication List    CONTINUE these medications which have CHANGED   Details  metoprolol tartrate (LOPRESSOR) 25 MG tablet Take 1 tablet (25 mg total) by mouth 2 (two) times daily. Qty: 60 tablet, Refills: 6      CONTINUE these medications which have NOT CHANGED   Details  amLODipine (NORVASC) 2.5 MG tablet Take 1 tablet (2.5 mg total) by mouth daily. Qty: 30 tablet, Refills: 5    aspirin EC 81 MG tablet Take 81 mg by mouth daily.    atorvastatin (LIPITOR) 20 MG tablet Take 20 mg by mouth daily at 6 PM.    cinacalcet (SENSIPAR) 90 MG tablet Take 90 mg by mouth daily with supper.    clopidogrel (PLAVIX) 75 MG tablet Take 1 tablet (75 mg total) by mouth daily. Qty: 30 tablet, Refills: 6    docusate sodium (COLACE) 100 MG capsule Take 1 capsule (100 mg total) by mouth 2 (two) times daily. Qty: 10 capsule, Refills: 0    HYDROcodone-acetaminophen (NORCO/VICODIN) 5-325 MG tablet Take 1-2 tablets by mouth every 4 (four) hours as needed for moderate pain or severe pain. Qty: 30 tablet, Refills: 0    latanoprost (XALATAN) 0.005 % ophthalmic solution 1 drop at bedtime.    polyethylene glycol (MIRALAX / GLYCOLAX) packet Take 17  g by mouth daily as needed for mild constipation. Qty: 14 each, Refills: 0    senna (SENOKOT) 8.6 MG TABS tablet Take 1 tablet (8.6 mg total) by mouth 2 (two) times daily. Qty: 120 each, Refills: 0    sevelamer carbonate (RENVELA) 800 MG tablet Take 1,600 mg by mouth 3 (three) times daily with meals.    venlafaxine XR (EFFEXOR-XR) 37.5 MG 24 hr capsule Take 37.5 mg by  mouth daily with breakfast.    mupirocin ointment (BACTROBAN) 2 % Place into the nose daily. Qty: 22 g, Refills: 0    Nutritional Supplements (FEEDING SUPPLEMENT, NEPRO CARB STEADY,) LIQD Take 237 mLs by mouth 3 (three) times daily between meals. Qty: 90 Can, Refills: 6      STOP taking these medications     lisinopril (PRINIVIL,ZESTRIL) 20 MG tablet         If you experience worsening of your admission symptoms, develop shortness of breath, life threatening emergency, suicidal or homicidal thoughts you must seek medical attention immediately by calling 911 or calling your MD immediately  if symptoms less severe.  You Must read complete instructions/literature along with all the possible adverse reactions/side effects for all the Medicines you take and that have been prescribed to you. Take any new Medicines after you have completely understood and accept all the possible adverse reactions/side effects.   Please note  You were cared for by a hospitalist during your hospital stay. If you have any questions about your discharge medications or the care you received while you were in the hospital after you are discharged, you can call the unit and asked to speak with the hospitalist on call if the hospitalist that took care of you is not available. Once you are discharged, your primary care physician will handle any further medical issues. Please note that NO REFILLS for any discharge medications will be authorized once you are discharged, as it is imperative that you return to your primary care physician (or establish a relationship with a primary care physician if you do not have one) for your aftercare needs so that they can reassess your need for medications and monitor your lab values. Today   SUBJECTIVE  Having leg cramps.   VITAL SIGNS:  Blood pressure (!) 130/111, pulse 67, temperature 98.4 F (36.9 C), temperature source Oral, resp. rate 20, height 5\' 2"  (1.575 m), weight 50.6 kg  (111 lb 8.8 oz), SpO2 97 %.  I/O:  No intake or output data in the 24 hours ending 03/28/16 1511  PHYSICAL EXAMINATION:  GENERAL:  70 y.o.-year-old patient lying in the bed with no acute distress. Appears chronically ill. EYES: Pupils equal, round, reactive to light and accommodation. No scleral icterus. Extraocular muscles intact.  HEENT: Head atraumatic, normocephalic. Oropharynx and nasopharynx clear. Dry oral mucosa. NECK:  Supple, no jugular venous distention. No thyroid enlargement, no tenderness.  LUNGS: Normal breath sounds bilaterally, no wheezing, rales,rhonchi or crepitation. No use of accessory muscles of respiration.  CARDIOVASCULAR: S1, S2 normal. No murmurs, rubs, or gallops.  ABDOMEN: Soft, non-tender, non-distended. Bowel sounds present. No organomegaly or mass.  EXTREMITIES: No pedal edema, cyanosis, or clubbing.  NEUROLOGIC: Cranial nerves II through XII are intact. Muscle strength 5/5 in all extremities. Sensation intact. Gait not checked.  PSYCHIATRIC: The patient is alert and oriented x 3.  SKIN: No obvious rash, lesion, or ulcer.   DATA REVIEW:   CBC   Recent Labs Lab 03/27/16 0500  WBC 3.7  HGB 8.4*  HCT 25.1*  PLT 110*    Chemistries   Recent Labs Lab 03/27/16 0500  NA 142  K 3.9  CL 105  CO2 30  GLUCOSE 77  BUN 41*  CREATININE 5.11*  CALCIUM 9.1    Microbiology Results   Recent Results (from the past 240 hour(s))  CULTURE, BLOOD (ROUTINE X 2) w Reflex to ID Panel     Status: None (Preliminary result)   Collection Time: 03/26/16  2:07 PM  Result Value Ref Range Status   Specimen Description BLOOD R HAND  Final   Special Requests BOTTLES DRAWN AEROBIC AND ANAEROBIC ANA 6 AER  Final   Culture NO GROWTH 2 DAYS  Final   Report Status PENDING  Incomplete  CULTURE, BLOOD (ROUTINE X 2) w Reflex to ID Panel     Status: None (Preliminary result)   Collection Time: 03/26/16  2:16 PM  Result Value Ref Range Status   Specimen Description  BLOOD  R WRIST  Final   Special Requests BOTTLES DRAWN AEROBIC AND ANAEROBIC  ANA 7 AER  Final   Culture NO GROWTH 2 DAYS  Final   Report Status PENDING  Incomplete  MRSA PCR Screening     Status: None   Collection Time: 03/27/16  3:03 AM  Result Value Ref Range Status   MRSA by PCR NEGATIVE NEGATIVE Final    Comment:        The GeneXpert MRSA Assay (FDA approved for NASAL specimens only), is one component of a comprehensive MRSA colonization surveillance program. It is not intended to diagnose MRSA infection nor to guide or monitor treatment for MRSA infections.     RADIOLOGY:  No results found.   Management plans discussed with the patient, family and they are in agreement.  CODE STATUS:     Code Status Orders        Start     Ordered   03/26/16 1152  Full code  Continuous     03/26/16 1151    Code Status History    Date Active Date Inactive Code Status Order ID Comments User Context   03/26/2016 11:46 AM 03/26/2016 11:51 AM DNR [id number]  Altamese Dilling, MD Inpatient   11/06/2015  4:51 PM 11/07/2015  9:20 PM DNR [id number]  Renford Dills, MD Inpatient   10/30/2015  3:47 PM 11/06/2015  4:51 PM DNR [id number]  Canary Brim, NP Inpatient   10/22/2015  8:48 PM 10/30/2015  3:47 PM Full Code [id number]  Katharina Caper, MD Inpatient      TOTAL TIME TAKING CARE OF THIS PATIENT: 40 minutes.    Vonne Mcdanel M.D on 03/28/2016 at 3:11 PM  Between 7am to 6pm - Pager - [phone] After 6pm go to [email or web address] - password [email or web address]  [facility]  Office  [phone]  CC: Primary care physician; Pcp Not In System

## 2016-03-28 NOTE — Progress Notes (Signed)
Subjective:  No acute complaints. Awaiting angiogram   Objective:  Vital signs in last 24 hours:  Temp:  [96.9 F (36.1 C)-98.4 F (36.9 C)] 98.4 F (36.9 C) (12/15 1229) Pulse Rate:  [68-85] 68 (12/15 1229) Resp:  [16-22] 16 (12/15 1229) BP: (132-176)/(53-99) 152/55 (12/15 1229) SpO2:  [97 %-100 %] 99 % (12/15 1229) Weight:  [50.6 kg (111 lb 8.8 oz)-51.8 kg (114 lb 3.2 oz)] 50.6 kg (111 lb 8.8 oz) (12/14 1716)  Weight change: -9.889 kg (-21 lb 12.8 oz) Filed Weights   03/26/16 1952 03/27/16 1409 03/27/16 1716  Weight: 61.1 kg (134 lb 11.2 oz) 51.8 kg (114 lb 3.2 oz) 50.6 kg (111 lb 8.8 oz)    Intake/Output:   No intake or output data in the 24 hours ending 03/28/16 1406   Physical Exam: General: No acute distress, laying in the bed  HEENT Anicteric, moist oral mucous membranes  Neck supple  Pulm/lungs Normal breathing effort, clear to auscultation  CVS/Heart No rub or gallop  Abdomen:  Soft, nontender  Extremities: No peripheral edema  Neurologic: alert, able to answer questions however disoriented, appears to be at her baseline mental status  Skin: No acute rashes  Access: Left upper arm AV fistula,bruit and thrill present   Femoral temp cath    Basic Metabolic Panel:   Recent Labs Lab 03/26/16 0955 03/26/16 1407 03/26/16 1710 03/27/16 0500  NA  --  143  --  142  K 7.2* 6.2* 5.0 3.9  CL  --  112*  --  105  CO2  --  19*  --  30  GLUCOSE  --  53*  --  77  BUN  --  126*  --  41*  CREATININE  --  10.77*  --  5.11*  CALCIUM  --  9.6  --  9.1     CBC:  Recent Labs Lab 03/26/16 1407 03/27/16 0500  WBC 4.8 3.7  HGB 8.9* 8.4*  HCT 26.7* 25.1*  MCV 96.4 97.2  PLT 166 110*      Microbiology:  Recent Results (from the past 720 hour(s))  CULTURE, BLOOD (ROUTINE X 2) w Reflex to ID Panel     Status: None (Preliminary result)   Collection Time: 03/26/16  2:07 PM  Result Value Ref Range Status   Specimen Description BLOOD R HAND  Final   Special  Requests BOTTLES DRAWN AEROBIC AND ANAEROBIC ANA 6 AER 8ML  Final   Culture NO GROWTH 2 DAYS  Final   Report Status PENDING  Incomplete  CULTURE, BLOOD (ROUTINE X 2) w Reflex to ID Panel     Status: None (Preliminary result)   Collection Time: 03/26/16  2:16 PM  Result Value Ref Range Status   Specimen Description BLOOD  R WRIST  Final   Special Requests BOTTLES DRAWN AEROBIC AND ANAEROBIC  ANA 7 AER 6ML  Final   Culture NO GROWTH 2 DAYS  Final   Report Status PENDING  Incomplete  MRSA PCR Screening     Status: None   Collection Time: 03/27/16  3:03 AM  Result Value Ref Range Status   MRSA by PCR NEGATIVE NEGATIVE Final    Comment:        The GeneXpert MRSA Assay (FDA approved for NASAL specimens only), is one component of a comprehensive MRSA colonization surveillance program. It is not intended to diagnose MRSA infection nor to guide or monitor treatment for MRSA infections.     Coagulation Studies: No results  for input(s): LABPROT, INR in the last 72 hours.  Urinalysis: No results for input(s): COLORURINE, LABSPEC, PHURINE, GLUCOSEU, HGBUR, BILIRUBINUR, KETONESUR, PROTEINUR, UROBILINOGEN, NITRITE, LEUKOCYTESUR in the last 72 hours.  Invalid input(s): APPERANCEUR    Imaging: No results found.   Medications:    . amLODipine  10 mg Oral Daily  . aspirin EC  81 mg Oral Daily  . atorvastatin  20 mg Oral q1800  . cinacalcet  90 mg Oral Q supper  . clopidogrel  75 mg Oral Daily  . docusate sodium  100 mg Oral BID  . epoetin (EPOGEN/PROCRIT) injection  4,000 Units Intravenous Once  . feeding supplement (NEPRO CARB STEADY)  237 mL Oral TID BM  . insulin aspart  0-9 Units Subcutaneous TID WC  . latanoprost  1 drop Both Eyes QHS  . metoprolol tartrate  25 mg Oral BID  . mupirocin ointment   Nasal Daily  . senna  1 tablet Oral BID  . sevelamer carbonate  1,600 mg Oral TID WC  . sodium chloride flush  3 mL Intravenous Q12H  . venlafaxine XR  37.5 mg Oral Q breakfast    HYDROcodone-acetaminophen, labetalol, polyethylene glycol  Assessment/ Plan:  70 y.o.African American female with hypertension, diabetes mellitus type II, hyperlipidemia, thyroid disorder and ESRD on hemodialysis.    [facility] [address]    1. End Stage Renal Disease with hyperkalemia on admission: Continue TTS schedule.  - next hemodialysis scheduled for Saturday  2.  Anemia of chronic kidney disease:  -  hemoglobin 8.4 - Continue low-dose Procrit withdialysis  3. Secondary Hyperparathyroidism:  - monitor phosphorus during this hospital admission  4. Complication of dialysis access - poor clearance outpatient chronic hyperkalemia - Angiogram planned    LOS: 2 Day Deery 12/15/20172:06 PM

## 2016-03-28 NOTE — Op Note (Signed)
OPERATIVE NOTE   PROCEDURE: 1. Contrast injection left brachiocephalic  AV access 2. Percutaneous transluminal angioplasty and stent placement left brachiocephalic fistula peripheral segment 3. Percutaneous transluminal angioplasty and stent placement central venous segment  PRE-OPERATIVE DIAGNOSIS: Complication of dialysis access                                                       End Stage Renal Disease  POST-OPERATIVE DIAGNOSIS: same as above   SURGEON: Katha Cabal, M.D.  ANESTHESIA: Conscious sedation was administered under my direct supervision. IV Versed plus fentanyl were utilized. Continuous ECG, pulse oximetry and blood pressure was monitored throughout the entire procedure.  Conscious sedation was for a total of 50 minutes.  ESTIMATED BLOOD LOSS: minimal  FINDING(S): Stricture of the AV graft within the peripheral segment as well as a stricture within the central venous portion  SPECIMEN(S):  None  CONTRAST: 45 cc  FLUOROSCOPY TIME: 11.9 minutes  INDICATIONS: Tracy Drake is a 70 y.o. female who  presents with malfunctioning left brachiocephalic left arm AV access.  The patient is scheduled for angiography with possible intervention of the AV access.  The patient is aware the risks include but are not limited to: bleeding, infection, thrombosis of the cannulated access, and possible anaphylactic reaction to the contrast.  The patient acknowledges if the access can not be salvaged a tunneled catheter will be needed and will be placed during this procedure.  The patient is aware of the risks of the procedure and elects to proceed with the angiogram and intervention.  DESCRIPTION: After full informed written consent was obtained, the patient was brought back to the Special Procedure suite and placed supine position.  Appropriate cardiopulmonary monitors were placed.  The left arm was prepped and draped in the standard fashion.  Appropriate timeout is called. The  left brachiocephalic fistula  was cannulated with a micropuncture needle.  Cannulation was performed with ultrasound guidance. Ultrasound was placed in a sterile sleeve, the AV access was interrogated and noted to be echolucent and compressible indicating patency. Image was recorded for the permanent record. The puncture is performed under continuous ultrasound visualization.   The microwire was advanced and the needle was exchanged for  a microsheath.  The J-wire was then advanced and a 6 Fr sheath inserted, later in the case the sheath was upsized to a 7 French sheath to accommodate the Viabahn stent.  Hand injections were completed to image the access from the arterial anastomosis through the entire access.  The central venous structures were also imaged by hand injections.  Based on the images, there are multiple string signs within the cephalic vein at the level of the shoulder. There is an apparent occlusion of the innominate vein in its midsegment in the midportion of the previously placed stent. 3000 units of heparin was given and a wire was negotiated through the strictures within the venous portion of the graft as well as the central stenosis. The sheath was then upsized to a 7 [place] sheath.  An 8 mm balloon was used.  Inflation was to approximately 20 atm.  The detector was then repositioned over the peripheral portion of the AV access and 7 mm balloon was used to treat the stricture within the AV access. Inflation was to 16 atm, both inflations were for 1 minute.  A 10 x 80 like*stent was then deployed in the innominate vein essentially bridging the previously placed stent this was then postdilated with a 10 mm Lutonix inflation to 14 atm for 1 minute. After this an 8 x 100 Viabahn was deployed in the cephalic vein and this was postdilated with an 8 mm Dorado balloon. Inflation was to 16-20 atm for approximately 1 minute. Then a 10 x 4 Dorado balloon was used to redilate the innominate lesion with  inflations to 18-20 atm for 1 minute.  Follow-up imaging demonstrates significant improvement with a marked reduction of the strictures. There is less than 5% residual stenosis throughout both of the treated segments  There is now rapid flow of contrast through the graft and the central veins.   A 4-0 Monocryl purse-string suture was sewn around the sheath.  The sheath was removed and light pressure was applied.  A sterile bandage was applied to the puncture site.    COMPLICATIONS: None  CONDITION: [name], M.D [facility] Office: [phone]  03/28/2016 5:14 PM

## 2016-03-28 NOTE — Clinical Social Work Note (Signed)
Patient to discharge to return to [facility] today. CSW has left message for the DSS Guardian: [name] to make her aware. [facility] Patient to transport via EMS. York SpanielMonica Yael Angerer MSW,LCSW [phone]

## 2016-03-31 ENCOUNTER — Encounter: Payer: Self-pay | Admitting: Vascular Surgery

## 2016-03-31 LAB — CULTURE, BLOOD (ROUTINE X 2)
Culture: NO GROWTH
Culture: NO GROWTH

## 2016-06-11 ENCOUNTER — Inpatient Hospital Stay
Admission: EM | Admit: 2016-06-11 | Discharge: 2016-07-13 | DRG: 388 | Disposition: E | Payer: Medicare Other | Attending: Internal Medicine | Admitting: Internal Medicine

## 2016-06-11 ENCOUNTER — Encounter: Admission: EM | Disposition: E | Payer: Self-pay | Source: Home / Self Care | Attending: Internal Medicine

## 2016-06-11 ENCOUNTER — Inpatient Hospital Stay: Admit: 2016-06-11 | Payer: Medicare Other | Admitting: Vascular Surgery

## 2016-06-11 ENCOUNTER — Emergency Department: Payer: Medicare Other

## 2016-06-11 ENCOUNTER — Encounter: Payer: Self-pay | Admitting: *Deleted

## 2016-06-11 DIAGNOSIS — E860 Dehydration: Secondary | ICD-10-CM | POA: Diagnosis present

## 2016-06-11 DIAGNOSIS — N186 End stage renal disease: Secondary | ICD-10-CM | POA: Diagnosis present

## 2016-06-11 DIAGNOSIS — E079 Disorder of thyroid, unspecified: Secondary | ICD-10-CM | POA: Diagnosis present

## 2016-06-11 DIAGNOSIS — N2581 Secondary hyperparathyroidism of renal origin: Secondary | ICD-10-CM | POA: Diagnosis present

## 2016-06-11 DIAGNOSIS — E1122 Type 2 diabetes mellitus with diabetic chronic kidney disease: Secondary | ICD-10-CM | POA: Diagnosis present

## 2016-06-11 DIAGNOSIS — Z7982 Long term (current) use of aspirin: Secondary | ICD-10-CM

## 2016-06-11 DIAGNOSIS — Z7401 Bed confinement status: Secondary | ICD-10-CM

## 2016-06-11 DIAGNOSIS — K55059 Acute (reversible) ischemia of intestine, part and extent unspecified: Secondary | ICD-10-CM | POA: Diagnosis present

## 2016-06-11 DIAGNOSIS — R197 Diarrhea, unspecified: Secondary | ICD-10-CM | POA: Diagnosis not present

## 2016-06-11 DIAGNOSIS — A419 Sepsis, unspecified organism: Secondary | ICD-10-CM | POA: Diagnosis not present

## 2016-06-11 DIAGNOSIS — Z8249 Family history of ischemic heart disease and other diseases of the circulatory system: Secondary | ICD-10-CM

## 2016-06-11 DIAGNOSIS — E785 Hyperlipidemia, unspecified: Secondary | ICD-10-CM | POA: Diagnosis present

## 2016-06-11 DIAGNOSIS — Z4659 Encounter for fitting and adjustment of other gastrointestinal appliance and device: Secondary | ICD-10-CM

## 2016-06-11 DIAGNOSIS — Z89421 Acquired absence of other right toe(s): Secondary | ICD-10-CM

## 2016-06-11 DIAGNOSIS — Z7189 Other specified counseling: Secondary | ICD-10-CM

## 2016-06-11 DIAGNOSIS — I639 Cerebral infarction, unspecified: Secondary | ICD-10-CM | POA: Diagnosis not present

## 2016-06-11 DIAGNOSIS — E875 Hyperkalemia: Secondary | ICD-10-CM | POA: Diagnosis present

## 2016-06-11 DIAGNOSIS — D631 Anemia in chronic kidney disease: Secondary | ICD-10-CM | POA: Diagnosis present

## 2016-06-11 DIAGNOSIS — Z66 Do not resuscitate: Secondary | ICD-10-CM | POA: Diagnosis not present

## 2016-06-11 DIAGNOSIS — G9341 Metabolic encephalopathy: Secondary | ICD-10-CM | POA: Diagnosis not present

## 2016-06-11 DIAGNOSIS — R1110 Vomiting, unspecified: Secondary | ICD-10-CM

## 2016-06-11 DIAGNOSIS — R109 Unspecified abdominal pain: Secondary | ICD-10-CM

## 2016-06-11 DIAGNOSIS — K56609 Unspecified intestinal obstruction, unspecified as to partial versus complete obstruction: Principal | ICD-10-CM | POA: Diagnosis present

## 2016-06-11 DIAGNOSIS — I248 Other forms of acute ischemic heart disease: Secondary | ICD-10-CM | POA: Diagnosis not present

## 2016-06-11 DIAGNOSIS — E1140 Type 2 diabetes mellitus with diabetic neuropathy, unspecified: Secondary | ICD-10-CM | POA: Diagnosis present

## 2016-06-11 DIAGNOSIS — J9600 Acute respiratory failure, unspecified whether with hypoxia or hypercapnia: Secondary | ICD-10-CM | POA: Diagnosis not present

## 2016-06-11 DIAGNOSIS — F329 Major depressive disorder, single episode, unspecified: Secondary | ICD-10-CM | POA: Diagnosis present

## 2016-06-11 DIAGNOSIS — R1084 Generalized abdominal pain: Secondary | ICD-10-CM

## 2016-06-11 DIAGNOSIS — R6521 Severe sepsis with septic shock: Secondary | ICD-10-CM | POA: Diagnosis not present

## 2016-06-11 DIAGNOSIS — E1151 Type 2 diabetes mellitus with diabetic peripheral angiopathy without gangrene: Secondary | ICD-10-CM | POA: Diagnosis present

## 2016-06-11 DIAGNOSIS — M24551 Contracture, right hip: Secondary | ICD-10-CM | POA: Diagnosis present

## 2016-06-11 DIAGNOSIS — I871 Compression of vein: Secondary | ICD-10-CM | POA: Diagnosis present

## 2016-06-11 DIAGNOSIS — Z7902 Long term (current) use of antithrombotics/antiplatelets: Secondary | ICD-10-CM

## 2016-06-11 DIAGNOSIS — J690 Pneumonitis due to inhalation of food and vomit: Secondary | ICD-10-CM | POA: Diagnosis not present

## 2016-06-11 DIAGNOSIS — R112 Nausea with vomiting, unspecified: Secondary | ICD-10-CM

## 2016-06-11 DIAGNOSIS — F0390 Unspecified dementia without behavioral disturbance: Secondary | ICD-10-CM | POA: Diagnosis present

## 2016-06-11 DIAGNOSIS — E119 Type 2 diabetes mellitus without complications: Secondary | ICD-10-CM

## 2016-06-11 DIAGNOSIS — I69392 Facial weakness following cerebral infarction: Secondary | ICD-10-CM

## 2016-06-11 DIAGNOSIS — Z515 Encounter for palliative care: Secondary | ICD-10-CM | POA: Diagnosis not present

## 2016-06-11 DIAGNOSIS — K56699 Other intestinal obstruction unspecified as to partial versus complete obstruction: Secondary | ICD-10-CM

## 2016-06-11 DIAGNOSIS — I2510 Atherosclerotic heart disease of native coronary artery without angina pectoris: Secondary | ICD-10-CM | POA: Diagnosis present

## 2016-06-11 DIAGNOSIS — Z992 Dependence on renal dialysis: Secondary | ICD-10-CM

## 2016-06-11 DIAGNOSIS — I151 Hypertension secondary to other renal disorders: Secondary | ICD-10-CM | POA: Diagnosis present

## 2016-06-11 LAB — T4, FREE: Free T4: 1.19 ng/dL — ABNORMAL HIGH (ref 0.61–1.12)

## 2016-06-11 LAB — CBC
HCT: 37.5 % (ref 35.0–47.0)
Hemoglobin: 12.6 g/dL (ref 12.0–16.0)
MCH: 33.4 pg (ref 26.0–34.0)
MCHC: 33.6 g/dL (ref 32.0–36.0)
MCV: 99.4 fL (ref 80.0–100.0)
PLATELETS: 189 10*3/uL (ref 150–440)
RBC: 3.77 MIL/uL — ABNORMAL LOW (ref 3.80–5.20)
RDW: 14.2 % (ref 11.5–14.5)
WBC: 7.5 10*3/uL (ref 3.6–11.0)

## 2016-06-11 LAB — COMPREHENSIVE METABOLIC PANEL
ALK PHOS: 136 U/L — AB (ref 38–126)
ALT: 14 U/L (ref 14–54)
AST: 17 U/L (ref 15–41)
Albumin: 4 g/dL (ref 3.5–5.0)
Anion gap: 12 (ref 5–15)
BILIRUBIN TOTAL: 0.5 mg/dL (ref 0.3–1.2)
BUN: 57 mg/dL — AB (ref 6–20)
CALCIUM: 9.8 mg/dL (ref 8.9–10.3)
CO2: 24 mmol/L (ref 22–32)
CREATININE: 5.33 mg/dL — AB (ref 0.44–1.00)
Chloride: 103 mmol/L (ref 101–111)
GFR, EST AFRICAN AMERICAN: 8 mL/min — AB (ref >60)
GFR, EST NON AFRICAN AMERICAN: 7 mL/min — AB (ref >60)
Glucose, Bld: 179 mg/dL — ABNORMAL HIGH (ref 65–99)
Potassium: 5.2 mmol/L — ABNORMAL HIGH (ref 3.5–5.1)
Sodium: 139 mmol/L (ref 135–145)
TOTAL PROTEIN: 7.9 g/dL (ref 6.5–8.1)

## 2016-06-11 LAB — MRSA PCR SCREENING: MRSA by PCR: NEGATIVE

## 2016-06-11 LAB — LACTIC ACID, PLASMA: LACTIC ACID, VENOUS: 1 mmol/L (ref 0.5–1.9)

## 2016-06-11 LAB — TSH: TSH: 13.341 u[IU]/mL — ABNORMAL HIGH (ref 0.350–4.500)

## 2016-06-11 LAB — LIPASE, BLOOD: Lipase: 39 U/L (ref 11–51)

## 2016-06-11 SURGERY — SVC VENOGRAPHY
Anesthesia: Moderate Sedation

## 2016-06-11 MED ORDER — HYDRALAZINE HCL 20 MG/ML IJ SOLN
10.0000 mg | INTRAMUSCULAR | Status: AC
  Administered 2016-06-11: 10 mg via INTRAVENOUS
  Filled 2016-06-11: qty 1

## 2016-06-11 MED ORDER — MORPHINE SULFATE (PF) 2 MG/ML IV SOLN
INTRAVENOUS | Status: AC
  Administered 2016-06-11: 2 mg via INTRAVENOUS
  Filled 2016-06-11: qty 1

## 2016-06-11 MED ORDER — MORPHINE SULFATE (PF) 2 MG/ML IV SOLN
2.0000 mg | Freq: Once | INTRAVENOUS | Status: AC
  Administered 2016-06-11: 2 mg via INTRAVENOUS

## 2016-06-11 MED ORDER — HEPARIN SODIUM (PORCINE) 5000 UNIT/ML IJ SOLN
5000.0000 [IU] | Freq: Three times a day (TID) | INTRAMUSCULAR | Status: DC
  Administered 2016-06-11 – 2016-06-14 (×7): 5000 [IU] via SUBCUTANEOUS
  Filled 2016-06-11 (×7): qty 1

## 2016-06-11 MED ORDER — ONDANSETRON 4 MG PO TBDP
4.0000 mg | ORAL_TABLET | Freq: Once | ORAL | Status: AC
  Administered 2016-06-11: 4 mg via ORAL

## 2016-06-11 MED ORDER — LATANOPROST 0.005 % OP SOLN
1.0000 [drp] | Freq: Every day | OPHTHALMIC | Status: DC
  Administered 2016-06-12 – 2016-06-13 (×3): 1 [drp] via OPHTHALMIC
  Filled 2016-06-11 (×2): qty 2.5

## 2016-06-11 MED ORDER — DOCUSATE SODIUM 100 MG PO CAPS
100.0000 mg | ORAL_CAPSULE | Freq: Two times a day (BID) | ORAL | Status: DC
  Administered 2016-06-11 – 2016-06-12 (×3): 100 mg via ORAL
  Filled 2016-06-11 (×3): qty 1

## 2016-06-11 MED ORDER — ACETAMINOPHEN 650 MG RE SUPP
650.0000 mg | Freq: Four times a day (QID) | RECTAL | Status: DC | PRN
  Administered 2016-06-12: 650 mg via RECTAL
  Filled 2016-06-11: qty 1

## 2016-06-11 MED ORDER — SODIUM CHLORIDE 0.9 % IV BOLUS (SEPSIS)
500.0000 mL | Freq: Once | INTRAVENOUS | Status: AC
  Administered 2016-06-11: 500 mL via INTRAVENOUS

## 2016-06-11 MED ORDER — CLOPIDOGREL BISULFATE 75 MG PO TABS
75.0000 mg | ORAL_TABLET | Freq: Every day | ORAL | Status: DC
  Administered 2016-06-11 – 2016-06-12 (×2): 75 mg via ORAL
  Filled 2016-06-11 (×2): qty 1

## 2016-06-11 MED ORDER — SEVELAMER CARBONATE 800 MG PO TABS
1600.0000 mg | ORAL_TABLET | Freq: Three times a day (TID) | ORAL | Status: DC
  Administered 2016-06-12: 1600 mg via ORAL
  Filled 2016-06-11: qty 2

## 2016-06-11 MED ORDER — ONDANSETRON 4 MG PO TBDP
ORAL_TABLET | ORAL | Status: AC
  Administered 2016-06-11: 4 mg via ORAL
  Filled 2016-06-11: qty 1

## 2016-06-11 MED ORDER — POLYETHYLENE GLYCOL 3350 17 G PO PACK: 17.0000 g | PACK | Freq: Every day | ORAL | Status: DC | PRN

## 2016-06-11 MED ORDER — ATORVASTATIN CALCIUM 20 MG PO TABS
20.0000 mg | ORAL_TABLET | Freq: Every day | ORAL | Status: DC
  Administered 2016-06-11 – 2016-06-12 (×2): 20 mg via ORAL
  Filled 2016-06-11 (×2): qty 1

## 2016-06-11 MED ORDER — ONDANSETRON HCL 4 MG/2ML IJ SOLN
4.0000 mg | Freq: Four times a day (QID) | INTRAMUSCULAR | Status: DC | PRN
  Administered 2016-06-11 – 2016-06-12 (×2): 4 mg via INTRAVENOUS
  Filled 2016-06-11 (×2): qty 2

## 2016-06-11 MED ORDER — ASPIRIN EC 81 MG PO TBEC
81.0000 mg | DELAYED_RELEASE_TABLET | Freq: Every day | ORAL | Status: DC
  Administered 2016-06-11 – 2016-06-12 (×2): 81 mg via ORAL
  Filled 2016-06-11 (×2): qty 1

## 2016-06-11 MED ORDER — NITROGLYCERIN 2 % TD OINT
0.5000 [in_us] | TOPICAL_OINTMENT | Freq: Four times a day (QID) | TRANSDERMAL | Status: DC
  Administered 2016-06-11 – 2016-06-12 (×6): 0.5 [in_us] via TOPICAL
  Filled 2016-06-11 (×6): qty 1

## 2016-06-11 MED ORDER — HYDRALAZINE HCL 20 MG/ML IJ SOLN
10.0000 mg | Freq: Three times a day (TID) | INTRAMUSCULAR | Status: DC
  Administered 2016-06-11 – 2016-06-12 (×2): 10 mg via INTRAVENOUS
  Filled 2016-06-11 (×3): qty 1

## 2016-06-11 MED ORDER — SODIUM CHLORIDE 0.9 % IV BOLUS (SEPSIS): 1000.0000 mL | Freq: Once | INTRAVENOUS | Status: DC

## 2016-06-11 MED ORDER — CINACALCET HCL 30 MG PO TABS
90.0000 mg | ORAL_TABLET | Freq: Every day | ORAL | Status: DC
  Administered 2016-06-12: 90 mg via ORAL
  Filled 2016-06-11 (×2): qty 3

## 2016-06-11 MED ORDER — METOPROLOL TARTRATE 25 MG PO TABS
25.0000 mg | ORAL_TABLET | Freq: Two times a day (BID) | ORAL | Status: DC
  Administered 2016-06-11 – 2016-06-12 (×3): 25 mg via ORAL
  Filled 2016-06-11 (×3): qty 1

## 2016-06-11 MED ORDER — NEPRO/CARBSTEADY PO LIQD
237.0000 mL | Freq: Three times a day (TID) | ORAL | Status: DC
  Administered 2016-06-11 – 2016-06-12 (×3): 237 mL via ORAL

## 2016-06-11 MED ORDER — VENLAFAXINE HCL ER 37.5 MG PO CP24
37.5000 mg | ORAL_CAPSULE | Freq: Every day | ORAL | Status: DC
  Filled 2016-06-11 (×2): qty 1

## 2016-06-11 MED ORDER — AMLODIPINE BESYLATE 5 MG PO TABS
2.5000 mg | ORAL_TABLET | Freq: Every day | ORAL | Status: DC
  Administered 2016-06-11: 2.5 mg via ORAL
  Filled 2016-06-11: qty 1

## 2016-06-11 MED ORDER — ACETAMINOPHEN 325 MG PO TABS
650.0000 mg | ORAL_TABLET | Freq: Four times a day (QID) | ORAL | Status: DC | PRN
  Administered 2016-06-12: 650 mg via ORAL
  Filled 2016-06-11: qty 2

## 2016-06-11 MED ORDER — ONDANSETRON HCL 4 MG PO TABS: 4.0000 mg | ORAL_TABLET | Freq: Four times a day (QID) | ORAL | Status: DC | PRN

## 2016-06-11 MED ORDER — SODIUM CHLORIDE 0.9% FLUSH
3.0000 mL | Freq: Two times a day (BID) | INTRAVENOUS | Status: DC
  Administered 2016-06-11 – 2016-06-13 (×4): 3 mL via INTRAVENOUS

## 2016-06-11 NOTE — Consult Note (Signed)
Patient ID: Tracy Drake, female   DOB: [birth year], 71 y.o.   MRN: [id number]  CC: Abdominal pain  HPI Tracy Drake is a 71 y.o. female with multiple medical problems currently admitted to the medicine service for abdominal pain. Surgery consult requested by Dr. Sherryll Burger for evaluation of possible small bowel obstruction. Patient has reported history of stroke, diabetes, end-stage renal disease requiring hemodialysis. She has reported history of nausea and vomiting for 2 days. All the history for this consultation is obtained from the chart as patient is not interested in talking at the time of my consultation.  HPI  Past Medical History:  Diagnosis Date  . Anemia   . Dementia   . Diabetes mellitus (HCC)    a. not on medications  . Diabetic neuropathy (HCC)   . ESRD (end stage renal disease) on dialysis (HCC)    on dialysis  . Essential hypertension   . History of stroke   . HLD (hyperlipidemia)   . Hyperparathyroidism (HCC)   . Peripheral vascular disease [facility])     Past Surgical History:  Procedure Laterality Date  . AMPUTATION TOE Right 11/02/2015   Procedure: AMPUTATION TOE;  Surgeon: Gwyneth Revels, DPM;  Location: [facility] ORS;  Service: Podiatry;  Laterality: Right;  . PERIPHERAL VASCULAR CATHETERIZATION Right 10/24/2015   Procedure: Lower Extremity Angiography;  Surgeon: Annice Needy, MD;  Location: [facility] INVASIVE CV LAB;  Service: Cardiovascular;  Laterality: Right;  . PERIPHERAL VASCULAR CATHETERIZATION Right 11/01/2015   Procedure: Lower Extremity Angiography;  Surgeon: Annice Needy, MD;  Location: [facility] INVASIVE CV LAB;  Service: Cardiovascular;  Laterality: Right;  . PERIPHERAL VASCULAR CATHETERIZATION  11/01/2015   Procedure: Lower Extremity Intervention;  Surgeon: Annice Needy, MD;  Location: [facility] INVASIVE CV LAB;  Service: Cardiovascular;;  . PERIPHERAL VASCULAR CATHETERIZATION N/A 11/06/2015   Procedure: A/V Shuntogram/Fistulagram;  Surgeon: Renford Dills, MD;  Location: [facility]  INVASIVE CV LAB;  Service: Cardiovascular;  Laterality: N/A;  . PERIPHERAL VASCULAR CATHETERIZATION N/A 11/06/2015   Procedure: A/V Shunt Intervention;  Surgeon: Renford Dills, MD;  Location: [facility] INVASIVE CV LAB;  Service: Cardiovascular;  Laterality: N/A;  . PERIPHERAL VASCULAR CATHETERIZATION N/A 11/29/2015   Procedure: Dialysis/Perma Catheter Removal;  Surgeon: Annice Needy, MD;  Location: [facility] INVASIVE CV LAB;  Service: Cardiovascular;  Laterality: N/A;  . PERIPHERAL VASCULAR CATHETERIZATION  03/26/2016   Procedure: Dialysis/Perma Catheter Insertion;  Surgeon: Renford Dills, MD;  Location: [facility] INVASIVE CV LAB;  Service: Cardiovascular;;  . PERIPHERAL VASCULAR CATHETERIZATION Left 03/28/2016   Procedure: Thrombectomy;  Surgeon: Renford Dills, MD;  Location: [facility] INVASIVE CV LAB;  Service: Cardiovascular;  Laterality: Left;    Family History  Problem Relation Age of Onset  . Hypertension Mother     Social History Social History  Substance Use Topics  . Smoking status: Never Smoker  . Smokeless tobacco: Never Used  . Alcohol use No    No Known Allergies  Current Facility-Administered Medications  Medication Dose Route Frequency Provider Last Rate Last Dose  . acetaminophen (TYLENOL) tablet 650 mg  650 mg Oral Q6H PRN Arnaldo Natal, MD       Or  . acetaminophen (TYLENOL) suppository 650 mg  650 mg Rectal Q6H PRN Arnaldo Natal, MD      . amLODipine [facility]) tablet 2.5 mg  2.5 mg Oral Daily Arnaldo Natal, MD   2.5 mg at 06-28-16 1450  . aspirin EC tablet 81 mg  81 mg Oral Daily Casimiro Needle  Elias Else, MD   81 mg at 05/27/2016 1450  . atorvastatin (LIPITOR) tablet 20 mg  20 mg Oral q1800 Arnaldo Natal, MD      . cinacalcet [facility]) tablet 90 mg  90 mg Oral Q supper Arnaldo Natal, MD      . clopidogrel (PLAVIX) tablet 75 mg  75 mg Oral Daily Arnaldo Natal, MD   75 mg at 05/26/2016 1450  . docusate sodium (COLACE) capsule 100 mg  100 mg Oral BID Arnaldo Natal, MD   100 mg at 06/06/2016 1450  . feeding supplement (NEPRO CARB STEADY) liquid 237 mL  237 mL Oral TID BM Arnaldo Natal, MD   237 mL at 06/04/2016 1452  . heparin injection 5,000 Units  5,000 Units Subcutaneous Q8H Arnaldo Natal, MD   5,000 Units at 06/06/2016 1451  . hydrALAZINE (APRESOLINE) injection 10 mg  10 mg Intravenous TID Delfino Lovett, MD      . latanoprost (XALATAN) 0.005 % ophthalmic solution 1 drop  1 drop Both Eyes QHS Arnaldo Natal, MD      . metoprolol tartrate (LOPRESSOR) tablet 25 mg  25 mg Oral BID Arnaldo Natal, MD   25 mg at 05/21/2016 1450  . nitroGLYCERIN (NITROGLYN) 2 % ointment 0.5 inch  0.5 inch Topical Q6H Arnaldo Natal, MD   0.5 inch at 06/08/2016 1451  . ondansetron (ZOFRAN) tablet 4 mg  4 mg Oral Q6H PRN Arnaldo Natal, MD       Or  . ondansetron [facility]) injection 4 mg  4 mg Intravenous Q6H PRN Arnaldo Natal, MD      . polyethylene glycol [facility] / GLYCOLAX) packet 17 g  17 g Oral Daily PRN Arnaldo Natal, MD      . sevelamer carbonate (RENVELA) tablet 1,600 mg  1,600 mg Oral TID WC Arnaldo Natal, MD      . sodium chloride flush (NS) 0.9 % injection 3 mL  3 mL Intravenous Q12H Arnaldo Natal, MD   3 mL at 05/17/2016 1451  . [START ON 06/12/2016] venlafaxine XR (EFFEXOR-XR) 24 hr capsule 37.5 mg  37.5 mg Oral Q breakfast Arnaldo Natal, MD         Review of Systems A Review of systems was unable to be obtained during this consultation as patient is not willing to talk currently.  Physical Exam Blood pressure (!) 178/51, pulse 75, temperature 98.6 F (37 C), temperature source Oral, resp. rate 16, height 5\' 4"  (1.626 m), weight 54.4 kg (120 lb), SpO2 100 %. CONSTITUTIONAL: Resting in bed in no obvious distress. EYES: Pupils are equal, round, and reactive to light, Sclera are non-icteric. EARS, NOSE, MOUTH AND THROAT: The oropharynx is clear. The oral mucosa is pink and moist. Hearing is intact to voice. LYMPH NODES:  Lymph nodes  in the neck are normal. RESPIRATORY:  Lungs are clear.  CARDIOVASCULAR: Heart is regular without murmurs, gallops, or rubs. GI: The abdomen is soft, generalized tenderness noted, but her abdomen is nondistended. There are no palpable masses. There is no hepatosplenomegaly. There are normal bowel sounds in all quadrants. GU: Rectal deferred.   MUSCULOSKELETAL: Normal muscle strength and tone. No cyanosis or edema.   SKIN: Turgor is good and there are no obvious pathologic skin lesions or ulcers. NEUROLOGIC: Motor and sensation was not evaluated PSYCH:  Patient does not wish to talk during my visit  Data Reviewed Images and labs reviewed. Labs significant  for elevated creatinine of 5.33 however she is a dialysis patient. Also elevated BUN of 57. Potassium of 5.2. White blood cell count is normal at 7.5, lactic acid level is normal at 1. CT scan of the abdomen was reviewed. Does appear to show some dilated loops of small bowel as well as numerous clips in the abdomen from prior surgeries. No evidence of free air however there is some fluid present within the abdomen. I have personally reviewed the patient's imaging, laboratory findings and medical records.    Assessment    Abdominal pain    Plan    71 year old female multiple medical problems admitted currently for abdominal pain with some nausea and vomiting. Patient is a very poor surgical candidate given her multiple comorbidities. She appears to have numerous clips in her abdomen from prior surgeries however that surgical intervention is not documented within the chart. It appears she is likely had a bowel resection in the past. Given her current clinical situation, should she continue to have nausea and vomiting would recommend nothing by mouth status and potential NG tube decompression. Again, patient is a very poor surgical candidate. In the absence of clear indications of reversible problem secondary to her surgery would hesitate to perform  any intervention. Clear treatment goals need to be determined. Surgery will continue to follow with you.     Time spent with the patient was 80 minutes, with more than 50% of the time spent in face-to-face education, counseling and care coordination.     Ricarda Frameharles Codee Tutson, MD FACS General Surgeon 2017-03-04, 4:50 PM

## 2016-06-11 NOTE — ED Notes (Signed)
Consent was given to the surgeon but was not signed before he left the unit.

## 2016-06-11 NOTE — Clinical Social Work Note (Signed)
Clinical Social Work Assessment  Patient Details  Name: Tracy Drake MRN: [id number] Date of Birth: 01/22/1946  Date of referral:  06/10/2016               Reason for consult:  Discharge Planning (admitted from [facility])                Permission sought to share information with:  Case Manager, Facility Medical sales representativeContact Representative, Guardian Permission granted to share information::  Yes, Verbal Permission Granted  Name::        [facility]  Relationship::  Guardian:  APS   Tracy Drake, [phone]  ([employer])  Contact Information:     Housing/Transportation Living arrangements for the past 2 months:  Skilled Nursing Facility Source of Information:  Patient, Facility, Guardian Patient Interpreter Needed:  None Criminal Activity/Legal Involvement Pertinent to Current Situation/Hospitalization:  No - Comment as needed Significant Relationships:  Merchandiser, retailCommunity Support Lives with:  Facility Resident Do you feel safe going back to the place where you live?  Yes Need for family participation in patient care:  Yes (Comment) (guardian APS)  Care giving concerns:  LCSW spoke with guardian who is aware of patient being admitted inpatient and current needs.  No concerns noted. Will have patient return [facility] at discharge where she is a long term resident.  APS reports patient has no local family in the area and no contact with family per APS.  Social Worker assessment / plan:  LCSW received consult that [facility]. Plan is for return at discharge. Patient is a long term resident. FL2 updated. Unit CSW to follow up with disposition of needs.  Employment status:  Retired Health and safety inspectornsurance information:  Medicare PT Recommendations:  Not assessed at this time Information / Referral to community resources:     Patient/Family's Response to care:  Agreeable for plan  Patient/Family's Understanding of and Emotional Response to Diagnosis, Current  Treatment, and Prognosis:  Guardian voices understanding and current treatment plan as detailed in conversation.  Emotional Assessment Appearance:  Appears stated age Attitude/Demeanor/Rapport:    Affect (typically observed):  Adaptable Orientation:  Oriented to Self, Oriented to Place Alcohol / Substance use:  Not Applicable Psych involvement (Current and /or in the community):  No (Comment)  Discharge Needs  Concerns to be addressed:  No discharge needs identified Readmission within the last 30 days:  No Current discharge risk:  None Barriers to Discharge:  No Barriers Identified, Continued Medical Work up   Tracy Drake, Tracy Bittman N, LCSW 05/29/2016, 11:29 AM

## 2016-06-11 NOTE — ED Notes (Signed)
Report off to iris rn  

## 2016-06-11 NOTE — ED Notes (Signed)
Pt unable to lie still on stretcher.  Pt states my stomach is hurting.

## 2016-06-11 NOTE — H&P (Signed)
Tracy Drake is an 71 y.o. female.   Chief Complaint: Abdominal pain HPI: The patient with past medical history of stroke, diabetes and end-stage renal disease on dialysis presents to the emergency department complaining of abdominal pain. The patient states that she has been nauseous and having a bloody nonbilious emesis for 2 days. Initial presentation was significant for clinical dehydration. CT of the abdomen showed a questionable transition point in the middle of the small bowel which could represent obstruction or chronic constipation. Surgical service was consulted who recommended small fluid bolus that may hydrate the intestinal mucosa some more in order to differentiate any obstruction on further imaging. However, the patient has such poor IV access. We have been unable to start intravenous hydration. A central venous catheter was attempted on the left femoral due to contracture of her right leg but she has had some instrumentation of that vessel which prevented catheter placement. Due to her complicated medical issues emergency department staff called the hospitalist service for further management.  Past Medical History:  Diagnosis Date  . Anemia   . Dementia   . Diabetes mellitus ([place])    a. not on medications  . Diabetic neuropathy ([place])   . ESRD (end stage renal disease) on dialysis ([place])    on dialysis  . Essential hypertension   . History of stroke   . HLD (hyperlipidemia)   . Hyperparathyroidism ([place])   . Peripheral vascular disease [facility])     Past Surgical History:  Procedure Laterality Date  . AMPUTATION TOE Right 11/02/2015   Procedure: AMPUTATION TOE;  Surgeon: Samara Deist, DPM;  Location: [facility] ORS;  Service: Podiatry;  Laterality: Right;  . PERIPHERAL VASCULAR CATHETERIZATION Right 10/24/2015   Procedure: Lower Extremity Angiography;  Surgeon: Algernon Huxley, MD;  Location: [place] [facility];  Service: Cardiovascular;  Laterality: Right;  . PERIPHERAL VASCULAR  CATHETERIZATION Right 11/01/2015   Procedure: Lower Extremity Angiography;  Surgeon: Algernon Huxley, MD;  Location: [place] [facility];  Service: Cardiovascular;  Laterality: Right;  . PERIPHERAL VASCULAR CATHETERIZATION  11/01/2015   Procedure: Lower Extremity Intervention;  Surgeon: Algernon Huxley, MD;  Location: [place] [facility];  Service: Cardiovascular;;  . PERIPHERAL VASCULAR CATHETERIZATION N/A 11/06/2015   Procedure: A/V Shuntogram/Fistulagram;  Surgeon: Katha Cabal, MD;  Location: [place] [facility];  Service: Cardiovascular;  Laterality: N/A;  . PERIPHERAL VASCULAR CATHETERIZATION N/A 11/06/2015   Procedure: A/V Shunt Intervention;  Surgeon: Katha Cabal, MD;  Location: [place] [facility];  Service: Cardiovascular;  Laterality: N/A;  . PERIPHERAL VASCULAR CATHETERIZATION N/A 11/29/2015   Procedure: Dialysis/Perma Catheter Removal;  Surgeon: Algernon Huxley, MD;  Location: [place] [facility];  Service: Cardiovascular;  Laterality: N/A;  . PERIPHERAL VASCULAR CATHETERIZATION  03/26/2016   Procedure: Dialysis/Perma Catheter Insertion;  Surgeon: Katha Cabal, MD;  Location: [place] [facility];  Service: Cardiovascular;;  . PERIPHERAL VASCULAR CATHETERIZATION Left 03/28/2016   Procedure: Thrombectomy;  Surgeon: Katha Cabal, MD;  Location: [place] [facility];  Service: Cardiovascular;  Laterality: Left;    Family History  Problem Relation Age of Onset  . Hypertension Mother    Social History:  reports that she has never smoked. She has never used smokeless tobacco. She reports that she does not drink alcohol. Her drug history is not on file.  Allergies: No Known Allergies  Prior to Admission medications   Medication Sig Start Date End Date Taking? Authorizing Provider  amLODipine (NORVASC) 2.5 MG tablet  Take 1 tablet (2.5 mg total) by mouth daily. 11/07/15  Yes Theodoro Grist, MD  aspirin EC 81 MG tablet Take 81 mg by mouth daily.   Yes Historical Provider, MD   atorvastatin (LIPITOR) 20 MG tablet Take 20 mg by mouth daily at 6 PM.   Yes Historical Provider, MD  cinacalcet (SENSIPAR) 90 MG tablet Take 90 mg by mouth daily with supper.   Yes Historical Provider, MD  clopidogrel (PLAVIX) 75 MG tablet Take 1 tablet (75 mg total) by mouth daily. 11/03/15  Yes Theodoro Grist, MD  docusate sodium (COLACE) 100 MG capsule Take 1 capsule (100 mg total) by mouth 2 (two) times daily. 11/03/15  Yes Theodoro Grist, MD  HYDROcodone-acetaminophen (NORCO/VICODIN) 5-325 MG tablet Take 1-2 tablets by mouth every 4 (four) hours as needed for moderate pain or severe pain. 11/07/15  Yes Theodoro Grist, MD  latanoprost (XALATAN) 0.005 % ophthalmic solution 1 drop at bedtime.   Yes Historical Provider, MD  metoprolol tartrate (LOPRESSOR) 25 MG tablet Take 1 tablet (25 mg total) by mouth 2 (two) times daily. 03/28/16  Yes Fritzi Mandes, MD  Nutritional Supplements (FEEDING SUPPLEMENT, NEPRO CARB STEADY,) LIQD Take 237 mLs by mouth 3 (three) times daily between meals. 11/07/15  Yes Theodoro Grist, MD  polyethylene glycol (MIRALAX / GLYCOLAX) packet Take 17 g by mouth daily as needed for mild constipation. 11/03/15  Yes Theodoro Grist, MD  senna (SENOKOT) 8.6 MG TABS tablet Take 1 tablet (8.6 mg total) by mouth 2 (two) times daily. 11/03/15  Yes Theodoro Grist, MD  sevelamer carbonate (RENVELA) 800 MG tablet Take 1,600 mg by mouth 3 (three) times daily with meals.   Yes Historical Provider, MD  venlafaxine XR (EFFEXOR-XR) 37.5 MG 24 hr capsule Take 37.5 mg by mouth daily with breakfast.   Yes Historical Provider, MD  mupirocin ointment (BACTROBAN) 2 % Place into the nose daily. Patient not taking: Reported on 03/26/2016 11/03/15   Theodoro Grist, MD     Results for orders placed or performed during the hospital encounter of 05/16/2016 (from the past 48 hour(s))  Lipase, blood     Status: None   Collection Time: 05/31/2016  2:02 AM  Result Value Ref Range   Lipase 39 11 - 51 U/L  Comprehensive  metabolic panel     Status: Abnormal   Collection Time: 06/03/2016  2:02 AM  Result Value Ref Range   Sodium 139 135 - 145 mmol/L   Potassium 5.2 (H) 3.5 - 5.1 mmol/L   Chloride 103 101 - 111 mmol/L   CO2 24 22 - 32 mmol/L   Glucose, Bld 179 (H) 65 - 99 mg/dL   BUN 57 (H) 6 - 20 mg/dL   Creatinine, Ser 5.33 (H) 0.44 - 1.00 mg/dL   Calcium 9.8 8.9 - 10.3 mg/dL   Total Protein 7.9 6.5 - 8.1 g/dL   Albumin 4.0 3.5 - 5.0 g/dL   AST 17 15 - 41 U/L   ALT 14 14 - 54 U/L   Alkaline Phosphatase 136 (H) 38 - 126 U/L   Total Bilirubin 0.5 0.3 - 1.2 mg/dL   GFR calc non Af Amer 7 (L) >60 mL/min   GFR calc Af Amer 8 (L) >60 mL/min    Comment: (NOTE) The eGFR has been calculated using the CKD EPI equation. This calculation has not been validated in all clinical situations. eGFR's persistently <60 mL/min signify possible Chronic Kidney Disease.    Anion gap 12 5 - 15  CBC  Status: Abnormal   Collection Time: 05/23/2016  2:02 AM  Result Value Ref Range   WBC 7.5 3.6 - 11.0 K/uL   RBC 3.77 (L) 3.80 - 5.20 MIL/uL   Hemoglobin 12.6 12.0 - 16.0 g/dL   HCT 37.5 35.0 - 47.0 %   MCV 99.4 80.0 - 100.0 fL   MCH 33.4 26.0 - 34.0 pg   MCHC 33.6 32.0 - 36.0 g/dL   RDW 14.2 11.5 - 14.5 %   Platelets 189 150 - 440 K/uL   Ct Abdomen Pelvis Wo Contrast  Result Date: 05/30/2016 CLINICAL DATA:  Abdominal pain for 30 minutes.  Vomiting. EXAM: CT ABDOMEN AND PELVIS WITHOUT CONTRAST TECHNIQUE: Multidetector CT imaging of the abdomen and pelvis was performed following the standard protocol without IV contrast. COMPARISON:  None. FINDINGS: Lower chest: Multi chamber cardiomegaly. There are coronary artery calcifications. Ascending aorta is partially included, however appears dilated measuring 4.3 cm. Trace pericardial effusion. Breathing motion artifact partially obscures evaluation. Hepatobiliary: Motion artifact. Allowing for this, no focal hepatic lesion. Small volume of perihepatic fluid adjacent to the  inferior liver tip. Gallbladder tentatively identified and nondistended. Pancreas: Motion obscured, no peripancreatic inflammation or evidence pancreatitis. Spleen: Normal in size. Adrenals/Urinary Tract: No adrenal nodule. Atrophic kidneys with small cortical cysts. No hydronephrosis. Bladder is minimally distended. Stomach/Bowel: Motion and paucity of intra-abdominal fat limits bowel assessment. Stomach is not well-defined, appears decompressed. Dilated edematous appearing fluid-filled small bowel in the central and left abdomen. Associated mesenteric haziness. Transition point is tentatively identified in the central abdomen image 40 series 2. Distal small bowel loops are decompressed. There is stool distending the rectum with rectal wall thickening. More proximal colon is nondistended, enteric sutures noted in the splenic flexure and possibly hepatic flexures. No pneumatosis. Vascular/Lymphatic: Advanced aortic and branch atherosclerosis. No aneurysm or periaortic soft tissue stranding. Limited assessment for adenopathy given lack of body fat and noncontrast technique. Reproductive: Calcified uterine fibroids.  No gross adnexal mass. Other: Multiple surgical clips throughout the abdomen. Small volume of free fluid in the left and right pericolic gutter. Musculoskeletal: There are no acute or suspicious osseous abnormalities. Chronic changes in the spine. IMPRESSION: 1. Small-bowel obstruction, transition poor tentatively identified in the central abdomen. Mesenteric edema and small volume of free fluid in the pericolic gutters. 2. Advanced vascular calcifications.  No aortic aneurysm. 3. Stool distending the rectum with mild rectal wall thickening, may be chronic constipation. 4. Cardiomegaly noted in the included lower thorax. There is aneurysmal dilatation of the ascending aorta, partially included, maximal dimension 4.3 cm. Recommend annual imaging followup by CTA or MRA. This recommendation follows 2010  ACCF/AHA/AATS/ACR/ASA/SCA/SCAI/SIR/STS/SVM Guidelines for the Diagnosis and Management of Patients with Thoracic Aortic Disease. Circulation. 2010; 121: F121-F758 Electronically Signed   By: Jeb Levering M.D.   On: 06/10/2016 03:33    Review of Systems  Constitutional: Negative for chills and fever.  HENT: Negative for sore throat and tinnitus.   Eyes: Negative for blurred vision and redness.  Respiratory: Negative for cough and shortness of breath.   Cardiovascular: Negative for chest pain, palpitations, orthopnea and PND.  Gastrointestinal: Positive for constipation, nausea and vomiting. Negative for abdominal pain and diarrhea.  Genitourinary: Negative for dysuria, frequency and urgency.  Musculoskeletal: Negative for joint pain and myalgias.  Skin: Negative for rash.       No lesions  Neurological: Positive for weakness. Negative for speech change and focal weakness.  Endo/Heme/Allergies: Does not bruise/bleed easily.       No  temperature intolerance  Psychiatric/Behavioral: Negative for depression and suicidal ideas.    Blood pressure (!) 219/71, pulse 78, temperature 97.8 F (36.6 C), temperature source Oral, resp. rate 18, height _0  (1.626 m), weight 54.4 kg (120 lb), SpO2 100 %. Physical Exam  Vitals reviewed. Constitutional: She is oriented to person, place, and time. She appears well-developed and well-nourished. No distress.  HENT:  Head: Normocephalic and atraumatic.  Mouth/Throat: Oropharynx is clear and moist.  Eyes: Conjunctivae and EOM are normal. Pupils are equal, round, and reactive to light. No scleral icterus.  Neck: Normal range of motion. Neck supple. No JVD present. No tracheal deviation present. No thyromegaly present.  Cardiovascular: Normal rate, regular rhythm and normal heart sounds.  Exam reveals no gallop and no friction rub.   No murmur heard. Respiratory: Effort normal and breath sounds normal.  GI: Soft. Bowel sounds are normal. She exhibits no  distension and no mass. There is tenderness. There is no rebound and no guarding.  Genitourinary:  Genitourinary Comments: Deferred  Musculoskeletal: She exhibits no edema.  Contractures of right leg and left arm; AV fistula left upper extremity  Lymphadenopathy:    She has no cervical adenopathy.  Neurological: She is alert and oriented to person, place, and time. A cranial nerve deficit (facial droop from old stroke) is present. She exhibits normal muscle tone.  Skin: Skin is warm and dry. No rash noted. No erythema.  Psychiatric: She has a normal mood and affect. Her behavior is normal. Judgment and thought content normal.     Assessment/Plan This is a 71 year old female admitted for small bowel obstruction. 1. Small bowel obstruction: Unclear functional or anatomical obstruction at this time. Follow surgery recommendations regarding hydration keeping in mind the patient also scheduled for dialysis today. Surgery service to follow. Nothing by mouth except for clears and medication. 2. Hypertension: Secondary to end-stage renal disease; I placed Nitropaste on her chest. Another option for blood pressure control may be to allow clonidine to dissolve on the patient's tongue. Once IV access established may use IV hydralazine. May improve with dialysis. Restart oral antihypertensive medication when gut motility restored 3. Cerebrovascular disease: Stable; continue aspirin and Plavix. History of old stroke. 4. End-stage renal disease: On dialysis. Potassium also elevated. No arrhythmias. Continue calcium binders as well as Sensipar. 5. Depression: Continue Effexor 6. Glucose intolerance: Diet controlled 7. Hyperlipidemia: Continue statin therapy 8. DVT prophylaxis: Heparin 9. GI prophylaxis: PPI per home regimen The patient is a full code. (She has been DO NOT RESUSCITATE in the past; consider palliative care consult to revisit this issue). Time spent on admission orders and patient care  approximately 45 minutes  Harrie Foreman, MD 05/25/2016, 7:21 AM

## 2016-06-11 NOTE — ED Notes (Signed)
Spoke with pt DSS/guardian and they states pt last received dialysis yesterday, she goes Tues, th, saturday.

## 2016-06-11 NOTE — ED Provider Notes (Signed)
[facility] Emergency Department Provider Note   ____________________________________________   First MD Initiated Contact with Patient 06/03/2016 [phone]     (approximate)  I have reviewed the triage vital signs and the nursing notes.   HISTORY  Chief Complaint Abdominal Pain  Limited history as patient is a vague historian  HPI Tracy Drake is a 71 y.o. female brought to the ED via EMS from [facility] with a chief complaint of abdominal pain, nausea and vomiting. Patient has end-stage renal disease, on hemodialysis Monday/Wednesday/Friday, who reports sudden onset of mid abdominal pain 30 minutes prior to arrival. Symptoms associated with nausea and vomiting.Denies recent fever, chest pain, shortness of breath, diarrhea. Rest of history is somewhat limited secondary to patient being a poor historian.   Past Medical History:  Diagnosis Date  . Anemia   . Dementia   . Diabetes mellitus (HCC)    a. not on medications  . Diabetic neuropathy (HCC)   . ESRD (end stage renal disease) on dialysis (HCC)    on dialysis  . Essential hypertension   . History of stroke   . HLD (hyperlipidemia)   . Hyperparathyroidism (HCC)   . Peripheral vascular disease [facility](HCC)     Patient Active Problem List   Diagnosis Date Noted  . SBO (small bowel obstruction) 05/28/2016  . Peripheral artery disease (HCC) 11/07/2015  . Essential hypertension, malignant 11/07/2015  . Hyperglycemia 11/07/2015  . Generalized weakness 11/07/2015  . Left upper extremity swelling 11/07/2015  . DNR (do not resuscitate) discussion 10/31/2015  . Palliative care encounter 10/31/2015  . Pressure ulcer 10/24/2015  . ESRD (end stage renal disease) (HCC) 10/22/2015  . Hyperkalemia 10/22/2015  . Toe osteomyelitis, right (HCC) 10/22/2015  . Acute posthemorrhagic anemia 10/22/2015  . Osteomyelitis (HCC) 10/22/2015    Past Surgical History:  Procedure Laterality Date  . AMPUTATION  TOE Right 11/02/2015   Procedure: AMPUTATION TOE;  Surgeon: Gwyneth RevelsJustin Fowler, DPM;  Location: [facility] ORS;  Service: Podiatry;  Laterality: Right;  . PERIPHERAL VASCULAR CATHETERIZATION Right 10/24/2015   Procedure: Lower Extremity Angiography;  Surgeon: Annice NeedyJason S Dew, MD;  Location: [facility] INVASIVE CV LAB;  Service: Cardiovascular;  Laterality: Right;  . PERIPHERAL VASCULAR CATHETERIZATION Right 11/01/2015   Procedure: Lower Extremity Angiography;  Surgeon: Annice NeedyJason S Dew, MD;  Location: [facility] INVASIVE CV LAB;  Service: Cardiovascular;  Laterality: Right;  . PERIPHERAL VASCULAR CATHETERIZATION  11/01/2015   Procedure: Lower Extremity Intervention;  Surgeon: Annice NeedyJason S Dew, MD;  Location: [facility] INVASIVE CV LAB;  Service: Cardiovascular;;  . PERIPHERAL VASCULAR CATHETERIZATION N/A 11/06/2015   Procedure: A/V Shuntogram/Fistulagram;  Surgeon: Renford DillsGregory G Schnier, MD;  Location: [facility] INVASIVE CV LAB;  Service: Cardiovascular;  Laterality: N/A;  . PERIPHERAL VASCULAR CATHETERIZATION N/A 11/06/2015   Procedure: A/V Shunt Intervention;  Surgeon: Renford DillsGregory G Schnier, MD;  Location: [facility] INVASIVE CV LAB;  Service: Cardiovascular;  Laterality: N/A;  . PERIPHERAL VASCULAR CATHETERIZATION N/A 11/29/2015   Procedure: Dialysis/Perma Catheter Removal;  Surgeon: Annice NeedyJason S Dew, MD;  Location: [facility] INVASIVE CV LAB;  Service: Cardiovascular;  Laterality: N/A;  . PERIPHERAL VASCULAR CATHETERIZATION  03/26/2016   Procedure: Dialysis/Perma Catheter Insertion;  Surgeon: Renford DillsGregory G Schnier, MD;  Location: [facility] INVASIVE CV LAB;  Service: Cardiovascular;;  . PERIPHERAL VASCULAR CATHETERIZATION Left 03/28/2016   Procedure: Thrombectomy;  Surgeon: Renford DillsGregory G Schnier, MD;  Location: [facility] INVASIVE CV LAB;  Service: Cardiovascular;  Laterality: Left;    Prior to Admission medications   Medication Sig Start Date End Date Taking?  Authorizing Provider  amLODipine (NORVASC) 2.5 MG tablet Take 1 tablet (2.5 mg total) by mouth daily. 11/07/15  Yes Katharina Caper,  MD  aspirin EC 81 MG tablet Take 81 mg by mouth daily.   Yes Historical Provider, MD  atorvastatin (LIPITOR) 20 MG tablet Take 20 mg by mouth daily at 6 PM.   Yes Historical Provider, MD  cinacalcet (SENSIPAR) 90 MG tablet Take 90 mg by mouth daily with supper.   Yes Historical Provider, MD  clopidogrel (PLAVIX) 75 MG tablet Take 1 tablet (75 mg total) by mouth daily. 11/03/15  Yes Katharina Caper, MD  docusate sodium (COLACE) 100 MG capsule Take 1 capsule (100 mg total) by mouth 2 (two) times daily. 11/03/15  Yes Katharina Caper, MD  HYDROcodone-acetaminophen (NORCO/VICODIN) 5-325 MG tablet Take 1-2 tablets by mouth every 4 (four) hours as needed for moderate pain or severe pain. 11/07/15  Yes Katharina Caper, MD  latanoprost (XALATAN) 0.005 % ophthalmic solution 1 drop at bedtime.   Yes Historical Provider, MD  metoprolol tartrate (LOPRESSOR) 25 MG tablet Take 1 tablet (25 mg total) by mouth 2 (two) times daily. 03/28/16  Yes Enedina Finner, MD  Nutritional Supplements (FEEDING SUPPLEMENT, NEPRO CARB STEADY,) LIQD Take 237 mLs by mouth 3 (three) times daily between meals. 11/07/15  Yes Katharina Caper, MD  polyethylene glycol (MIRALAX / GLYCOLAX) packet Take 17 g by mouth daily as needed for mild constipation. 11/03/15  Yes Katharina Caper, MD  senna (SENOKOT) 8.6 MG TABS tablet Take 1 tablet (8.6 mg total) by mouth 2 (two) times daily. 11/03/15  Yes Katharina Caper, MD  sevelamer carbonate (RENVELA) 800 MG tablet Take 1,600 mg by mouth 3 (three) times daily with meals.   Yes Historical Provider, MD  venlafaxine XR (EFFEXOR-XR) 37.5 MG 24 hr capsule Take 37.5 mg by mouth daily with breakfast.   Yes Historical Provider, MD  mupirocin ointment (BACTROBAN) 2 % Place into the nose daily. Patient not taking: Reported on 03/26/2016 11/03/15   Katharina Caper, MD    Allergies Patient has no known allergies.  Family History  Problem Relation Age of Onset  . Hypertension Mother     Social History Social History    Substance Use Topics  . Smoking status: Never Smoker  . Smokeless tobacco: Never Used  . Alcohol use No    Review of Systems  Constitutional: No fever/chills. Eyes: No visual changes. ENT: No sore throat. Cardiovascular: Denies chest pain. Respiratory: Denies shortness of breath. Gastrointestinal: Positive for abdominal pain, nausea and vomiting.  No diarrhea.  No constipation. Genitourinary: Negative for dysuria. Musculoskeletal: Negative for back pain. Skin: Negative for rash. Neurological: Negative for headaches, focal weakness or numbness.  10-point ROS otherwise negative.  ____________________________________________   PHYSICAL EXAM:  VITAL SIGNS: ED Triage Vitals  Enc Vitals Group     BP 2016/06/19 0208 (!) 168/110     Pulse Rate 06/19/2016 0208 65     Resp 19-Jun-2016 0208 20     Temp 2016-06-19 0208 97.8 F (36.6 C)     Temp Source June 19, 2016 0208 Oral     SpO2 2016/06/19 0208 100 %     Weight Jun 19, 2016 0200 120 lb (54.4 kg)     Height Jun 19, 2016 0200 5\' 4"  (1.626 m)     Head Circumference --      Peak Flow --      Pain Score 2016/06/19 0200 10     Pain Loc --      Pain Edu? --  Excl. in GC? --     Constitutional: Alert and oriented. Chronically ill appearing and in mild acute distress. Eyes: Conjunctivae are normal. PERRL. EOMI. Head: Atraumatic. Nose: No congestion/rhinnorhea. Mouth/Throat: Mucous membranes are moist.  Oropharynx non-erythematous. Neck: No stridor.   Cardiovascular: Normal rate, regular rhythm. II/VI SEM murmur.  Good peripheral circulation. Respiratory: Normal respiratory effort.  No retractions. Lungs CTAB. Gastrointestinal: Soft and tender to palpation at umbilicus without rebound or guarding. Large midline abdominal scar. No distention. No abdominal bruits. No CVA tenderness. Musculoskeletal: No lower extremity tenderness nor edema.  No joint effusions. Left AV fistula. Neurologic:  Normal speech and language. No gross focal neurologic  deficits are appreciated. Skin:  Skin is warm, dry and intact. No rash noted. Psychiatric: Mood and affect are normal. Speech and behavior are normal.  ____________________________________________   LABS (all labs ordered are listed, but only abnormal results are displayed)  Labs Reviewed  COMPREHENSIVE METABOLIC PANEL - Abnormal; Notable for the following:       Result Value   Potassium 5.2 (*)    Glucose, Bld 179 (*)    BUN 57 (*)    Creatinine, Ser 5.33 (*)    Alkaline Phosphatase 136 (*)    GFR calc non Af Amer 7 (*)    GFR calc Af Amer 8 (*)    All other components within normal limits  CBC - Abnormal; Notable for the following:    RBC 3.77 (*)    All other components within normal limits  LIPASE, BLOOD  LACTIC ACID, PLASMA  LACTIC ACID, PLASMA   ____________________________________________  EKG  ED ECG REPORT I, Tamecka Milham J, the attending physician, personally viewed and interpreted this ECG.   Date: 2016/07/07  EKG Time: 0201  Rate: 60  Rhythm: normal EKG, normal sinus rhythm  Axis: LAD  Intervals:none  ST&T Change: Nonspecific  ____________________________________________  RADIOLOGY  CT abdomen and pelvis interpreted per Dr. Manus Gunning: 1. Small-bowel obstruction, transition poor tentatively identified  in the central abdomen. Mesenteric edema and small volume of free  fluid in the pericolic gutters.  2. Advanced vascular calcifications. No aortic aneurysm.  3. Stool distending the rectum with mild rectal wall thickening, may  be chronic constipation.  4. Cardiomegaly noted in the included lower thorax. There is  aneurysmal dilatation of the ascending aorta, partially included,  maximal dimension 4.3 cm. Recommend annual imaging followup by CTA  or MRA. This recommendation follows 2010  ACCF/AHA/AATS/ACR/ASA/SCA/SCAI/SIR/STS/SVM Guidelines for the  Diagnosis and Management of Patients with Thoracic Aortic Disease.  Circulation. 2010; 121: Z610-R604     ___________________________________________   PROCEDURES  Procedure(s) performed: None  Procedures  Critical Care performed: No  ____________________________________________   INITIAL IMPRESSION / ASSESSMENT AND PLAN / ED COURSE  Pertinent labs & imaging results that were available during my care of the patient were reviewed by me and considered in my medical decision making (see chart for details).  71 year old female sent by SNF for acute abdominal pain with nausea and vomiting. Unclear what is her midline abdominal scar is from; I cannot find in her medical records any record of abdominal surgeries. Given patient's distress, will obtain urgent noncontrast CT abdomen/pelvis to evaluate SBO, aorta, and other potential causes of patient's abdominal pain.  Clinical Course as of Jun 11 699  Wed 07-07-2016  0407 Discussed with surgeon on call Dr. Everlene Farrier who reviewed CT scan. No clear indication for surgery at this time; will hold NG tube as patient has not vomited while  in the emergency department. Will obtain lactic acid, IV fluid resuscitation and discuss with hospitalist to evaluate patient in the emergency department for admission.  [JS]  [id number] Patient subsequently lost her right EJ. Left femoral CVL attempted unsuccessfully. Unable to attempt on the right side secondary to patient's vascular procedures. Multiple attempts at PIV's unsuccessful by nursing staff. Discussed with hospitalist who will consult PICC line team for IV placement.  [JS]    Clinical Course User Index [JS] Irean Hong, MD     ____________________________________________   FINAL CLINICAL IMPRESSION(S) / ED DIAGNOSES  Final diagnoses:  Generalized abdominal pain  Non-intractable vomiting, presence of nausea not specified, unspecified vomiting type  SBO (small bowel obstruction)      NEW MEDICATIONS STARTED DURING THIS VISIT:  New Prescriptions   No medications on file     Note:  This  document was prepared using Dragon voice recognition software and may include unintentional dictation errors.    Irean Hong, MD 05/27/2016 [phone]

## 2016-06-11 NOTE — ED Notes (Signed)
Attempted to flush EJ line, EJ will not flush.  Fleet Contrasachel, RN to attempt ultrasound IV.  Lab notified to stick for blood.  EDP notified of loss of access at this time.

## 2016-06-11 NOTE — ED Notes (Signed)
Lab unable to obtain blood for patient.  EDP notified.

## 2016-06-11 NOTE — Progress Notes (Signed)
[facility]  ROUNDING NOTE   Subjective:   [name] admitted to [facility] on 05/22/2016 for small bowel obstruction.   Last dialysis was yesterday.   Objective:  Vital signs in last 24 hours:  Temp:  [97.8 F (36.6 C)] 97.8 F (36.6 C) (02/28 0208) Pulse Rate:  [65-83] 83 (02/28 1100) Resp:  [15-23] 23 (02/28 1115) BP: (141-224)/(50-110) 141/50 (02/28 1115) SpO2:  [96 %-100 %] 96 % (02/28 1100) Weight:  [54.4 kg (120 lb)] 54.4 kg (120 lb) (02/28 0200)  Weight change:  Filed Weights   05/16/2016 0200  Weight: 54.4 kg (120 lb)    Intake/Output: No intake/output data recorded.   Intake/Output this shift:  Total I/O In: 500 [IV Piggyback:500] Out: -   Physical Exam: General: NAD, ill appearing  Head: Normocephalic, atraumatic. Moist oral mucosal membranes  Eyes: Anicteric, PERRL  Neck: Supple, trachea midline  Lungs:  Clear to auscultation  Heart: Regular rate and rhythm  Abdomen:  tender  Extremities: no peripheral edema.  Neurologic: Nonfocal, moving all four extremities  Skin: No lesions  Access: Left AVF    Basic Metabolic Panel:  Recent Labs Lab 05/24/2016 0202  NA 139  K 5.2*  CL 103  CO2 24  GLUCOSE 179*  BUN 57*  CREATININE 5.33*  CALCIUM 9.8    Liver Function Tests:  Recent Labs Lab 05/25/2016 0202  AST 17  ALT 14  ALKPHOS 136*  BILITOT 0.5  PROT 7.9  ALBUMIN 4.0    Recent Labs Lab 05/25/2016 0202  LIPASE 39   No results for input(s): AMMONIA in the last 168 hours.  CBC:  Recent Labs Lab 06/01/2016 0202  WBC 7.5  HGB 12.6  HCT 37.5  MCV 99.4  PLT 189    Cardiac Enzymes: No results for input(s): CKTOTAL, CKMB, CKMBINDEX, TROPONINI in the last 168 hours.  BNP: Invalid input(s): POCBNP  CBG: No results for input(s): GLUCAP in the last 168 hours.  Microbiology: Results for orders placed or performed during the hospital encounter of 03/26/16  CULTURE, BLOOD (ROUTINE X 2) w Reflex to ID Panel     Status:  None   Collection Time: 03/26/16  2:07 PM  Result Value Ref Range Status   Specimen Description BLOOD R HAND  Final   Special Requests BOTTLES DRAWN AEROBIC AND ANAEROBIC ANA 6 AER  Final   Culture NO GROWTH 5 DAYS  Final   Report Status 03/31/2016 FINAL  Final  CULTURE, BLOOD (ROUTINE X 2) w Reflex to ID Panel     Status: None   Collection Time: 03/26/16  2:16 PM  Result Value Ref Range Status   Specimen Description BLOOD  R WRIST  Final   Special Requests BOTTLES DRAWN AEROBIC AND ANAEROBIC  ANA 7 AER  Final   Culture NO GROWTH 5 DAYS  Final   Report Status 03/31/2016 FINAL  Final  MRSA PCR Screening     Status: None   Collection Time: 03/27/16  3:03 AM  Result Value Ref Range Status   MRSA by PCR NEGATIVE NEGATIVE Final    Comment:        The GeneXpert MRSA Assay (FDA approved for NASAL specimens only), is one component of a comprehensive MRSA colonization surveillance program. It is not intended to diagnose MRSA infection nor to guide or monitor treatment for MRSA infections.     Coagulation Studies: No results for input(s): LABPROT, INR in the last 72 hours.  Urinalysis: No results for  input(s): COLORURINE, LABSPEC, PHURINE, GLUCOSEU, HGBUR, BILIRUBINUR, KETONESUR, PROTEINUR, UROBILINOGEN, NITRITE, LEUKOCYTESUR in the last 72 hours.  Invalid input(s): APPERANCEUR    Imaging: Ct Abdomen Pelvis Wo Contrast  Result Date: 2017-01-25 CLINICAL DATA:  Abdominal pain for 30 minutes.  Vomiting. EXAM: CT ABDOMEN AND PELVIS WITHOUT CONTRAST TECHNIQUE: Multidetector CT imaging of the abdomen and pelvis was performed following the standard protocol without IV contrast. COMPARISON:  None. FINDINGS: Lower chest: Multi chamber cardiomegaly. There are coronary artery calcifications. Ascending aorta is partially included, however appears dilated measuring 4.3 cm. Trace pericardial effusion. Breathing motion artifact partially obscures evaluation. Hepatobiliary: Motion artifact.  Allowing for this, no focal hepatic lesion. Small volume of perihepatic fluid adjacent to the inferior liver tip. Gallbladder tentatively identified and nondistended. Pancreas: Motion obscured, no peripancreatic inflammation or evidence pancreatitis. Spleen: Normal in size. Adrenals/Urinary Tract: No adrenal nodule. Atrophic kidneys with small cortical cysts. No hydronephrosis. Bladder is minimally distended. Stomach/Bowel: Motion and paucity of intra-abdominal fat limits bowel assessment. Stomach is not well-defined, appears decompressed. Dilated edematous appearing fluid-filled small bowel in the central and left abdomen. Associated mesenteric haziness. Transition point is tentatively identified in the central abdomen image 40 series 2. Distal small bowel loops are decompressed. There is stool distending the rectum with rectal wall thickening. More proximal colon is nondistended, enteric sutures noted in the splenic flexure and possibly hepatic flexures. No pneumatosis. Vascular/Lymphatic: Advanced aortic and branch atherosclerosis. No aneurysm or periaortic soft tissue stranding. Limited assessment for adenopathy given lack of body fat and noncontrast technique. Reproductive: Calcified uterine fibroids.  No gross adnexal mass. Other: Multiple surgical clips throughout the abdomen. Small volume of free fluid in the left and right pericolic gutter. Musculoskeletal: There are no acute or suspicious osseous abnormalities. Chronic changes in the spine. IMPRESSION: 1. Small-bowel obstruction, transition poor tentatively identified in the central abdomen. Mesenteric edema and small volume of free fluid in the pericolic gutters. 2. Advanced vascular calcifications.  No aortic aneurysm. 3. Stool distending the rectum with mild rectal wall thickening, may be chronic constipation. 4. Cardiomegaly noted in the included lower thorax. There is aneurysmal dilatation of the ascending aorta, partially included, maximal dimension  4.3 cm. Recommend annual imaging followup by CTA or MRA. This recommendation follows 2010 ACCF/AHA/AATS/ACR/ASA/SCA/SCAI/SIR/STS/SVM Guidelines for the Diagnosis and Management of Patients with Thoracic Aortic Disease. Circulation. 2010; 121: Z610-R604: [id number] Electronically Signed   By: Rubye OaksMelanie  Ehinger M.D.   On: 02018-10-14 03:33     Medications:    . nitroGLYCERIN  0.5 inch Topical Q6H     Assessment/ Plan:  [name] is a 71 y.o. black female with hypertension, diabetes mellitus type II, hyperlipidemia, thyroid disorder and ESRD on hemodialysis.    [facility] [address]    1. End Stage Renal Disease with hyperkalemia: last dialysis yesterday. No acute indication for dialysis today.  Continue TTS schedule.   2. Anemia of chronic kidney disease: hemoglobin 12.6 - hold epo   3. Secondary Hyperparathyroidism with hyperphosphatemia: PTH 1180, phos 7.9 on 2/13.   4. Hypertension: blood pressure 141/50.     LOS: 0 Solana Coggin 2018-01-1410:32 AM

## 2016-06-11 NOTE — ED Triage Notes (Addendum)
Pt brought in via ems from [facility] with abd pain for 30 minutes.  Pt has n/v.  Hx stroke, dialysis pt  Pt alert.

## 2016-06-11 NOTE — Op Note (Signed)
OPERATIVE NOTE   PROCEDURE: 1. Insertion of single central venous catheter right internal jugular approach.  PRE-OPERATIVE DIAGNOSIS: Small bowel obstruction; dehydration; lack of appropriate IV access  POST-OPERATIVE DIAGNOSIS: Same  SURGEON: Renford DillsGregory G Natika Geyer M.D.  ANESTHESIA: 1% lidocaine local infiltration  ESTIMATED BLOOD LOSS: Minimal cc  INDICATIONS:   [name] is a 71 y.o. female who presents with dehydration and a small bowel obstruction. She is seen in the ER. I have asked to place a line as she has no IV access..  DESCRIPTION: After obtaining full informed written consent, the patient was positioned supine. The right neck was prepped and draped in a sterile fashion. Ultrasound was placed in a sterile sleeve. Ultrasound was utilized to identify the right internal jugular vein which is noted to be echolucent and compressible indicating patency. Images recorded for the permanent record. Under real-time visualization a Seldinger needle is inserted into the vein and the guidewires advanced to 15 cm and then would not advance any further.  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 I have adequate purchase and therefore I will place a single-lumen 5 [place] micro-sheath. Small counterincision was made at the wire insertion site. Micro-sheath with dilator passed over the wire and the single-lumen catheter is fed without difficulty.  The catheter aspirates and flushes easily and are packed with heparin saline. Catheter secured to the skin of the right neck with 2-0 silk. A sterile dressing is applied with Biopatch.  COMPLICATIONS: Likely central venous stenosis I would recommend the patient undergo venography and femoral conversion to a triple-lumen catheter.  CONDITION: Unchanged  Renford DillsGregory G Summar Mcglothlin, M.D. [facility]. Office:  [phone]   2016/11/18,  2:10 PM

## 2016-06-11 NOTE — Consult Note (Signed)
[facility] Vascular Consult Note  MRN : [id number]  Tracy Drake is a 71 y.o. (05/09/1945) female who presents with chief complaint of  Chief Complaint  Patient presents with  . Abdominal Pain  .  History of Present Illness:  I am asked to evaluate the patient by Dr. Clelia CroftShaw for emergent intravenous access in a critically ill patient. The patient is a 71 year old woman who is bedridden with multiple medical problems.  She has a past medical history of stroke, diabetes and end-stage renal disease on dialysis presented to the emergency department complaining of abdominal pain. The patient reported that she has been nauseous and having a bloody nonbilious emesis for 2 days. Initial presentation was significant for clinical dehydration. CT of the abdomen showed a questionable transition point in the middle of the small bowel which could represent obstruction or chronic constipation. Surgical service was consulted who recommended small fluid bolus that may hydrate the intestinal mucosa some more in order to differentiate any obstruction on further imaging. However, the patient has such poor IV access. We have been unable to start intravenous hydration. A central venous catheter was attempted on the left femoral due to contracture of her right leg but she has had some instrumentation of that vessel which prevented catheter placement.  It was at this point I am asked to evaluate for emergent IV access. Of note, the patient is noncommunicative for me.  Current Facility-Administered Medications  Medication Dose Route Frequency Provider Last Rate Last Dose  . acetaminophen (TYLENOL) tablet 650 mg  650 mg Oral Q6H PRN Arnaldo NatalMichael S Diamond, MD       Or  . acetaminophen (TYLENOL) suppository 650 mg  650 mg Rectal Q6H PRN Arnaldo NatalMichael S Diamond, MD      . amLODipine (NORVASC) tablet 2.5 mg  2.5 mg Oral Daily Arnaldo NatalMichael S Diamond, MD      . aspirin EC tablet 81 mg  81 mg Oral Daily Arnaldo NatalMichael S  Diamond, MD      . atorvastatin (LIPITOR) tablet 20 mg  20 mg Oral q1800 Arnaldo NatalMichael S Diamond, MD      . cinacalcet [facility](SENSIPAR) tablet 90 mg  90 mg Oral Q supper Arnaldo NatalMichael S Diamond, MD      . clopidogrel (PLAVIX) tablet 75 mg  75 mg Oral Daily Arnaldo NatalMichael S Diamond, MD      . docusate sodium (COLACE) capsule 100 mg  100 mg Oral BID Arnaldo NatalMichael S Diamond, MD      . feeding supplement (NEPRO CARB STEADY) liquid 237 mL  237 mL Oral TID BM Arnaldo NatalMichael S Diamond, MD      . heparin injection 5,000 Units  5,000 Units Subcutaneous Q8H Arnaldo NatalMichael S Diamond, MD      . latanoprost (XALATAN) 0.005 % ophthalmic solution 1 drop  1 drop Both Eyes QHS Arnaldo NatalMichael S Diamond, MD      . metoprolol tartrate (LOPRESSOR) tablet 25 mg  25 mg Oral BID Arnaldo NatalMichael S Diamond, MD      . nitroGLYCERIN (NITROGLYN) 2 % ointment 0.5 inch  0.5 inch Topical Q6H Arnaldo NatalMichael S Diamond, MD   0.5 inch at 09-16-16 1006  . ondansetron (ZOFRAN) tablet 4 mg  4 mg Oral Q6H PRN Arnaldo NatalMichael S Diamond, MD       Or  . ondansetron [facility](ZOFRAN) injection 4 mg  4 mg Intravenous Q6H PRN Arnaldo NatalMichael S Diamond, MD      . polyethylene glycol (MIRALAX / GLYCOLAX) packet 17 g  17 g Oral Daily  PRN Arnaldo Natal, MD      . sevelamer carbonate (RENVELA) tablet 1,600 mg  1,600 mg Oral TID WC Arnaldo Natal, MD      . sodium chloride flush (NS) 0.9 % injection 3 mL  3 mL Intravenous Q12H Arnaldo Natal, MD      . Melene Muller ON 06/12/2016] venlafaxine XR (EFFEXOR-XR) 24 hr capsule 37.5 mg  37.5 mg Oral Q breakfast Arnaldo Natal, MD        Past Medical History:  Diagnosis Date  . Anemia   . Dementia   . Diabetes mellitus (HCC)    a. not on medications  . Diabetic neuropathy (HCC)   . ESRD (end stage renal disease) on dialysis (HCC)    on dialysis  . Essential hypertension   . History of stroke   . HLD (hyperlipidemia)   . Hyperparathyroidism (HCC)   . Peripheral vascular disease [facility])     Past Surgical History:  Procedure Laterality Date  . AMPUTATION TOE Right 11/02/2015    Procedure: AMPUTATION TOE;  Surgeon: Gwyneth Revels, DPM;  Location: [facility] ORS;  Service: Podiatry;  Laterality: Right;  . PERIPHERAL VASCULAR CATHETERIZATION Right 10/24/2015   Procedure: Lower Extremity Angiography;  Surgeon: Annice Needy, MD;  Location: [facility] INVASIVE CV LAB;  Service: Cardiovascular;  Laterality: Right;  . PERIPHERAL VASCULAR CATHETERIZATION Right 11/01/2015   Procedure: Lower Extremity Angiography;  Surgeon: Annice Needy, MD;  Location: [facility] INVASIVE CV LAB;  Service: Cardiovascular;  Laterality: Right;  . PERIPHERAL VASCULAR CATHETERIZATION  11/01/2015   Procedure: Lower Extremity Intervention;  Surgeon: Annice Needy, MD;  Location: [facility] INVASIVE CV LAB;  Service: Cardiovascular;;  . PERIPHERAL VASCULAR CATHETERIZATION N/A 11/06/2015   Procedure: A/V Shuntogram/Fistulagram;  Surgeon: Renford Dills, MD;  Location: [facility] INVASIVE CV LAB;  Service: Cardiovascular;  Laterality: N/A;  . PERIPHERAL VASCULAR CATHETERIZATION N/A 11/06/2015   Procedure: A/V Shunt Intervention;  Surgeon: Renford Dills, MD;  Location: [facility] INVASIVE CV LAB;  Service: Cardiovascular;  Laterality: N/A;  . PERIPHERAL VASCULAR CATHETERIZATION N/A 11/29/2015   Procedure: Dialysis/Perma Catheter Removal;  Surgeon: Annice Needy, MD;  Location: [facility] INV[facility] CV LAB;  Service: Cardiovascular;  Laterality: N/A;  . PERIPHERAL VASCULAR CATHETERIZATION  03/26/2016   Procedure: Dialysis/Perma Catheter Insertion;  Surgeon: Renford Dills, MD;  Location: [facility] INVASIVE CV LAB;  Service: Cardiovascular;;  . PERIPHERAL VASCULAR CATHETERIZATION Left 03/28/2016   Procedure: Thrombectomy;  Surgeon: Renford Dills, MD;  Location: [facility] INVASIVE CV LAB;  Service: Cardiovascular;  Laterality: Left;    Social History Social History  Substance Use Topics  . Smoking status: Never Smoker  . Smokeless tobacco: Never Used  . Alcohol use No    Family History Family History  Problem Relation Age of Onset  . Hypertension  Mother   No family history of bleeding/clotting disorders, porphyria or autoimmune disease   No Known Allergies   REVIEW OF SYSTEMS (Negative unless checked) patient is not answering questions for me review of systems based on information from the chart  Constitutional: [] Weight loss  [] Fever  [] Chills Cardiac: [] Chest pain   [] Chest pressure   [] Palpitations   [] Shortness of breath when laying flat   [] Shortness of breath at rest   [] Shortness of breath with exertion. Vascular:  [] Pain in legs with walking   [] Pain in legs at rest   [] Pain in legs when laying flat   [] Claudication   [] Pain in feet when walking  [] Pain in feet at  rest  [] Pain in feet when laying flat   [] History of DVT   [] Phlebitis   [] Swelling in legs   [] Varicose veins   [] Non-healing ulcers Pulmonary:   [] Uses home oxygen   [] Productive cough   [] Hemoptysis   [] Wheeze  [] COPD   [] Asthma Neurologic:  [] Dizziness  [] Blackouts   [] Seizures   [x] History of stroke   [] History of TIA  [] Aphasia   [] Temporary blindness   [] Dysphagia   [x] Weakness or numbness in arms   [x] Weakness or numbness in legs Musculoskeletal:  [] Arthritis   [] Joint swelling   [] Joint pain   [] Low back pain Hematologic:  [] Easy bruising  [] Easy bleeding   [] Hypercoagulable state   [] Anemic  [] Hepatitis Gastrointestinal:  [] Blood in stool   [x] Vomiting   [] Gastroesophageal reflux/heartburn   [x] Difficulty swallowing. Genitourinary:  [x] Chronic kidney disease   [] Difficult urination  [] Frequent urination  [] Burning with urination   [] Blood in urine Skin:  [] Rashes   [] Ulcers   [] Wounds Psychological:  [] History of anxiety   []  History of major depression.  Physical Examination  Vitals:   2016-06-26 1041 06/26/2016 1100 Jun 26, 2016 1115 June 26, 2016 1225  BP: (!) 205/67 (!) 157/55 (!) 141/50 (!) 178/51  Pulse:  83  75  Resp:  19 (!) 23 16  Temp:    98.6 F (37 C)  TempSrc:    Oral  SpO2:  96%  100%  Weight:      Height:       Body mass index is 20.6  kg/m. Gen:  WD, Moderate distress she is in the fetal position in bed appears Head: [place]/AT, moderate temporalis wasting. Prominent temp pulse not noted. Ear/Nose/Throat: , nares w/o erythema or drainage, oropharynx very dry tongue quite prominent w/o Erythema/Exudate Eyes: Sclera non-icteric, conjunctiva clear Neck: Trachea midline.  No JVD.  Pulmonary:  Poor air movement, respirations not labored but very shallow, equal bilaterally.  Cardiac: RRR, normal S1, S2. Vascular:  Vessel Right Left  Radial Palpable Palpable  Ulnar Palpable Palpable  Brachial Palpable Palpable  Carotid Palpable, without bruit Palpable, without bruit  Aorta Not palpable N/A  Gastrointestinal: Distended, tender. No guarding/reflex.  Musculoskeletal: M/S 2/5 throughout.  Extremities without ischemic changes.  Profound deformity and atrophy patient chronically bedridden in the fetal position. No edema. Neurologic: Sensation grossly intact in extremities.  Symmetrical.  Speech is not fluent. Motor exam as listed above. Psychiatric: Judgment intact, Mood & affect appropriate for pt's clinical situation. Dermatologic: No rashes or ulcers noted.  No cellulitis or open wounds. Lymph : No Cervical, Axillary, or Inguinal lymphadenopathy.      CBC Lab Results  Component Value Date   WBC 7.5 2016/06/26   HGB 12.6 26-Jun-2016   HCT 37.5 06/26/16   MCV 99.4 26-Jun-2016   PLT 189 06/26/2016    BMET    Component Value Date/Time   NA 139 06-26-16 0202   K 5.2 (H) Jun 26, 2016 0202   CL 103 26-Jun-2016 0202   CO2 24 06-26-2016 0202   GLUCOSE 179 (H) 06/26/16 0202   BUN 57 (H) Jun 26, 2016 0202   CREATININE 5.33 (H) Jun 26, 2016 0202   CALCIUM 9.8 06-26-2016 0202   GFRNONAA 7 (L) Jun 26, 2016 0202   GFRAA 8 (L) 2016-06-26 0202   Estimated Creatinine Clearance: 8.2 mL/min (by C-G formula based on SCr of 5.33 mg/dL (H)).  COAG Lab Results  Component Value Date   INR 1.11 10/23/2015    Radiology Ct Abdomen  Pelvis Wo Contrast  Result Date: 06/26/2016 CLINICAL DATA:  Abdominal pain for 30 minutes.  Vomiting. EXAM: CT ABDOMEN AND PELVIS WITHOUT CONTRAST TECHNIQUE: Multidetector CT imaging of the abdomen and pelvis was performed following the standard protocol without IV contrast. COMPARISON:  None. FINDINGS: Lower chest: Multi chamber cardiomegaly. There are coronary artery calcifications. Ascending aorta is partially included, however appears dilated measuring 4.3 cm. Trace pericardial effusion. Breathing motion artifact partially obscures evaluation. Hepatobiliary: Motion artifact. Allowing for this, no focal hepatic lesion. Small volume of perihepatic fluid adjacent to the inferior liver tip. Gallbladder tentatively identified and nondistended. Pancreas: Motion obscured, no peripancreatic inflammation or evidence pancreatitis. Spleen: Normal in size. Adrenals/Urinary Tract: No adrenal nodule. Atrophic kidneys with small cortical cysts. No hydronephrosis. Bladder is minimally distended. Stomach/Bowel: Motion and paucity of intra-abdominal fat limits bowel assessment. Stomach is not well-defined, appears decompressed. Dilated edematous appearing fluid-filled small bowel in the central and left abdomen. Associated mesenteric haziness. Transition point is tentatively identified in the central abdomen image 40 series 2. Distal small bowel loops are decompressed. There is stool distending the rectum with rectal wall thickening. More proximal colon is nondistended, enteric sutures noted in the splenic flexure and possibly hepatic flexures. No pneumatosis. Vascular/Lymphatic: Advanced aortic and branch atherosclerosis. No aneurysm or periaortic soft tissue stranding. Limited assessment for adenopathy given lack of body fat and noncontrast technique. Reproductive: Calcified uterine fibroids.  No gross adnexal mass. Other: Multiple surgical clips throughout the abdomen. Small volume of free fluid in the left and right  pericolic gutter. Musculoskeletal: There are no acute or suspicious osseous abnormalities. Chronic changes in the spine. IMPRESSION: 1. Small-bowel obstruction, transition poor tentatively identified in the central abdomen. Mesenteric edema and small volume of free fluid in the pericolic gutters. 2. Advanced vascular calcifications.  No aortic aneurysm. 3. Stool distending the rectum with mild rectal wall thickening, may be chronic constipation. 4. Cardiomegaly noted in the included lower thorax. There is aneurysmal dilatation of the ascending aorta, partially included, maximal dimension 4.3 cm. Recommend annual imaging followup by CTA or MRA. This recommendation follows 2010 ACCF/AHA/AATS/ACR/ASA/SCA/SCAI/SIR/STS/SVM Guidelines for the Diagnosis and Management of Patients with Thoracic Aortic Disease. Circulation. 2010; 121: W098-J191 Electronically Signed   By: Rubye Oaks M.D.   On: June 30, 2016 03:33      Assessment/Plan 1. Lack of appropriate IV access in a critically ill patient: Given her flexion contractures femoral access would be exceedingly difficult. By screening ultrasound she appears to have a right jugular vein. I will place a central line with ultrasound guidance right internal jugular approach. 2. Small bowel obstruction: Unclear functional or anatomical obstruction at this time. Follow surgery recommendations regarding hydration keeping in mind the patient also scheduled for dialysis today. Surgery service to follow. Nothing by mouth except for clears and medication. 3. Cerebrovascular disease: Stable; continue aspirin and Plavix. History of old stroke. 4. End-stage renal disease: On dialysis. Potassium also elevated. No arrhythmias. Continue calcium binders as well as Sensipar. 5. Hypertension: Secondary to end-stage renal disease; I placed Nitropaste on her chest. Another option for blood pressure control may be to allow clonidine to dissolve on the patient's tongue. Once IV access  established may use IV hydralazine. May improve with dialysis. Restart oral antihypertensive medication when gut motility restored 6. Glucose intolerance: Diet controlled 7. Hyperlipidemia: Continue statin therapy 8. Depression: Continue Effexor  Levora Dredge, MD  06-30-16 12:25 PM    This note was created with Dragon medical transcription system.  Any error is purely unintentional

## 2016-06-11 NOTE — Progress Notes (Signed)
Patient off the floor to specials for central line placement

## 2016-06-11 NOTE — ED Notes (Signed)
Dr. Sheryle Hailiamond at the bedside.

## 2016-06-11 NOTE — Progress Notes (Signed)
I discussed her case with her legal Guardian [name] at [phone].  I also discussed possible CODE STATUS update with her and she states she is listed as a full code but if we feel appropriate for DNR they are requesting MOST form so that they can discuss this with patient's family.

## 2016-06-11 NOTE — ED Notes (Signed)
Unsuccessful attempt x3 for iv  md aware.

## 2016-06-11 NOTE — Progress Notes (Signed)
[employer] [facility] [place] [facility]   PATIENT NAME: Tracy Drake    MR#:  [id number]  DATE OF BIRTH:  03-10-1946  SUBJECTIVE:  CHIEF COMPLAINT:   Chief Complaint  Patient presents with  . Abdominal Pain  Coiled up in the bed, she just vomited earlier, vomit content is all over her clothes.  Not wanting to talk REVIEW OF SYSTEMS:  Review of Systems  Constitutional: Positive for malaise/fatigue. Negative for chills, fever and weight loss.  HENT: Negative for nosebleeds and sore throat.   Eyes: Negative for blurred vision.  Respiratory: Negative for cough, shortness of breath and wheezing.   Cardiovascular: Negative for chest pain, orthopnea, leg swelling and PND.  Gastrointestinal: Positive for abdominal pain, nausea and vomiting. Negative for constipation, diarrhea and heartburn.  Genitourinary: Negative for dysuria and urgency.  Musculoskeletal: Negative for back pain.  Skin: Negative for rash.  Neurological: Positive for weakness. Negative for dizziness, speech change, focal weakness and headaches.  Endo/Heme/Allergies: Does not bruise/bleed easily.  Psychiatric/Behavioral: Negative for depression.    DRUG ALLERGIES:  No Known Allergies VITALS:  Blood pressure (!) 178/51, pulse 75, temperature 98.6 F (37 C), temperature source Oral, resp. rate 16, height 5\' 4"  (1.626 m), weight 54.4 kg (120 lb), SpO2 100 %. PHYSICAL EXAMINATION:  Physical Exam  Constitutional: She appears lethargic, malnourished and dehydrated. She appears unhealthy. She appears toxic. She has a sickly appearance.  HENT:  Head: Normocephalic and atraumatic.  Eyes: Conjunctivae and EOM are normal. Pupils are equal, round, and reactive to light.  Neck: Normal range of motion. Neck supple. No tracheal deviation present. No thyromegaly present.  Cardiovascular: Normal rate, regular rhythm and normal heart sounds.   Pulmonary/Chest: Effort normal and breath sounds normal. No respiratory  distress. She has no wheezes. She exhibits no tenderness.  Abdominal: Soft. Bowel sounds are normal. She exhibits no distension. There is generalized tenderness.  Musculoskeletal: Normal range of motion.  Neurological: She appears lethargic. No cranial nerve deficit.  Skin: Skin is warm and dry. No rash noted.  Psychiatric: Mood and affect normal.   LABORATORY PANEL:  Female CBC  Recent Labs Lab 05-Jul-2016 0202  WBC 7.5  HGB 12.6  HCT 37.5  PLT 189   ------------------------------------------------------------------------------------------------------------------ Chemistries   Recent Labs Lab 07-05-2016 0202  NA 139  K 5.2*  CL 103  CO2 24  GLUCOSE 179*  BUN 57*  CREATININE 5.33*  CALCIUM 9.8  AST 17  ALT 14  ALKPHOS 136*  BILITOT 0.5   RADIOLOGY:  Ct Abdomen Pelvis Wo Contrast  Result Date: 07/05/16 CLINICAL DATA:  Abdominal pain for 30 minutes.  Vomiting. EXAM: CT ABDOMEN AND PELVIS WITHOUT CONTRAST TECHNIQUE: Multidetector CT imaging of the abdomen and pelvis was performed following the standard protocol without IV contrast. COMPARISON:  None. FINDINGS: Lower chest: Multi chamber cardiomegaly. There are coronary artery calcifications. Ascending aorta is partially included, however appears dilated measuring 4.3 cm. Trace pericardial effusion. Breathing motion artifact partially obscures evaluation. Hepatobiliary: Motion artifact. Allowing for this, no focal hepatic lesion. Small volume of perihepatic fluid adjacent to the inferior liver tip. Gallbladder tentatively identified and nondistended. Pancreas: Motion obscured, no peripancreatic inflammation or evidence pancreatitis. Spleen: Normal in size. Adrenals/Urinary Tract: No adrenal nodule. Atrophic kidneys with small cortical cysts. No hydronephrosis. Bladder is minimally distended. Stomach/Bowel: Motion and paucity of intra-abdominal fat limits bowel assessment. Stomach is not well-defined, appears decompressed. Dilated  edematous appearing fluid-filled small bowel in the central and left abdomen. Associated mesenteric  haziness. Transition point is tentatively identified in the central abdomen image 40 series 2. Distal small bowel loops are decompressed. There is stool distending the rectum with rectal wall thickening. More proximal colon is nondistended, enteric sutures noted in the splenic flexure and possibly hepatic flexures. No pneumatosis. Vascular/Lymphatic: Advanced aortic and branch atherosclerosis. No aneurysm or periaortic soft tissue stranding. Limited assessment for adenopathy given lack of body fat and noncontrast technique. Reproductive: Calcified uterine fibroids.  No gross adnexal mass. Other: Multiple surgical clips throughout the abdomen. Small volume of free fluid in the left and right pericolic gutter. Musculoskeletal: There are no acute or suspicious osseous abnormalities. Chronic changes in the spine. IMPRESSION: 1. Small-bowel obstruction, transition poor tentatively identified in the central abdomen. Mesenteric edema and small volume of free fluid in the pericolic gutters. 2. Advanced vascular calcifications.  No aortic aneurysm. 3. Stool distending the rectum with mild rectal wall thickening, may be chronic constipation. 4. Cardiomegaly noted in the included lower thorax. There is aneurysmal dilatation of the ascending aorta, partially included, maximal dimension 4.3 cm. Recommend annual imaging followup by CTA or MRA. This recommendation follows 2010 ACCF/AHA/AATS/ACR/ASA/SCA/SCAI/SIR/STS/SVM Guidelines for the Diagnosis and Management of Patients with Thoracic Aortic Disease. Circulation. 2010; 121: Z610-R604: [id number] Electronically Signed   By: Rubye OaksMelanie  Ehinger M.D.   On: 05/25/2016 03:33   ASSESSMENT AND PLAN:  This is a 71 year old female admitted for small bowel obstruction.  * Small bowel obstruction:  - Nothing by mouth except for clears and medication. -Conservative management for now -Await  surgical consultation -discussed with Dr. Tonita CongWoodham  *Hyperkalemia: should resolve with treatment of dialysis  *Uncontrolled hypertension: Secondary to end-stage renal disease;  -Start IV hydralazine.  Blood pressure may improve with dialysis treatment.  -Can restart oral antihypertensive medication when gut motility restored  * Cerebrovascular disease: Stable; continue aspirin and Plavix. History of old stroke.  * End-stage renal disease: On dialysis. Potassium also elevated. No arrhythmias. Continue calcium binders as well as Sensipar. -Nephrology following * Depression: Continue Effexor * Glucose intolerance: Diet controlled * Hyperlipidemia: Continue statin therapy   . Overall poor prognosis    All the records are reviewed and case discussed with Care Management/Social Worker. Management plans discussed with the patient, nursing and they are in agreement.  CODE STATUS: Full Code - She has been DO NOT RESUSCITATE in the past; palliative care consult to address goals of care  TOTAL TIME TAKING CARE OF THIS PATIENT: 25 minutes.   More than 50% of the time was spent in counseling/coordination of care: YES  POSSIBLE D/C IN 2-3 DAYS, DEPENDING ON CLINICAL CONDITION.   Delfino LovettVipul Kolbe Delmonaco M.D on 05/18/2016 at 3:42 PM  Between 7am to 6pm - Pager - [phone]  After 6pm go to [email or web address] - Social research officer, governmentpassword [email or web address]  [facility] [place] [facility]  Office  [phone]  CC: Primary care physician; Pcp Not In System  Note: This dictation was prepared with Dragon dictation along with smaller phrase technology. Any transcriptional errors that result from this process are unintentional.

## 2016-06-11 NOTE — [place] FL2 (Signed)
[place] MEDICAID FL2 LEVEL OF CARE SCREENING TOOL     IDENTIFICATION  Patient Name: Tracy Drake Birthdate: 07/06/1945 Sex: female Admission Date (Current Location): 05/30/2016  [place] and [place] Number:  Chiropodist and Address:  [facility], [address] [place], [place] [address]      Provider Number: [id number]  Attending Physician Name and Address:  Delfino Lovett, MD  Relative Name and Phone Number:  APS ([place] [facility]:  [phone]    Current Level of Care: Hospital Recommended Level of Care: Skilled Nursing Facility Prior Approval Number:    Date Approved/Denied:   PASRR Number:    Discharge Plan: SNF    Current Diagnoses: Patient Active Problem List   Diagnosis Date Noted  . SBO (small bowel obstruction) 06/04/2016  . Peripheral artery disease (HCC) 11/07/2015  . Essential hypertension, malignant 11/07/2015  . Hyperglycemia 11/07/2015  . Generalized weakness 11/07/2015  . Left upper extremity swelling 11/07/2015  . DNR (do not resuscitate) discussion 10/31/2015  . Palliative care encounter 10/31/2015  . Pressure ulcer 10/24/2015  . ESRD (end stage renal disease) (HCC) 10/22/2015  . Hyperkalemia 10/22/2015  . Toe osteomyelitis, right (HCC) 10/22/2015  . Acute posthemorrhagic anemia 10/22/2015  . Osteomyelitis (HCC) 10/22/2015    Orientation RESPIRATION BLADDER Height & Weight     Self, Place  Normal Continent Weight: 120 lb (54.4 kg) Height:  5\' 4"  (162.6 cm)  BEHAVIORAL SYMPTOMS/MOOD NEUROLOGICAL BOWEL NUTRITION STATUS      Continent Diet  AMBULATORY STATUS COMMUNICATION OF NEEDS Skin   Extensive Assist Verbally Normal                       Personal Care Assistance Level of Assistance  Bathing, Feeding, Dressing Bathing Assistance: Limited assistance Feeding assistance: Limited assistance Dressing Assistance: Limited assistance     Functional Limitations Info  Sight, Hearing,  Speech Sight Info: Adequate Hearing Info: Adequate Speech Info: Adequate    SPECIAL CARE FACTORS FREQUENCY  PT (By licensed PT), OT (By licensed OT)     PT Frequency: 5 OT Frequency: 5            Contractures Contractures Info: Not present    Additional Factors Info  Code Status, Allergies Code Status Info: Full Code Allergies Info: NKA           Current Medications (05/16/2016):  This is the current hospital active medication list Current Facility-Administered Medications  Medication Dose Route Frequency Provider Last Rate Last Dose  . nitroGLYCERIN (NITROGLYN) 2 % ointment 0.5 inch  0.5 inch Topical Q6H Arnaldo Natal, MD   0.5 inch at 05/19/2016 1006   Current Outpatient Prescriptions  Medication Sig Dispense Refill  . amLODipine (NORVASC) 2.5 MG tablet Take 1 tablet (2.5 mg total) by mouth daily. 30 tablet 5  . aspirin EC 81 MG tablet Take 81 mg by mouth daily.    [name] atorvastatin (LIPITOR) 20 MG tablet Take 20 mg by mouth daily at 6 PM.    . cinacalcet (SENSIPAR) 90 MG tablet Take 90 mg by mouth daily with supper.    . clopidogrel (PLAVIX) 75 MG tablet Take 1 tablet (75 mg total) by mouth daily. 30 tablet 6  . docusate sodium (COLACE) 100 MG capsule Take 1 capsule (100 mg total) by mouth 2 (two) times daily. 10 capsule 0  . HYDROcodone-acetaminophen (NORCO/VICODIN) 5-325 MG tablet Take 1-2 tablets by mouth every 4 (four) hours as needed for moderate  pain or severe pain. 30 tablet 0  . latanoprost (XALATAN) 0.005 % ophthalmic solution 1 drop at bedtime.    . metoprolol tartrate (LOPRESSOR) 25 MG tablet Take 1 tablet (25 mg total) by mouth 2 (two) times daily. 60 tablet 6  . Nutritional Supplements (FEEDING SUPPLEMENT, NEPRO CARB STEADY,) LIQD Take 237 mLs by mouth 3 (three) times daily between meals. 90 Can 6  . polyethylene glycol (MIRALAX / GLYCOLAX) packet Take 17 g by mouth daily as needed for mild constipation. 14 each 0  . senna (SENOKOT) 8.6 MG TABS tablet Take 1  tablet (8.6 mg total) by mouth 2 (two) times daily. 120 each 0  . sevelamer carbonate (RENVELA) 800 MG tablet Take 1,600 mg by mouth 3 (three) times daily with meals.    . venlafaxine XR (EFFEXOR-XR) 37.5 MG 24 hr capsule Take 37.5 mg by mouth daily with breakfast.    . mupirocin ointment (BACTROBAN) 2 % Place into the nose daily. (Patient not taking: Reported on 03/26/2016) 22 g 0     Discharge Medications: Please see discharge summary for a list of discharge medications.  Relevant Imaging Results:  Relevant Lab Results:   Additional Information Dialysis:  Tues, th, saturday.    SSN: [id number]  Raye Sorrowoble, Teanna Elem N, LCSW

## 2016-06-11 NOTE — ED Notes (Signed)
Per Iris RN, unable to obtain IV access, multiple attempts.[name]. PICC team called..[name]

## 2016-06-11 NOTE — ED Notes (Signed)
Spoke with nephrologist, states pt needs a central line, does not want the pt to have a PICC placed and will contact vascular surgeon.

## 2016-06-12 LAB — BASIC METABOLIC PANEL
Anion gap: 16 — ABNORMAL HIGH (ref 5–15)
BUN: 78 mg/dL — AB (ref 6–20)
CHLORIDE: 104 mmol/L (ref 101–111)
CO2: 22 mmol/L (ref 22–32)
Calcium: 10.8 mg/dL — ABNORMAL HIGH (ref 8.9–10.3)
Creatinine, Ser: 7.35 mg/dL — ABNORMAL HIGH (ref 0.44–1.00)
GFR calc Af Amer: 6 mL/min — ABNORMAL LOW (ref >60)
GFR calc non Af Amer: 5 mL/min — ABNORMAL LOW (ref >60)
GLUCOSE: 186 mg/dL — AB (ref 65–99)
POTASSIUM: 6.7 mmol/L — AB (ref 3.5–5.1)
Sodium: 142 mmol/L (ref 135–145)

## 2016-06-12 LAB — CBC
HCT: 37.2 % (ref 35.0–47.0)
HEMOGLOBIN: 12.3 g/dL (ref 12.0–16.0)
MCH: 33.1 pg (ref 26.0–34.0)
MCHC: 33.1 g/dL (ref 32.0–36.0)
MCV: 99.8 fL (ref 80.0–100.0)
Platelets: 190 10*3/uL (ref 150–440)
RBC: 3.73 MIL/uL — AB (ref 3.80–5.20)
RDW: 14.1 % (ref 11.5–14.5)
WBC: 7.3 10*3/uL (ref 3.6–11.0)

## 2016-06-12 LAB — HEMOGLOBIN A1C
Hgb A1c MFr Bld: 4.6 % — ABNORMAL LOW (ref 4.8–5.6)
Mean Plasma Glucose: 85 mg/dL

## 2016-06-12 LAB — GLUCOSE, CAPILLARY: Glucose-Capillary: 175 mg/dL — ABNORMAL HIGH (ref 65–99)

## 2016-06-12 LAB — T3, FREE: T3, Free: 5.1 pg/mL — ABNORMAL HIGH (ref 2.0–4.4)

## 2016-06-12 MED ORDER — SODIUM POLYSTYRENE SULFONATE 15 GM/60ML PO SUSP: 30.0000 g | Freq: Once | ORAL | Status: DC

## 2016-06-12 NOTE — Progress Notes (Signed)
Hemodialysis treatment started without complications. 

## 2016-06-12 NOTE — Progress Notes (Signed)
Post Hemodiaysis:  Experienced asymptomatic hypotension during treatment,corrected with uf goal decrease and saline bolus.Dr.Kolluru aware during his rounds.No fluid removal with treatment.3.5hour completed.Hemastasis achieved,sites dressed with gauze/taped.

## 2016-06-12 NOTE — Progress Notes (Signed)
Hospitalist Dr. Tobi BastosPyreddy notified of critical K+ 6.7; acknowledged. Windy Carinaurner,Makail Watling K, RN 6:54 AM 06/12/2016

## 2016-06-12 NOTE — Progress Notes (Signed)
[employer] [facility] [place] [facility]   PATIENT NAME: Tracy Drake    MR#:  [id number]  DATE OF BIRTH:  [birth year]  SUBJECTIVE:  CHIEF COMPLAINT:   Chief Complaint  Patient presents with  . Abdominal Pain  Wants to take clear liquids, potassium 6.7, not feeling well. REVIEW OF SYSTEMS:  Review of Systems  Constitutional: Positive for malaise/fatigue. Negative for chills, fever and weight loss.  HENT: Negative for nosebleeds and sore throat.   Eyes: Negative for blurred vision.  Respiratory: Negative for cough, shortness of breath and wheezing.   Cardiovascular: Negative for chest pain, orthopnea, leg swelling and PND.  Gastrointestinal: Positive for abdominal pain, nausea and vomiting. Negative for constipation, diarrhea and heartburn.  Genitourinary: Negative for dysuria and urgency.  Musculoskeletal: Negative for back pain.  Skin: Negative for rash.  Neurological: Positive for weakness. Negative for dizziness, speech change, focal weakness and headaches.  Endo/Heme/Allergies: Does not bruise/bleed easily.  Psychiatric/Behavioral: Negative for depression.   DRUG ALLERGIES:  No Known Allergies VITALS:  Blood pressure (!) 148/54, pulse 88, temperature 97.7 F (36.5 C), resp. rate 18, height 5\' 4"  (1.626 m), weight 49.8 kg (109 lb 12.8 oz), SpO2 100 %. PHYSICAL EXAMINATION:  Physical Exam  Constitutional: She appears lethargic, malnourished and dehydrated. She appears unhealthy. She appears toxic. She has a sickly appearance.  HENT:  Head: Normocephalic and atraumatic.  Eyes: Conjunctivae and EOM are normal. Pupils are equal, round, and reactive to light.  Neck: Normal range of motion. Neck supple. No tracheal deviation present. No thyromegaly present.  Cardiovascular: Normal rate, regular rhythm and normal heart sounds.   Pulmonary/Chest: Effort normal and breath sounds normal. No respiratory distress. She has no wheezes. She exhibits no tenderness.    Abdominal: Soft. Bowel sounds are normal. She exhibits no distension. There is generalized tenderness.  Musculoskeletal: Normal range of motion.  Neurological: She appears lethargic. No cranial nerve deficit.  Skin: Skin is warm and dry. No rash noted.  Psychiatric: Mood and affect normal.   LABORATORY PANEL:  Female CBC  Recent Labs Lab 06/12/16 0450  WBC 7.3  HGB 12.3  HCT 37.2  PLT 190   ------------------------------------------------------------------------------------------------------------------ Chemistries   Recent Labs Lab 06/09/2016 0202 06/12/16 0450  NA 139 142  K 5.2* 6.7*  CL 103 104  CO2 24 22  GLUCOSE 179* 186*  BUN 57* 78*  CREATININE 5.33* 7.35*  CALCIUM 9.8 10.8*  AST 17  --   ALT 14  --   ALKPHOS 136*  --   BILITOT 0.5  --    RADIOLOGY:  No results found. ASSESSMENT AND PLAN:  This is a 71 year old female admitted for small bowel obstruction.  * Small bowel obstruction:  - Nothing by mouth except for clears and medication. - Appreciate Dr. Tonita Cong.  Recommend conservative management -If she continues to have nausea/vomiting -we can consider putting NG tube  *Hyperkalemia: Potassium 6.7, should resolve with treatment of dialysis.  Discuss with nephrology.  Getting ready for dialysis today  *Uncontrolled hypertension: Secondary to end-stage renal disease;  - continue IV hydralazine.  Blood pressure much better controlled today  * Cerebrovascular disease: Stable; continue aspirin and Plavix. History of old stroke.  * End-stage renal disease: On dialysis. Potassium also elevated. No arrhythmias. Continue calcium binders as well as Sensipar. -Nephrology following, plan for dialysis today  * Depression: Continue Effexor * Glucose intolerance: Diet controlled * Hyperlipidemia: Continue statin therapy   Overall poor prognosis -I discussed with legal guardian  yesterday to consider updating the CODE STATUS and/or discussion with palliative care  for long-term goals of care    All the records are reviewed and case discussed with Care Management/Social Worker. Management plans discussed with the patient, nursing and they are in agreement.  CODE STATUS: Full Code - She has been DO NOT RESUSCITATE in the past; palliative care consult to address goals of care  TOTAL TIME TAKING CARE OF THIS PATIENT: 25 minutes.   More than 50% of the time was spent in counseling/coordination of care: YES  POSSIBLE D/C IN 2-3 DAYS, DEPENDING ON CLINICAL CONDITION.   Delfino LovettVipul Asif Muchow M.D on 06/12/2016 at 9:39 AM  Between 7am to 6pm - Pager - [phone]  After 6pm go to [email or web address] - Social research officer, governmentpassword [email or web address] [email or web address]  [facility] [place] [facility]  Office  [phone]  CC: Primary care physician; Pcp Not In System  Note: This dictation was prepared with Dragon dictation along with smaller phrase technology. Any transcriptional errors that result from this process are unintentional.

## 2016-06-12 NOTE — Progress Notes (Signed)
Hemodialysis completed. 

## 2016-06-12 NOTE — Progress Notes (Signed)
[facility]  ROUNDING NOTE   Subjective:   Seen and examined on hemodialysis.   UF of 0.5 Litres  Objective:  Vital signs in last 24 hours:  Temp:  [97.7 F (36.5 C)-99 F (37.2 C)] 99 F (37.2 C) (03/01 0940) Pulse Rate:  [66-94] 83 (03/01 0955) Resp:  [15-23] 17 (03/01 0955) BP: (141-205)/(45-67) 152/67 (03/01 0940) SpO2:  [96 %-100 %] 98 % (03/01 0955) Weight:  [49.8 kg (109 lb 12.8 oz)] 49.8 kg (109 lb 12.8 oz) (03/01 0940)  Weight change: -4.627 kg (-10 lb 3.2 oz) Filed Weights   2016-06-13 0200 06/12/16 0500 06/12/16 0940  Weight: 54.4 kg (120 lb) 49.8 kg (109 lb 12.8 oz) 49.8 kg (109 lb 12.8 oz)    Intake/Output: I/O last 3 completed shifts: In: 503 [I.V.:3; IV Piggyback:500] Out: 0    Intake/Output this shift:  No intake/output data recorded.  Physical Exam: General: NAD, ill appearing  Head: Normocephalic, atraumatic. Moist oral mucosal membranes  Eyes: Anicteric, PERRL  Neck: Supple, trachea midline  Lungs:  Clear to auscultation  Heart: Regular rate and rhythm  Abdomen:  tender  Extremities: no peripheral edema.  Neurologic: Not alert or oriented, moving all four extremities  Skin: No lesions  Access: Left AVF    Basic Metabolic Panel:  Recent Labs Lab 2016/06/13 0202 06/12/16 0450  NA 139 142  K 5.2* 6.7*  CL 103 104  CO2 24 22  GLUCOSE 179* 186*  BUN 57* 78*  CREATININE 5.33* 7.35*  CALCIUM 9.8 10.8*    Liver Function Tests:  Recent Labs Lab 06/13/2016 0202  AST 17  ALT 14  ALKPHOS 136*  BILITOT 0.5  PROT 7.9  ALBUMIN 4.0    Recent Labs Lab 2016/06/13 0202  LIPASE 39   No results for input(s): AMMONIA in the last 168 hours.  CBC:  Recent Labs Lab 2016/06/13 0202 06/12/16 0450  WBC 7.5 7.3  HGB 12.6 12.3  HCT 37.5 37.2  MCV 99.4 99.8  PLT 189 190    Cardiac Enzymes: No results for input(s): CKTOTAL, CKMB, CKMBINDEX, TROPONINI in the last 168 hours.  BNP: Invalid input(s): POCBNP  CBG: No results  for input(s): GLUCAP in the last 168 hours.  Microbiology: Results for orders placed or performed during the hospital encounter of 13-Jun-2016  MRSA PCR Screening     Status: None   Collection Time: 06-13-16  4:47 PM  Result Value Ref Range Status   MRSA by PCR NEGATIVE NEGATIVE Final    Comment:        The GeneXpert MRSA Assay (FDA approved for NASAL specimens only), is one component of a comprehensive MRSA colonization surveillance program. It is not intended to diagnose MRSA infection nor to guide or monitor treatment for MRSA infections.     Coagulation Studies: No results for input(s): LABPROT, INR in the last 72 hours.  Urinalysis: No results for input(s): COLORURINE, LABSPEC, PHURINE, GLUCOSEU, HGBUR, BILIRUBINUR, KETONESUR, PROTEINUR, UROBILINOGEN, NITRITE, LEUKOCYTESUR in the last 72 hours.  Invalid input(s): APPERANCEUR    Imaging: Ct Abdomen Pelvis Wo Contrast  Result Date: 2016/06/13 CLINICAL DATA:  Abdominal pain for 30 minutes.  Vomiting. EXAM: CT ABDOMEN AND PELVIS WITHOUT CONTRAST TECHNIQUE: Multidetector CT imaging of the abdomen and pelvis was performed following the standard protocol without IV contrast. COMPARISON:  None. FINDINGS: Lower chest: Multi chamber cardiomegaly. There are coronary artery calcifications. Ascending aorta is partially included, however appears dilated measuring 4.3 cm. Trace pericardial effusion. Breathing motion artifact partially obscures  evaluation. Hepatobiliary: Motion artifact. Allowing for this, no focal hepatic lesion. Small volume of perihepatic fluid adjacent to the inferior liver tip. Gallbladder tentatively identified and nondistended. Pancreas: Motion obscured, no peripancreatic inflammation or evidence pancreatitis. Spleen: Normal in size. Adrenals/Urinary Tract: No adrenal nodule. Atrophic kidneys with small cortical cysts. No hydronephrosis. Bladder is minimally distended. Stomach/Bowel: Motion and paucity of intra-abdominal  fat limits bowel assessment. Stomach is not well-defined, appears decompressed. Dilated edematous appearing fluid-filled small bowel in the central and left abdomen. Associated mesenteric haziness. Transition point is tentatively identified in the central abdomen image 40 series 2. Distal small bowel loops are decompressed. There is stool distending the rectum with rectal wall thickening. More proximal colon is nondistended, enteric sutures noted in the splenic flexure and possibly hepatic flexures. No pneumatosis. Vascular/Lymphatic: Advanced aortic and branch atherosclerosis. No aneurysm or periaortic soft tissue stranding. Limited assessment for adenopathy given lack of body fat and noncontrast technique. Reproductive: Calcified uterine fibroids.  No gross adnexal mass. Other: Multiple surgical clips throughout the abdomen. Small volume of free fluid in the left and right pericolic gutter. Musculoskeletal: There are no acute or suspicious osseous abnormalities. Chronic changes in the spine. IMPRESSION: 1. Small-bowel obstruction, transition poor tentatively identified in the central abdomen. Mesenteric edema and small volume of free fluid in the pericolic gutters. 2. Advanced vascular calcifications.  No aortic aneurysm. 3. Stool distending the rectum with mild rectal wall thickening, may be chronic constipation. 4. Cardiomegaly noted in the included lower thorax. There is aneurysmal dilatation of the ascending aorta, partially included, maximal dimension 4.3 cm. Recommend annual imaging followup by CTA or MRA. This recommendation follows 2010 ACCF/AHA/AATS/ACR/ASA/SCA/SCAI/SIR/STS/SVM Guidelines for the Diagnosis and Management of Patients with Thoracic Aortic Disease. Circulation. 2010; 121: Y782-N562: [id number] Electronically Signed   By: Rubye OaksMelanie  Ehinger M.D.   On: 05/18/2016 03:33     Medications:    . amLODipine  2.5 mg Oral Daily  . aspirin EC  81 mg Oral Daily  . atorvastatin  20 mg Oral q1800  .  cinacalcet  90 mg Oral Q supper  . clopidogrel  75 mg Oral Daily  . docusate sodium  100 mg Oral BID  . feeding supplement (NEPRO CARB STEADY)  237 mL Oral TID BM  . heparin  5,000 Units Subcutaneous Q8H  . hydrALAZINE  10 mg Intravenous TID  . latanoprost  1 drop Both Eyes QHS  . metoprolol tartrate  25 mg Oral BID  . nitroGLYCERIN  0.5 inch Topical Q6H  . sevelamer carbonate  1,600 mg Oral TID WC  . sodium chloride flush  3 mL Intravenous Q12H  . sodium polystyrene  30 g Oral Once  . venlafaxine XR  37.5 mg Oral Q breakfast     Assessment/ Plan:  [name] is a 71 y.o. black female with hypertension, diabetes mellitus type II, hyperlipidemia, thyroid disorder and ESRD on hemodialysis.    [facility] [address]    1. End Stage Renal Disease with hyperkalemia: seen and examined on hemodialysis. TTS schedule  2. Anemia of chronic kidney disease: hemoglobin 12.3 - hold epo   3. Secondary Hyperparathyroidism with hyperphosphatemia: PTH 1180, phos 7.9 on 2/13.  - cinacalcet ordered - sevelamer  4. Hypertension: blood pressure at goal.  - amlodipine and metoprolol.     LOS: 1 Benyamin Jeff 3/1/201810:10 AM

## 2016-06-12 NOTE — Progress Notes (Signed)
Pre-hd tx 

## 2016-06-12 NOTE — Progress Notes (Signed)
Post hd tx 

## 2016-06-12 NOTE — Progress Notes (Signed)
On-call Hospitalist paged for critical K+ 6.7; awaiting call back. Windy Carinaurner,Percell Lamboy K, RN 06/12/2016 6:48 AM

## 2016-06-12 NOTE — Progress Notes (Signed)
  Patient seen and reexamined after dialysis today.  Abdomen soft and nondistended. No outward signs of a mechanical bowel obstruction.  Please call the general surgery service can be of any further assistance with this patient's care.  Ricarda Frameharles Orian Amberg, MD [facility] General Surgeon [facility]  Day [facility] [phone](7a-7p) [phone] Night ASCOM [phone](7p-7a) [phone]

## 2016-06-12 NOTE — Care Management (Signed)
Tracy ChyleAmanda Drake HD liaison notified of admission.  [facility] [address] 1st shift.

## 2016-06-12 DEATH — deceased

## 2016-06-13 ENCOUNTER — Inpatient Hospital Stay: Payer: Medicare Other

## 2016-06-13 DIAGNOSIS — R6521 Severe sepsis with septic shock: Secondary | ICD-10-CM

## 2016-06-13 DIAGNOSIS — Z7189 Other specified counseling: Secondary | ICD-10-CM

## 2016-06-13 DIAGNOSIS — J9600 Acute respiratory failure, unspecified whether with hypoxia or hypercapnia: Secondary | ICD-10-CM

## 2016-06-13 DIAGNOSIS — Z515 Encounter for palliative care: Secondary | ICD-10-CM

## 2016-06-13 DIAGNOSIS — A419 Sepsis, unspecified organism: Secondary | ICD-10-CM

## 2016-06-13 LAB — TROPONIN I
TROPONIN I: 0.04 ng/mL — AB (ref <0.03)
Troponin I: 0.16 ng/mL (ref <0.03)
Troponin I: 1.46 ng/mL (ref <0.03)
Troponin I: 1.75 ng/mL (ref <0.03)

## 2016-06-13 LAB — BLOOD GAS, ARTERIAL
Acid-Base Excess: 1.4 mmol/L (ref 0.0–2.0)
BICARBONATE: 24.4 mmol/L (ref 20.0–28.0)
FIO2: 0.5
MECHVT: 450 mL
O2 SAT: 99.6 %
PATIENT TEMPERATURE: 37
PEEP/CPAP: 5 cmH2O
PO2 ART: 171 mmHg — AB (ref 83.0–108.0)
RATE: 16 resp/min
pCO2 arterial: 32 mmHg (ref 32.0–48.0)
pH, Arterial: 7.49 — ABNORMAL HIGH (ref 7.350–7.450)

## 2016-06-13 LAB — MAGNESIUM: Magnesium: 2.2 mg/dL (ref 1.7–2.4)

## 2016-06-13 LAB — MRSA PCR SCREENING: MRSA by PCR: NEGATIVE

## 2016-06-13 LAB — CBC
HEMATOCRIT: 30.2 % — AB (ref 35.0–47.0)
HEMOGLOBIN: 9.4 g/dL — AB (ref 12.0–16.0)
MCH: 32.4 pg (ref 26.0–34.0)
MCHC: 31.3 g/dL — AB (ref 32.0–36.0)
MCV: 103.6 fL — ABNORMAL HIGH (ref 80.0–100.0)
Platelets: 166 10*3/uL (ref 150–440)
RBC: 2.91 MIL/uL — AB (ref 3.80–5.20)
RDW: 14.4 % (ref 11.5–14.5)
WBC: 8 10*3/uL (ref 3.6–11.0)

## 2016-06-13 LAB — LACTIC ACID, PLASMA
Lactic Acid, Venous: 7.3 mmol/L (ref 0.5–1.9)
Lactic Acid, Venous: 9 mmol/L (ref 0.5–1.9)
Lactic Acid, Venous: 10.4 mmol/L (ref 0.5–1.9)

## 2016-06-13 LAB — COMPREHENSIVE METABOLIC PANEL
ALBUMIN: 2.7 g/dL — AB (ref 3.5–5.0)
ALT: 229 U/L — AB (ref 14–54)
AST: 186 U/L — ABNORMAL HIGH (ref 15–41)
Alkaline Phosphatase: 71 U/L (ref 38–126)
Anion gap: 18 — ABNORMAL HIGH (ref 5–15)
BILIRUBIN TOTAL: 0.4 mg/dL (ref 0.3–1.2)
BUN: 43 mg/dL — ABNORMAL HIGH (ref 6–20)
CALCIUM: 9.1 mg/dL (ref 8.9–10.3)
CO2: 21 mmol/L — ABNORMAL LOW (ref 22–32)
CREATININE: 4.82 mg/dL — AB (ref 0.44–1.00)
Chloride: 101 mmol/L (ref 101–111)
GFR calc Af Amer: 10 mL/min — ABNORMAL LOW (ref >60)
GFR calc non Af Amer: 8 mL/min — ABNORMAL LOW (ref >60)
GLUCOSE: 171 mg/dL — AB (ref 65–99)
Potassium: 3.9 mmol/L (ref 3.5–5.1)
SODIUM: 140 mmol/L (ref 135–145)
TOTAL PROTEIN: 5.7 g/dL — AB (ref 6.5–8.1)

## 2016-06-13 LAB — GLUCOSE, CAPILLARY
Glucose-Capillary: 110 mg/dL — ABNORMAL HIGH (ref 65–99)
Glucose-Capillary: 117 mg/dL — ABNORMAL HIGH (ref 65–99)
Glucose-Capillary: 125 mg/dL — ABNORMAL HIGH (ref 65–99)
Glucose-Capillary: 43 mg/dL — CL (ref 65–99)
Glucose-Capillary: 71 mg/dL (ref 65–99)
Glucose-Capillary: <10 mg/dL — CL (ref 65–99)

## 2016-06-13 LAB — HEPATITIS B SURFACE ANTIGEN: HEP B S AG: NEGATIVE

## 2016-06-13 LAB — BASIC METABOLIC PANEL
ANION GAP: 17 — AB (ref 5–15)
BUN: 40 mg/dL — ABNORMAL HIGH (ref 6–20)
CALCIUM: 10.5 mg/dL — AB (ref 8.9–10.3)
CO2: 28 mmol/L (ref 22–32)
Chloride: 93 mmol/L — ABNORMAL LOW (ref 101–111)
Creatinine, Ser: 5.19 mg/dL — ABNORMAL HIGH (ref 0.44–1.00)
GFR calc Af Amer: 9 mL/min — ABNORMAL LOW (ref >60)
GFR calc non Af Amer: 8 mL/min — ABNORMAL LOW (ref >60)
GLUCOSE: 148 mg/dL — AB (ref 65–99)
Potassium: 4.2 mmol/L (ref 3.5–5.1)
Sodium: 138 mmol/L (ref 135–145)

## 2016-06-13 LAB — PHOSPHORUS: Phosphorus: 8.5 mg/dL — ABNORMAL HIGH (ref 2.5–4.6)

## 2016-06-13 LAB — PROCALCITONIN: Procalcitonin: 4.42 ng/mL

## 2016-06-13 MED ORDER — FENTANYL CITRATE (PF) 100 MCG/2ML IJ SOLN
100.0000 ug | Freq: Once | INTRAMUSCULAR | Status: AC
  Administered 2016-06-13: 100 ug via INTRAVENOUS

## 2016-06-13 MED ORDER — DEXTROSE 5 % IV SOLN
0.0000 ug/min | INTRAVENOUS | Status: DC
  Administered 2016-06-13: 26 ug/min via INTRAVENOUS
  Administered 2016-06-13: 15 ug/min via INTRAVENOUS
  Administered 2016-06-13: 26 ug/min via INTRAVENOUS
  Filled 2016-06-13 (×3): qty 4

## 2016-06-13 MED ORDER — FENTANYL CITRATE (PF) 100 MCG/2ML IJ SOLN
50.0000 ug | INTRAMUSCULAR | Status: DC | PRN
  Administered 2016-06-13: 50 ug via INTRAVENOUS
  Filled 2016-06-13 (×3): qty 2

## 2016-06-13 MED ORDER — ROCURONIUM BROMIDE 50 MG/5ML IV SOLN
INTRAVENOUS | Status: AC
  Administered 2016-06-13: 50 mg via INTRAVENOUS
  Filled 2016-06-13: qty 1

## 2016-06-13 MED ORDER — NOREPINEPHRINE BITARTRATE 1 MG/ML IV SOLN
0.0000 ug/min | INTRAVENOUS | Status: DC
  Administered 2016-06-13: 33 ug/min via INTRAVENOUS
  Administered 2016-06-13: 60 ug/min via INTRAVENOUS
  Administered 2016-06-14: 25 ug/min via INTRAVENOUS
  Administered 2016-06-14: 60 ug/min via INTRAVENOUS
  Filled 2016-06-13 (×5): qty 16

## 2016-06-13 MED ORDER — MIDAZOLAM HCL 2 MG/2ML IJ SOLN
1.0000 mg | INTRAMUSCULAR | Status: DC | PRN
  Administered 2016-06-13: 1 mg via INTRAVENOUS
  Filled 2016-06-13: qty 2

## 2016-06-13 MED ORDER — ORAL CARE MOUTH RINSE
15.0000 mL | Freq: Four times a day (QID) | OROMUCOSAL | Status: DC
  Administered 2016-06-13 (×2): 15 mL via OROMUCOSAL

## 2016-06-13 MED ORDER — ROCURONIUM BROMIDE 50 MG/5ML IV SOLN
1.0000 mg/kg | Freq: Once | INTRAVENOUS | Status: AC
  Administered 2016-06-13: 50 mg via INTRAVENOUS

## 2016-06-13 MED ORDER — SODIUM CHLORIDE 0.9 % IV SOLN
1.5000 g | INTRAVENOUS | Status: DC
  Administered 2016-06-14: 1.5 g via INTRAVENOUS
  Filled 2016-06-13 (×2): qty 1.5

## 2016-06-13 MED ORDER — MIDAZOLAM HCL 2 MG/2ML IJ SOLN: 1.0000 mg | INTRAMUSCULAR | Status: DC | PRN

## 2016-06-13 MED ORDER — MIDAZOLAM HCL 2 MG/2ML IJ SOLN
INTRAMUSCULAR | Status: AC
  Administered 2016-06-13: 2 mg via INTRAVENOUS
  Filled 2016-06-13: qty 2

## 2016-06-13 MED ORDER — SODIUM CHLORIDE 0.9 % IV BOLUS (SEPSIS)
1000.0000 mL | Freq: Once | INTRAVENOUS | Status: AC
  Administered 2016-06-13: 1000 mL via INTRAVENOUS

## 2016-06-13 MED ORDER — SODIUM CHLORIDE 0.9 % IV BOLUS (SEPSIS)
500.0000 mL | Freq: Once | INTRAVENOUS | Status: AC
  Administered 2016-06-13: 500 mL via INTRAVENOUS

## 2016-06-13 MED ORDER — FENTANYL CITRATE (PF) 100 MCG/2ML IJ SOLN
INTRAMUSCULAR | Status: AC
  Administered 2016-06-13: 100 ug via INTRAVENOUS
  Filled 2016-06-13: qty 2

## 2016-06-13 MED ORDER — VASOPRESSIN 20 UNIT/ML IV SOLN
0.0400 [IU]/min | INTRAVENOUS | Status: DC
  Administered 2016-06-13: 0.04 [IU]/min via INTRAVENOUS
  Filled 2016-06-13 (×3): qty 2

## 2016-06-13 MED ORDER — ORAL CARE MOUTH RINSE
15.0000 mL | OROMUCOSAL | Status: DC
  Administered 2016-06-13 – 2016-06-14 (×8): 15 mL via OROMUCOSAL

## 2016-06-13 MED ORDER — PHENYLEPHRINE HCL 10 MG/ML IJ SOLN
0.0000 ug/min | INTRAMUSCULAR | Status: DC
  Administered 2016-06-13: 20 ug/min via INTRAVENOUS
  Administered 2016-06-14 (×2): 400 ug/min via INTRAVENOUS
  Administered 2016-06-14: 300 ug/min via INTRAVENOUS
  Administered 2016-06-14: 390 ug/min via INTRAVENOUS
  Administered 2016-06-14: 400 ug/min via INTRAVENOUS
  Administered 2016-06-14: 390 ug/min via INTRAVENOUS
  Administered 2016-06-14: 370 ug/min via INTRAVENOUS
  Filled 2016-06-13 (×15): qty 4

## 2016-06-13 MED ORDER — STERILE WATER FOR INJECTION IV SOLN
INTRAVENOUS | Status: DC
  Administered 2016-06-13: 23:00:00 via INTRAVENOUS
  Filled 2016-06-13 (×3): qty 850

## 2016-06-13 MED ORDER — SODIUM CHLORIDE 0.9 % IV SOLN
1.5000 g | Freq: Three times a day (TID) | INTRAVENOUS | Status: DC
  Administered 2016-06-13: 1.5 g via INTRAVENOUS
  Filled 2016-06-13 (×3): qty 1.5

## 2016-06-13 MED ORDER — SODIUM BICARBONATE 8.4 % IV SOLN
150.0000 meq | Freq: Once | INTRAVENOUS | Status: AC
  Administered 2016-06-13: 150 meq via INTRAVENOUS

## 2016-06-13 MED ORDER — SODIUM CHLORIDE 0.9% FLUSH: 10.0000 mL | INTRAVENOUS | Status: DC | PRN

## 2016-06-13 MED ORDER — INSULIN ASPART 100 UNIT/ML ~~LOC~~ SOLN
2.0000 [IU] | SUBCUTANEOUS | Status: DC
  Administered 2016-06-13: 2 [IU] via SUBCUTANEOUS
  Filled 2016-06-13: qty 2

## 2016-06-13 MED ORDER — FENTANYL CITRATE (PF) 100 MCG/2ML IJ SOLN
50.0000 ug | INTRAMUSCULAR | Status: DC | PRN
  Administered 2016-06-13 – 2016-06-14 (×2): 50 ug via INTRAVENOUS

## 2016-06-13 MED ORDER — SODIUM CHLORIDE 0.9% FLUSH
10.0000 mL | Freq: Two times a day (BID) | INTRAVENOUS | Status: DC
  Administered 2016-06-13: 10 mL

## 2016-06-13 MED ORDER — DOPAMINE-DEXTROSE 3.2-5 MG/ML-% IV SOLN
0.0000 ug/kg/min | INTRAVENOUS | Status: DC
  Administered 2016-06-13: 10 ug/kg/min via INTRAVENOUS

## 2016-06-13 MED ORDER — CHLORHEXIDINE GLUCONATE 0.12% ORAL RINSE (MEDLINE KIT)
15.0000 mL | Freq: Two times a day (BID) | OROMUCOSAL | Status: DC
  Administered 2016-06-13 – 2016-06-14 (×3): 15 mL via OROMUCOSAL

## 2016-06-13 MED ORDER — SODIUM BICARBONATE 8.4 % IV SOLN
INTRAVENOUS | Status: AC
  Administered 2016-06-13: 150 meq via INTRAVENOUS
  Filled 2016-06-13: qty 150

## 2016-06-13 MED ORDER — MIDAZOLAM HCL 2 MG/2ML IJ SOLN
2.0000 mg | Freq: Once | INTRAMUSCULAR | Status: AC
  Administered 2016-06-13: 2 mg via INTRAVENOUS

## 2016-06-13 MED ORDER — PANTOPRAZOLE SODIUM 40 MG IV SOLR
40.0000 mg | Freq: Every day | INTRAVENOUS | Status: DC
  Administered 2016-06-13: 40 mg via INTRAVENOUS
  Filled 2016-06-13: qty 40

## 2016-06-13 MED FILL — Medication: Qty: 1 | Status: AC

## 2016-06-13 NOTE — Procedures (Signed)
Intubation Procedure Note Verne CarrowLouvenia Keeran [id number] 11/21/1945  Procedure: Intubation Indications: Airway protection and maintenance  Procedure Details Consent: Unable to obtain consent because of emergent medical necessity. Time Out: Verified patient identification, verified procedure, site/side was marked, verified correct patient position, special equipment/implants available, medications/allergies/relevent history reviewed, required imaging and test results available.  Performed  Drugs:  50mcg Fentanyl, 2mg  Versed,, 50mg  Rocuronium.  with #[id number]. Grade 1 view. ET tube visualized passing through vocal cords. Following intubation:  positive color change on ETCO2, condensation seen in endotracheal tube, equal breath sounds bilaterally.  Evaluation Hemodynamic Status: Persistent hypotension treated with pressors and fluid; O2 sats: stable throughout Patient's Current Condition: unstable Complications: No apparent complications Patient did tolerate procedure well. Chest X-ray ordered to verify placement.  CXR: pending.     Bincy Varughese,AG-ACNP Pulmonary & Critical Care

## 2016-06-13 NOTE — ED Provider Notes (Signed)
[facility]  Department of Emergency Medicine   Code Blue CONSULT NOTE  Chief Complaint: Cardiac arrest/unresponsive   Level V Caveat: Unresponsive  History of present illness: I was contacted by the hospital for a CODE BLUE cardiac arrest upstairs and presented to the patient's bedside.  Arrived to patient's bedside; patient unresponsive but breathing spontaneously with spontaneous circulation. Admitted yesterday for small bowel obstruction.  ROS: Unable to obtain, Level V caveat  Scheduled Meds: . amLODipine  2.5 mg Oral Daily  . aspirin EC  81 mg Oral Daily  . atorvastatin  20 mg Oral q1800  . cinacalcet  90 mg Oral Q supper  . clopidogrel  75 mg Oral Daily  . docusate sodium  100 mg Oral BID  . feeding supplement (NEPRO CARB STEADY)  237 mL Oral TID BM  . fentaNYL      . fentaNYL (SUBLIMAZE) injection  100 mcg Intravenous Once  . heparin  5,000 Units Subcutaneous Q8H  . hydrALAZINE  10 mg Intravenous TID  . latanoprost  1 drop Both Eyes QHS  . metoprolol tartrate  25 mg Oral BID  . midazolam      . midazolam  2 mg Intravenous Once  . nitroGLYCERIN  0.5 inch Topical Q6H  . rocuronium      . rocuronium  1 mg/kg Intravenous Once  . sevelamer carbonate  1,600 mg Oral TID WC  . sodium chloride flush  3 mL Intravenous Q12H  . sodium polystyrene  30 g Oral Once  . venlafaxine XR  37.5 mg Oral Q breakfast   Continuous Infusions: . DOPamine 10 mcg/kg/min (06/13/16 0629)   PRN Meds:.acetaminophen **OR** acetaminophen, fentaNYL (SUBLIMAZE) injection, fentaNYL (SUBLIMAZE) injection, midazolam, midazolam, ondansetron **OR** ondansetron (ZOFRAN) IV, polyethylene glycol Past Medical History:  Diagnosis Date  . Anemia   . Dementia   . Diabetes mellitus (HCC)    a. not on medications  . Diabetic neuropathy (HCC)   . ESRD (end stage renal disease) on dialysis (HCC)    on dialysis  . Essential hypertension   . History of stroke   . HLD (hyperlipidemia)   .  Hyperparathyroidism (HCC)   . Peripheral vascular disease [facility])    Past Surgical History:  Procedure Laterality Date  . AMPUTATION TOE Right 11/02/2015   Procedure: AMPUTATION TOE;  Surgeon: Gwyneth Revels, DPM;  Location: [facility] ORS;  Service: Podiatry;  Laterality: Right;  . PERIPHERAL VASCULAR CATHETERIZATION Right 10/24/2015   Procedure: Lower Extremity Angiography;  Surgeon: Annice Needy, MD;  Location: [facility] INVASIVE CV LAB;  Service: Cardiovascular;  Laterality: Right;  . PERIPHERAL VASCULAR CATHETERIZATION Right 11/01/2015   Procedure: Lower Extremity Angiography;  Surgeon: Annice Needy, MD;  Location: [facility] INVASIVE CV LAB;  Service: Cardiovascular;  Laterality: Right;  . PERIPHERAL VASCULAR CATHETERIZATION  11/01/2015   Procedure: Lower Extremity Intervention;  Surgeon: Annice Needy, MD;  Location: [facility] INVASIVE CV LAB;  Service: Cardiovascular;;  . PERIPHERAL VASCULAR CATHETERIZATION N/A 11/06/2015   Procedure: A/V Shuntogram/Fistulagram;  Surgeon: Renford Dills, MD;  Location: [facility] INVASIVE CV LAB;  Service: Cardiovascular;  Laterality: N/A;  . PERIPHERAL VASCULAR CATHETERIZATION N/A 11/06/2015   Procedure: A/V Shunt Intervention;  Surgeon: Renford Dills, MD;  Location: [facility] INVASIVE CV LAB;  Service: Cardiovascular;  Laterality: N/A;  . PERIPHERAL VASCULAR CATHETERIZATION N/A 11/29/2015   Procedure: Dialysis/Perma Catheter Removal;  Surgeon: Annice Needy, MD;  Location: [facility] INVASIVE CV LAB;  Service: Cardiovascular;  Laterality: N/A;  . PERIPHERAL VASCULAR CATHETERIZATION  03/26/2016   Procedure: Dialysis/Perma Catheter Insertion;  Surgeon: Renford DillsGregory G Schnier, MD;  Location: [facility] INVASIVE CV LAB;  Service: Cardiovascular;;  . PERIPHERAL VASCULAR CATHETERIZATION Left 03/28/2016   Procedure: Thrombectomy;  Surgeon: Renford DillsGregory G Schnier, MD;  Location: [facility] INVASIVE CV LAB;  Service: Cardiovascular;  Laterality: Left;   Social History   Social History  . Marital status: Single    Spouse  name: N/A  . Number of children: N/A  . Years of education: N/A   Occupational History  . Not on file.   Social History Main Topics  . Smoking status: Never Smoker  . Smokeless tobacco: Never Used  . Alcohol use No  . Drug use: Unknown  . Sexual activity: Not on file   Other Topics Concern  . Not on file   Social History Narrative  . No narrative on file   No Known Allergies  Last set of Vital Signs (not current) Vitals:   06/13/16 0612 06/13/16 0618  BP: (!) 72/42 (!) 71/43  Pulse: 78   Resp:    Temp:        Physical Exam  Gen: unresponsive Cardiovascular: pulses  Resp: Spontaneous respirations  Abd: mildly distended  Neuro: GCS 3, unresponsive to pain  HEENT: Baseline left facial droop per nurse Neck: No crepitus  Musculoskeletal: No deformity  Skin: warm     Medical Decision making  71 year old female found unresponsive with spontaneous circulation and breathing on her own. Blood sugar within normal limits. Received dialysis yesterday and has remained volume depleted. Hypotensive. Hospitalist at bedside to assume care of patient. IV fluids not infusing; nursing reports issue with patient's IV access in her right EJ. Hospitalist to move the patient to the CCU and consult intensivist to place central line. Prior to my departure patient regained consciousness, moaning, moving all extremities 4.     Irean HongJade J Zamarian Scarano, MD 06/13/16 [phone]

## 2016-06-13 NOTE — Progress Notes (Signed)
Notified Dr. Ardyth Manam of patient's troponin of 0.16- no new orders.

## 2016-06-13 NOTE — Progress Notes (Signed)
Code blue called Responded Patient awake and responds to verbal commands O2 sat 100% room air BP 68/40 mm of Hg Pulse present. Patient moved to ICU IV access line not working. Intensivist consulted for central line placement for fluids and pressors IV

## 2016-06-13 NOTE — Progress Notes (Signed)
Legal guardian, Tracy Drake has contacted me and approved for DNR status/no re-intubation once extubated. MOST form completed. Family coming in town this weekend. Withdrawal from care/transition to comfort will need to be further addressed with guardian/family. DSS guardian will need to make final decision. See chart for legal documents.   NO CHARGE  Vennie HomansMegan Lyan Moyano, FNP-C Palliative Medicine Team  Phone: [phone] Fax: [phone]

## 2016-06-13 NOTE — Consult Note (Signed)
PULMONARY / CRITICAL CARE MEDICINE   Name: Tracy Drake MRN: [id number] DOB: [birth year]    ADMISSION DATE:  06/09/2016  CONSULTATION DATE: 06/13/16  REFERRING MD:  Dr.Pyreddy  CHIEF COMPLAINT:  Hypotension and Altered Mental Status  HISTORY OF PRESENT ILLNESS:   Tracy Drake is a 71 year old female with PMH significant for  Anemia, Dementia, ESRD, essential Hypertension, Stroke, hyperlipidemia, hyperparathyroidism and Peripheral Vascular disease. Patient was presented to ED on 2/28 with c/o abdominal pain , nausea and bloody non biilious emesis  For 2 days.  CT of the abdomen was concerning for small Bowel obstruction. Patient was admitted by the hospitalist service. On 3/2 patient was found to be minimally responsive with severe hypotension.  Therefore Patient was transferred to the ICU.  Patient was  Intubated for airway protection.and  Femoral central line was placed . PCCM team took over the care. On review of chart, the patient was thought to have bowel obstruction of uncertain etiology, she was treated with bowel rest. Apparently she had been minimally verbal. Vascular surgery was consulted due to difficulty in placement of vascular access, placed a right internal jugular line. However, this is not working currently, subsequently the patient was transferred to the ICU, she had a right femoral central line placed. Currently the patient is completely unresponsive and hypotensive. Per report the patient was mildy responsive last night.   PAST MEDICAL HISTORY :  She  has a past medical history of Anemia; Dementia; Diabetes mellitus (HCC); Diabetic neuropathy (HCC); ESRD (end stage renal disease) on dialysis (HCC); Essential hypertension; History of stroke; HLD (hyperlipidemia); Hyperparathyroidism (HCC); and Peripheral vascular disease (HCC).  PAST SURGICAL HISTORY: She  has a past surgical history that includes Cardiac catheterization (Right, 10/24/2015); Cardiac catheterization (Right,  11/01/2015); Cardiac catheterization (11/01/2015); Amputation toe (Right, 11/02/2015); Cardiac catheterization (N/A, 11/06/2015); Cardiac catheterization (N/A, 11/06/2015); Cardiac catheterization (N/A, 11/29/2015); Cardiac catheterization (03/26/2016); and Cardiac catheterization (Left, 03/28/2016).  No Known Allergies  No current facility-administered medications on file prior to encounter.    Current Outpatient Prescriptions on File Prior to Encounter  Medication Sig  . amLODipine (NORVASC) 2.5 MG tablet Take 1 tablet (2.5 mg total) by mouth daily.  [name] aspirin EC 81 MG tablet Take 81 mg by mouth daily.  [name] atorvastatin (LIPITOR) 20 MG tablet Take 20 mg by mouth daily at 6 PM.  . cinacalcet (SENSIPAR) 90 MG tablet Take 90 mg by mouth daily with supper.  . clopidogrel (PLAVIX) 75 MG tablet Take 1 tablet (75 mg total) by mouth daily.  [name] docusate sodium (COLACE) 100 MG capsule Take 1 capsule (100 mg total) by mouth 2 (two) times daily.  [name] HYDROcodone-acetaminophen (NORCO/VICODIN) 5-325 MG tablet Take 1-2 tablets by mouth every 4 (four) hours as needed for moderate pain or severe pain.  [name] latanoprost (XALATAN) 0.005 % ophthalmic solution 1 drop at bedtime.  . metoprolol tartrate (LOPRESSOR) 25 MG tablet Take 1 tablet (25 mg total) by mouth 2 (two) times daily.  . Nutritional Supplements (FEEDING SUPPLEMENT, NEPRO CARB STEADY,) LIQD Take 237 mLs by mouth 3 (three) times daily between meals.  . polyethylene glycol (MIRALAX / GLYCOLAX) packet Take 17 g by mouth daily as needed for mild constipation.  . senna (SENOKOT) 8.6 MG TABS tablet Take 1 tablet (8.6 mg total) by mouth 2 (two) times daily.  . sevelamer carbonate (RENVELA) 800 MG tablet Take 1,600 mg by mouth 3 (three) times daily with meals.  . venlafaxine XR (EFFEXOR-XR) 37.5 MG 24 hr capsule Take  37.5 mg by mouth daily with breakfast.  . mupirocin ointment (BACTROBAN) 2 % Place into the nose daily. (Patient not taking: Reported on 03/26/2016)     FAMILY HISTORY:  Her indicated that the status of her mother is unknown.    SOCIAL HISTORY: She  reports that she has never smoked. She has never used smokeless tobacco. She reports that she does not drink alcohol.  REVIEW OF SYSTEMS:   Unable to obtain as the patient is critically ill, intubated and on mechanical venti  SUBJECTIVE:  Unable to obtain as the patient is critically ill, intubated and on mechanical ventilation.  VITAL SIGNS: BP (!) 71/43 (BP Location: Right Arm)   Pulse 78   Temp 97.5 F (36.4 C) (Oral)   Resp (!) 22   Ht 5\' 4"  (1.626 m)   Wt 50 kg (110 lb 3.7 oz)   SpO2 95%   BMI 18.92 kg/m   HEMODYNAMICS:    VENTILATOR SETTINGS:    INTAKE / OUTPUT: I/O last 3 completed shifts: In: 33 [P.O.:30; I.V.:3] Out: -200   PHYSICAL EXAMINATION: General:  Sickly,frail appearing AA female now intubated and on mechanical ventilation, unresponsive.  Neuro:  unresponsive HEENT:  AT, , Thick white coat on tongue, PERRLA Cardiovascular:  S1S2, Regular, no MRG noted Lungs: diminished throughout  Abdomen:  Flat, nontender Musculoskeletal:  Right leg contracture Skin: multiple scars  LABS:  BMET  Recent Labs Lab 30-Sep-2016 0202 06/12/16 0450  NA 139 142  K 5.2* 6.7*  CL 103 104  CO2 24 22  BUN 57* 78*  CREATININE 5.33* 7.35*  GLUCOSE 179* 186*    Electrolytes  Recent Labs Lab 30-Sep-2016 0202 06/12/16 0450  CALCIUM 9.8 10.8*    CBC  Recent Labs Lab 30-Sep-2016 0202 06/12/16 0450  WBC 7.5 7.3  HGB 12.6 12.3  HCT 37.5 37.2  PLT 189 190    Coag's No results for input(s): APTT, INR in the last 168 hours.  Sepsis Markers  Recent Labs Lab 30-Sep-2016 1016  LATICACIDVEN 1.0    ABG No results for input(s): PHART, PCO2ART, PO2ART in the last 168 hours.  Liver Enzymes  Recent Labs Lab 30-Sep-2016 0202  AST 17  ALT 14  ALKPHOS 136*  BILITOT 0.5  ALBUMIN 4.0    Cardiac Enzymes No results for input(s): TROPONINI, PROBNP in the  last 168 hours.  Glucose  Recent Labs Lab 06/12/16 2248 06/13/16 0609  GLUCAP 175* 110*    Imaging No results found.   STUDIES:  03/17/17 CT abdomen and pelvis>>Small-bowel obstruction, transition poor tentatively identified in the central abdomen. Mesenteric edema and small volume of freefluid in the pericolic gutters. Advanced vascular calcifications.  No aortic aneurysm.. Stool distending the rectum with mild rectal wall thickening, maybe chronic constipation.. Cardiomegaly noted in the included lower thorax. There isaneurysmal dilatation of the ascending aorta, partially included,maximal dimension 4.3 cm. Recommend annual imaging followup by CTA  10/23/15 ECHO>>The cavity size was small. Wall thickness was  increased in a pattern of mild LVH. Systolic function was  vigorous. The estimated ejection fraction was in the range of 65%  to 70%  CULTURES: 3/2 BC>> 3/2 Stow>>  ANTIBIOTICS: 3/2 Ampicillin Salbactum>>  SIGNIFICANT EVENTS: 2/28 [facility] 3/2 Patient transferred to the ICU with severe hypotension and altered mental status  LINES/TUBES: 3/2 Right femoral central line>> 3/2 ET tube  ASSESSMENT / PLAN:  PULMONARY A: Intubated for Airway protection Aspiration Pneumonia, copious secretions noted in airway on intubation.  ?  Septic shock P:   Vent setting established VAP bundle implemented Prn fentanyl/versed  Unasyn Levo gtt  CARDIOVASCULAR A:  Shock- septic.  Hx of HTN/HLD CAD  P:  Continuous Telemetry Levo gtt Keep MAP Goals >65 Will hold metoprolol and Hydralazine Continue aspirin Continue Lipitor Continue plavix  RENAL A:   ESRD Hyperkalemia P:   Nephrology consuted, follow recommendation.   GASTROINTESTINAL A:   Small bowel  Obstruction ?mesenteric ischemia.  P:   Surgery consulted, follow recommendation Famotidine for GIP Continue bowel rest.   HEMATOLOGIC A:   No active issues P:  Heparin for DVT  prophylaxis  INFECTIOUS A:   Aspiration PNA P:   Monitor fever, WBC Follow cultures  ENDOCRINE A:   Diabetes Melitus P:   Blood glucose checks with SSI coverage  NEUROLOGIC A:   Acute unresponsiveness with metabolic encephalopathy; possibly secondary to septic shock vs. CVA.  Hx of Stroke PVD Diabetic Neuropathy Hx f Stroke P:   RASS goal: 0 to -1 CT head when more stable.  Fentanyl/versed prn Hold Venlafaxine CT head   FAMILY  - Updates: No family member at the bedside     Bincy Varughese,AG-ACNP Pulmonary & Critical Care Pulmonary and Critical Care Medicine [facility]   06/13/2016, 7:21 AM  Patient seen and examined, above note as amended, reflects my findings, assessment, plan. Severe septic shock with unresponsiveness, possible etiologies include septic shock secondary to aspiration pneumonia, mesenteric ischemia, and possible unresponsiveness due to stroke. Continue IV fluid resuscitation, discussed with nephrology, IV antibiotics, CT head when stable.  Wells Guiles, M.D.  06/13/2016   Critical Care Attestation.  I have personally obtained a history, examined the patient, evaluated laboratory and imaging results, formulated the assessment and plan and placed orders. The Patient requires high complexity decision making for assessment and support, frequent evaluation and titration of therapies, application of advanced monitoring technologies and extensive interpretation of multiple databases. The patient has critical illness that could lead imminently to failure of 1 or more organ systems and requires the highest level of physician preparedness to intervene.  Critical Care Time devoted to patient care services described in this note is 65 minutes and is exclusive of time spent in procedures.

## 2016-06-13 NOTE — Progress Notes (Signed)
Patient now on 3 pressors to maintain MAP of 65 and above- Patient had been NSR most of shift- now is ST in the 120's- Sonda Rumbleana Blakeney, NP made aware.  Patient does withdrawal with pain and opens eyes.  Family at bedside.

## 2016-06-13 NOTE — Progress Notes (Signed)
Notified Dr. Ardyth Manam radiologist tech calling me to tell MD that ET should be pulled back 2-2.5cm.  Dr. Ardyth Manam ordered to pull back cm.  I relayed this information to Rhod[place]ron[place], RT.

## 2016-06-13 NOTE — Procedures (Signed)
Central Venous Catheter Insertion Procedure Note - Right Femoral Tracy CarrowLouvenia Donaway [id number] 09/10/1945  Procedure: Insertion of Central Venous Catheter Indications: Assessment of intravascular volume, Drug and/or fluid administration and Frequent blood sampling  Procedure Details Consent: Unable to obtain consent because of emergent medical necessity. Time Out: Verified patient identification, verified procedure, site/side was marked, verified correct patient position, special equipment/implants available, medications/allergies/relevent history reviewed, required imaging and test results available.  Performed  Maximum sterile technique was used including antiseptics, cap, gloves, gown, hand hygiene, mask and sheet. Skin prep: Chlorhexidine; local anesthetic administered A antimicrobial bonded/coated triple lumen catheter was placed in the Right femoral vein due to patient being a dialysis patient and emergent situation using the Seldinger technique.  Evaluation Blood flow good Complications: No apparent complications Patient did tolerate procedure well.   Procedure performed under direct ultrasound guidance for real time vessel cannulation.        Bincy Varughese,AG-ACNP Pulmonary & Critical Care 06/13/2016, 7:15 AM

## 2016-06-13 NOTE — Progress Notes (Signed)
Notified Dr. Ardyth Manam that patient's repeat lactic acid is 9.0- he ordered to have 3 liter bolus.  I also stated that I believe the patient has had a stroke- due to pupils unreactive- patient has facial droop on left side and patient deviates her body towards the left side.  Due to patient being on 26mcqs of levophed at this time- Dr. Ardyth Manam stated to hold off CT of head at this time.

## 2016-06-13 NOTE — Progress Notes (Signed)
Notified Dr. Ardyth Manam of lactic acid of 7.3- he ordered to recheck lactic acid now since bolus was given.

## 2016-06-13 NOTE — Progress Notes (Signed)
[facility]  ROUNDING NOTE   Subjective:   Unresponsive, Code Blue called. Intubated, transferred to ICU.   Hemodialysis yesterday. Did not tolerate treatment well. Abdominal pains throughout treatment.   No UF.   Objective:  Vital signs in last 24 hours:  Temp:  [97.5 F (36.4 C)-99 F (37.2 C)] 97.6 F (36.4 C) (03/02 0800) Pulse Rate:  [60-83] 78 (03/02 0612) Resp:  [14-27] 14 (03/02 0800) BP: (69-162)/(35-85) 116/75 (03/02 0800) SpO2:  [94 %-100 %] 100 % (03/02 0800) FiO2 (%):  [50 %] 50 % (03/02 0721) Weight:  [49.8 kg (109 lb 12.8 oz)-50 kg (110 lb 3.7 oz)] 50 kg (110 lb 3.7 oz) (03/01 1330)  Weight change: 0 kg (0 lb) Filed Weights   06/12/16 0500 06/12/16 0940 06/12/16 1330  Weight: 49.8 kg (109 lb 12.8 oz) 49.8 kg (109 lb 12.8 oz) 50 kg (110 lb 3.7 oz)    Intake/Output: I/O last 3 completed shifts: In: 60 [P.O.:30; I.V.:3] Out: -200    Intake/Output this shift:  Total I/O In: 166.1 [I.V.:116.1; IV Piggyback:50] Out: -   Physical Exam: General: Critically Ill  Head: ETT  Eyes: Anicteric, PERRL  Neck: Supple, trachea midline  Lungs:  Coarse breath sounds bilaterally, PRVC 50%  Heart: Regular rate and rhythm  Abdomen:  Soft  Extremities: no peripheral edema.  Neurologic: Intubated, sedated  Skin: No lesions  Access: Left AVF    Basic Metabolic Panel:  Recent Labs Lab 05/18/2016 0202 06/12/16 0450 06/13/16 0708  NA 139 142 138  K 5.2* 6.7* 4.2  CL 103 104 93*  CO2 _0 GLUCOSE 179* 186* 148*  BUN 57* 78* 40*  CREATININE 5.33* 7.35* 5.19*  CALCIUM 9.8 10.8* 10.5*  MG  --   --  2.2  PHOS  --   --  8.5*    Liver Function Tests:  Recent Labs Lab 06/05/2016 0202  AST 17  ALT 14  ALKPHOS 136*  BILITOT 0.5  PROT 7.9  ALBUMIN 4.0    Recent Labs Lab 05/30/2016 0202  LIPASE 39   No results for input(s): AMMONIA in the last 168 hours.  CBC:  Recent Labs Lab 05/22/2016 0202 06/12/16 0450  WBC 7.5 7.3  HGB 12.6  12.3  HCT 37.5 37.2  MCV 99.4 99.8  PLT 189 190    Cardiac Enzymes:  Recent Labs Lab 06/13/16 0708  TROPONINI 0.04*    BNP: Invalid input(s): POCBNP  CBG:  Recent Labs Lab 06/12/16 2248 06/13/16 0609  GLUCAP 175* 110*    Microbiology: Results for orders placed or performed during the hospital encounter of 05/31/2016  MRSA PCR Screening     Status: None   Collection Time: 06/10/2016  4:47 PM  Result Value Ref Range Status   MRSA by PCR NEGATIVE NEGATIVE Final    Comment:        The GeneXpert MRSA Assay (FDA approved for NASAL specimens only), is one component of a comprehensive MRSA colonization surveillance program. It is not intended to diagnose MRSA infection nor to guide or monitor treatment for MRSA infections.     Coagulation Studies: No results for input(s): LABPROT, INR in the last 72 hours.  Urinalysis: No results for input(s): COLORURINE, LABSPEC, PHURINE, GLUCOSEU, HGBUR, BILIRUBINUR, KETONESUR, PROTEINUR, UROBILINOGEN, NITRITE, LEUKOCYTESUR in the last 72 hours.  Invalid input(s): APPERANCEUR    Imaging: Dg Chest Port 1 View  Result Date: 06/13/2016 CLINICAL DATA:  Hypoxia EXAM: PORTABLE CHEST 1 VIEW COMPARISON:  None. FINDINGS:  Endotracheal tube tip is 7 mm above the carina with the tip directed toward the right main bronchus. Nasogastric tube tip and side port are below the diaphragm with the side port seen in the stomach. No pneumothorax. There is atelectatic change in both lower lung zones. There is no airspace consolidation or edema. Heart size and pulmonary vascular normal. There is atherosclerotic calcification in the aorta. Subclavian stent noted on the left. IMPRESSION: Endotracheal tube as described. Endotracheal tube is near the carina with the tube directed toward the right. Advise withdrawing endotracheal tube approximately 2-2.5 cm. Mild atelectasis in the lower lung zones. No edema or consolidation. Aortic atherosclerosis. These results  will be called to the ordering clinician or representative by the Radiologist Assistant, and communication documented in the PACS or zVision Dashboard. Electronically Signed   By: Lowella Grip III M.D.   On: 06/13/2016 07:33   Dg Abd Portable 1v  Result Date: 06/13/2016 CLINICAL DATA:  OG tube placement. EXAM: PORTABLE ABDOMEN - 1 VIEW COMPARISON:  CT abdomen and pelvis 05/19/2016 FINDINGS: An enteric tube has been placed and terminates in the left upper quadrant with tip likely in the gastric body. The side hole is beyond the GE junction as well. There are multiple loops of mildly dilated small bowel in the upper and mid abdomen. The lower abdomen/ pelvis was not included. Postsurgical changes are again noted with numerous sutures in the upper abdomen. No intraperitoneal free air is identified on this supine study. The lungs are more fully evaluated on separate chest radiograph today. IMPRESSION: 1. Enteric tube placement as above. 2. Persistent small bowel dilatation consistent with obstruction as described on CT. Electronically Signed   By: Logan Bores M.D.   On: 06/13/2016 08:03     Medications:   . DOPamine 10 mcg/kg/min (06/13/16 0629)  . norepinephrine (LEVOPHED) Adult infusion 22 mcg/min (06/13/16 0900)   . ampicillin-sulbactam (UNASYN) IV  1.5 g Intravenous Q8H  . aspirin EC  81 mg Oral Daily  . atorvastatin  20 mg Oral q1800  . chlorhexidine gluconate (MEDLINE KIT)  15 mL Mouth Rinse BID  . cinacalcet  90 mg Oral Q supper  . clopidogrel  75 mg Oral Daily  . docusate sodium  100 mg Oral BID  . feeding supplement (NEPRO CARB STEADY)  237 mL Oral TID BM  . heparin  5,000 Units Subcutaneous Q8H  . insulin aspart  2-6 Units Subcutaneous Q4H  . latanoprost  1 drop Both Eyes QHS  . mouth rinse  15 mL Mouth Rinse QID  . nitroGLYCERIN  0.5 inch Topical Q6H  . sevelamer carbonate  1,600 mg Oral TID WC  . sodium chloride flush  3 mL Intravenous Q12H  . sodium polystyrene  30 g Oral  Once     Assessment/ Plan:  Tracy Drake is a 71 y.o. black female with hypertension, diabetes mellitus type II, hyperlipidemia, thyroid disorder and ESRD on hemodialysis.    Respiratory failure 3/2 requiring intubated and mechanical ventilation.    [address]    1. End Stage Renal Disease with hyperkalemia: overall prognosis poor. Did not tolerated dialysis yesterday. Will hold off on dialysis until patient is more stable. No acute indication for dialysis today.  Will not offer continuous renal replacement therapy.   2. Anemia of chronic kidney disease: hemoglobin 12.3 - hold epo   3. Secondary Hyperparathyroidism with hyperphosphatemia: PTH 1180, phos 7.9 on 2/13.  - hold cinacalcet due to GI symptoms -  hold sevelamer  4. Hypertension: blood pressure at goal.  - amlodipine and metoprolol.     LOS: [place], [place] 3/2/20189:25 AM

## 2016-06-13 NOTE — Consult Note (Signed)
Consultation Note Date: 06/13/2016   Patient Name: Tracy Drake  DOB: [birth year]  MRN: [id number]  Age / Sex: 71 y.o., female  PCP: Pcp Not In System Referring Physician: Laverle Hobby, MD  Reason for Consultation: Establishing goals of care  HPI/Patient Profile: 71 y.o. female with past medical history of dementia, ESRD on HD, CVA, PVD, HLD, diabetes mellitus, and anemia admitted on 05/22/2016 with abdominal pain, nausea, and bloody non bilious emesis for 2 days. CT abdomen concerning for small bowel obstruction. On 3/2, patient became minimally responsive with severe hypotension. Transferred to ICU. Intubated for airway protection. Aspiration pneumonia. ? Septic shock with lactic acid of 9.0. On levo gtt. Possible mesenteric ischemia. Metabolic encephalopathy secondary to septic shock or CVA. Palliative medicine consultation for goals of care.   Clinical Assessment and Goals of Care: Patient with legal guardian through DSS for about six months. Spoke with guardian, Tracy Drake via telephone. Introduced palliative medicine. Prior to hospitalization, [name] was living at a nursing facility. Baseline bed bound with dementia. Challenging family dynamics. The patient has 7 children in different states. They have all expressed wishes on her wanting to be resuscitated and on life support if necessary. Tracy Drake is the final decision maker but is in frequent contact with family.  Discussed diagnoses, underlying co-morbidities, poor prognosis, and code status. Tracy Drake needs a recommendation letter and MOST form to present to her upper management before decisions can be made on code status and withdrawal from care. She seems realistic of poor prognosis and hopeful the family will be in town this weekend. Tracy Drake gives the medical team permission to speak with all family members, they just cannot make final decisions.    Sister, Tracy Drake at bedside. I discussed in detail diagnoses, underlying co-morbidities, poor prognosis, decisions regarding code status/EOL wishes that the guardian/family are faced with. Educated on my recommendation for DNR and comfort measures with poor chance of meaningful recovery or quality of life if she survived this hospitalization. Tracy Drake speaks of her strong faith and that Tracy Drake is a "strong lady that has been through a lot." Tracy Drake is waiting for family to arrive.    Questions and concerns were addressed. Emotional support provided. Hard Choices booklet left for review.  SUMMARY OF RECOMMENDATIONS    FULL code. Continue aggressive medical interventions.   I have spoke with legal guardian, Tracy Drake. I have sent her a letter and MOST form with recommendations moving forwarding--regarding DNR, withdrawal of care, compassionate extubation, and comfort measures only. (Copy placed in chart).   Tracy Drake will consider and discuss with DSS and family this weekend.   Tracy Drake gives permission for medical team to discuss care with all family members but family cannot make final decisions.   PMT not at [facility] over the weekend but will f/u on Monday, 06/16/16 if patient survives the weekend.   Code Status/Advance Care Planning:  Full code   Symptom Management:   Per attending  Palliative Prophylaxis:   Aspiration, Delirium Protocol, Oral Care and Turn Reposition  Additional Recommendations (Limitations,  Scope, Preferences):  Full Scope Treatment  Psycho-social/Spiritual:   Desire for further Chaplaincy support:yes  Additional Recommendations: Caregiving  Support/Resources, Education on Hospice and Grief/Bereavement Support  Prognosis:   Unable to determine guarded with septic shock, multiorgan failure, ESRD on HD, and underlying dementia and strokes.    Discharge Planning: To Be Determined      Primary Diagnoses: Present on Admission: . SBO (small bowel obstruction)   I  have reviewed the medical record, interviewed the patient and family, and examined the patient. The following aspects are pertinent.  Past Medical History:  Diagnosis Date  . Anemia   . Dementia   . Diabetes mellitus ([place])    a. not on medications  . Diabetic neuropathy ([place])   . ESRD (end stage renal disease) on dialysis ([place])    on dialysis  . Essential hypertension   . History of stroke   . HLD (hyperlipidemia)   . Hyperparathyroidism ([place])   . Peripheral vascular disease [facility])    Social History   Social History  . Marital status: Single    Spouse name: N/A  . Number of children: N/A  . Years of education: N/A   Social History Main Topics  . Smoking status: Never Smoker  . Smokeless tobacco: Never Used  . Alcohol use No  . Drug use: Unknown  . Sexual activity: Not Asked   Other Topics Concern  . None   Social History Narrative  . None   Family History  Problem Relation Age of Onset  . Hypertension Mother    Scheduled Meds: . [START ON 06-30-16] ampicillin-sulbactam (UNASYN) IV  1.5 g Intravenous Q24H  . chlorhexidine gluconate (MEDLINE KIT)  15 mL Mouth Rinse BID  . heparin  5,000 Units Subcutaneous Q8H  . insulin aspart  2-6 Units Subcutaneous Q4H  . latanoprost  1 drop Both Eyes QHS  . mouth rinse  15 mL Mouth Rinse QID  . nitroGLYCERIN  0.5 inch Topical Q6H  . pantoprazole (PROTONIX) IV  40 mg Intravenous QHS  . sodium chloride  1,000 mL Intravenous Once  . sodium chloride flush  3 mL Intravenous Q12H   Continuous Infusions: . DOPamine 10 mcg/kg/min (06/13/16 0629)  . norepinephrine (LEVOPHED) Adult infusion 26 mcg/min (06/13/16 1128)   PRN Meds:.acetaminophen **OR** acetaminophen, fentaNYL (SUBLIMAZE) injection, fentaNYL (SUBLIMAZE) injection, midazolam, midazolam, ondansetron **OR** ondansetron (ZOFRAN) IV, polyethylene glycol Medications Prior to Admission:  Prior to Admission medications   Medication Sig Start Date End Date Taking? Authorizing  Provider  amLODipine (NORVASC) 2.5 MG tablet Take 1 tablet (2.5 mg total) by mouth daily. 11/07/15  Yes Theodoro Grist, MD  aspirin EC 81 MG tablet Take 81 mg by mouth daily.   Yes Historical Provider, MD  atorvastatin (LIPITOR) 20 MG tablet Take 20 mg by mouth daily at 6 PM.   Yes Historical Provider, MD  cinacalcet (SENSIPAR) 90 MG tablet Take 90 mg by mouth daily with supper.   Yes Historical Provider, MD  clopidogrel (PLAVIX) 75 MG tablet Take 1 tablet (75 mg total) by mouth daily. 11/03/15  Yes Theodoro Grist, MD  docusate sodium (COLACE) 100 MG capsule Take 1 capsule (100 mg total) by mouth 2 (two) times daily. 11/03/15  Yes Theodoro Grist, MD  HYDROcodone-acetaminophen (NORCO/VICODIN) 5-325 MG tablet Take 1-2 tablets by mouth every 4 (four) hours as needed for moderate pain or severe pain. 11/07/15  Yes Theodoro Grist, MD  latanoprost (XALATAN) 0.005 % ophthalmic solution 1 drop at bedtime.   Yes Historical Provider,  MD  metoprolol tartrate (LOPRESSOR) 25 MG tablet Take 1 tablet (25 mg total) by mouth 2 (two) times daily. 03/28/16  Yes Fritzi Mandes, MD  Nutritional Supplements (FEEDING SUPPLEMENT, NEPRO CARB STEADY,) LIQD Take 237 mLs by mouth 3 (three) times daily between meals. 11/07/15  Yes Theodoro Grist, MD  polyethylene glycol (MIRALAX / GLYCOLAX) packet Take 17 g by mouth daily as needed for mild constipation. 11/03/15  Yes Theodoro Grist, MD  senna (SENOKOT) 8.6 MG TABS tablet Take 1 tablet (8.6 mg total) by mouth 2 (two) times daily. 11/03/15  Yes Theodoro Grist, MD  sevelamer carbonate (RENVELA) 800 MG tablet Take 1,600 mg by mouth 3 (three) times daily with meals.   Yes Historical Provider, MD  venlafaxine XR (EFFEXOR-XR) 37.5 MG 24 hr capsule Take 37.5 mg by mouth daily with breakfast.   Yes Historical Provider, MD  mupirocin ointment (BACTROBAN) 2 % Place into the nose daily. Patient not taking: Reported on 03/26/2016 11/03/15   Theodoro Grist, MD   No Known Allergies Review of Systems  Unable  to perform ROS: Acuity of condition   Physical Exam  Constitutional: She appears lethargic. She appears ill.  HENT:  Head: Normocephalic and atraumatic.  Left facial droop  Cardiovascular: Regular rhythm and normal heart sounds.   Pulmonary/Chest: No tachypnea. No respiratory distress. She has decreased breath sounds.  Abdominal: She exhibits distension. Bowel sounds are increased. There is no tenderness.  Neurological: She appears lethargic.  Skin: Skin is warm and dry.  Psychiatric: She is inattentive.  Nursing note and vitals reviewed.  Vital Signs: BP 105/71   Pulse 78   Temp 97.6 F (36.4 C) (Axillary)   Resp 16   Ht 5' 4"  (1.626 m)   Wt 50 kg (110 lb 3.7 oz)   SpO2 100%   BMI 18.92 kg/m  Pain Assessment: Faces POSS *See Group Information*: S-Acceptable,Sleep, easy to arouse Pain Score: Asleep  SpO2: SpO2: 100 % O2 Device:SpO2: 100 % O2 Flow Rate: .   IO: Intake/output summary:   Intake/Output Summary (Last 24 hours) at 06/13/16 1250 Last data filed at 06/13/16 0900  Gross per 24 hour  Intake           196.11 ml  Output             -200 ml  Net           396.11 ml    LBM:   Baseline Weight: Weight: 54.4 kg (120 lb) Most recent weight: Weight: 50 kg (110 lb 3.7 oz)     Palliative Assessment/Data: PPS 10%   Flowsheet Rows   Flowsheet Row Most Recent Value  Intake Tab  Referral Department  Hospitalist  Unit at Time of Referral  Med/Surg Unit  Palliative Care Primary Diagnosis  Nephrology  Date Notified  06/06/2016  Palliative Care Type  Return patient Palliative Care  Reason for referral  Clarify Goals of Care  Date of Admission  05/16/2016  Date first seen by Palliative Care  06/13/16  # of days IP prior to Palliative referral  0  Clinical Assessment  Palliative Performance Scale Score  10%  Psychosocial & Spiritual Assessment  Palliative Care Outcomes  Patient/Family meeting held?  Yes  Who was at the meeting?  spoke with legal guardian via telephone  and met with sister Tracy Drake) at bedside  [place] goals of care, Provided psychosocial or spiritual support, ACP counseling assistance      Time In: 1200 Time Out:  1415 Time Total: 135 min Greater than 50%  of this time was spent counseling and coordinating care related to the above assessment and plan.  Signed by:  Ihor Dow, FNP-C Palliative Medicine Team  Phone: [phone] Fax: [phone]   Please contact Palliative Medicine Team phone at [phone] for questions and concerns.  For individual provider: See Shea Evans

## 2016-06-13 NOTE — Progress Notes (Signed)
Initial Nutrition Assessment  DOCUMENTATION CODES:   Underweight  INTERVENTION:  -Pt not appropriate for initiation of enteral nutrition at this time. Recommend initiation of nutrition as soon as clinically feasible based on poc  NUTRITION DIAGNOSIS:   Inadequate oral intake related to acute illness as evidenced by NPO status.  GOAL:   Patient will meet greater than or equal to 90% of their needs   MONITOR:   Vent status, Labs, Weight trends, TF tolerance  REASON FOR ASSESSMENT:   Ventilator    ASSESSMENT:    71 yo female admitted with possible bowel obstruction. Code blue called on 3/2 and pt intubated for airway protection  Patient is currently intubated on ventilator support MV: 11.3 L/min Temp (24hrs), Avg:97.8 F (36.6 C), Min:97.5 F (36.4 C), Max:98.5 F (36.9 C)  Pt did not tolerate HD well yesterday, holding on HD at this time until more stable (will not start CRRT)  Per Charli RN, 1 L of stool-like fluid via NG tube this AM  Labs: phosphorus 8.5, corrected calcium 10.14 (albumin 2.7) Meds: reviewed  Diet Order:  Diet NPO time specified  Skin:  Reviewed, no issues  Last BM:  no documented BM  Height:   Ht Readings from Last 1 Encounters:  05/26/2016 5\' 4"  (1.626 m)    Weight:   Wt Readings from Last 1 Encounters:  06/12/16 110 lb 3.7 oz (50 kg)    BMI:  Body mass index is 18.92 kg/m.  Estimated Nutritional Needs:   Kcal:  1256 kcals  Protein:  75-100 g  Fluid:  >/= 1.3 L  EDUCATION NEEDS:   Education needs addressed  Romelle Starcherate Azan Maneri MS, RD, LDN [phone] Pager  [phone] Weekend/On-Call Pager

## 2016-06-13 NOTE — Progress Notes (Signed)
Notified Sonda Rumbleana Blakeney, NP of patient's Lactic Acid level of 10.4.  No new orders.

## 2016-06-13 NOTE — Progress Notes (Signed)
Notified Dr. Ardyth Manam that patient's pupils are not reactive and patient's tongue is out deviating to left side and I believe that patient has had a stroke.

## 2016-06-13 NOTE — Progress Notes (Signed)
CH responded to a CODE Blue in room 212. Pt was being attended to upon my arrival. Family was being notified. Pt to be transferred to IC-15. CH provided silent prayer and will notify the unit [facility] for a follow up.    06/13/16 0700  Clinical Encounter Type  Visited With Patient;Health care provider  Visit Type Initial;Spiritual support;Code (Code Blue)  Referral From Nurse  Consult/Referral To Chaplain  Spiritual Encounters  Spiritual Needs Prayer

## 2016-06-13 NOTE — Progress Notes (Signed)
Pt hypotensive at morning VS. Nurse spoke with Dr Tobi BastosPyreddy- orders for 500 ml bolus. Nurse entered room  and pt responded very little, faint pulse. Code blue called. CL not working. Transferred to ICU. Nurse accompanied, report given to Truddie Cocoell, RN. Telephoned [name]- voice mail. No message given d/t HIPPA. Spoke with [employer]. ICU will telephone of change of status to speak with guardian.

## 2016-06-14 DIAGNOSIS — J9600 Acute respiratory failure, unspecified whether with hypoxia or hypercapnia: Secondary | ICD-10-CM

## 2016-06-14 DIAGNOSIS — A419 Sepsis, unspecified organism: Secondary | ICD-10-CM

## 2016-06-14 DIAGNOSIS — R6521 Severe sepsis with septic shock: Secondary | ICD-10-CM

## 2016-06-14 LAB — POTASSIUM: Potassium: 7.4 mmol/L (ref 3.5–5.1)

## 2016-06-14 LAB — GLUCOSE, CAPILLARY
Glucose-Capillary: 100 mg/dL — ABNORMAL HIGH (ref 65–99)
Glucose-Capillary: 113 mg/dL — ABNORMAL HIGH (ref 65–99)
Glucose-Capillary: 135 mg/dL — ABNORMAL HIGH (ref 65–99)
Glucose-Capillary: 48 mg/dL — ABNORMAL LOW (ref 65–99)
Glucose-Capillary: 51 mg/dL — ABNORMAL LOW (ref 65–99)
Glucose-Capillary: 89 mg/dL (ref 65–99)
Glucose-Capillary: 95 mg/dL (ref 65–99)
Glucose-Capillary: 96 mg/dL (ref 65–99)

## 2016-06-14 LAB — BASIC METABOLIC PANEL
ANION GAP: 30 — AB (ref 5–15)
ANION GAP: 36 — AB (ref 5–15)
BUN: 44 mg/dL — ABNORMAL HIGH (ref 6–20)
BUN: 46 mg/dL — ABNORMAL HIGH (ref 6–20)
CALCIUM: 6.8 mg/dL — AB (ref 8.9–10.3)
CO2: 9 mmol/L — ABNORMAL LOW (ref 22–32)
CO2: 9 mmol/L — AB (ref 22–32)
CREATININE: 4.83 mg/dL — AB (ref 0.44–1.00)
Calcium: 7.1 mg/dL — ABNORMAL LOW (ref 8.9–10.3)
Chloride: 101 mmol/L (ref 101–111)
Chloride: 96 mmol/L — ABNORMAL LOW (ref 101–111)
Creatinine, Ser: 4.88 mg/dL — ABNORMAL HIGH (ref 0.44–1.00)
GFR calc non Af Amer: 8 mL/min — ABNORMAL LOW (ref >60)
GFR, EST AFRICAN AMERICAN: 9 mL/min — AB (ref >60)
GFR, EST AFRICAN AMERICAN: 9 mL/min — AB (ref >60)
GFR, EST NON AFRICAN AMERICAN: 8 mL/min — AB (ref >60)
GLUCOSE: 96 mg/dL (ref 65–99)
GLUCOSE: 237 mg/dL — AB (ref 65–99)
POTASSIUM: 6.3 mmol/L — AB (ref 3.5–5.1)
Potassium: 7 mmol/L (ref 3.5–5.1)
SODIUM: 140 mmol/L (ref 135–145)
Sodium: 141 mmol/L (ref 135–145)

## 2016-06-14 LAB — PROCALCITONIN: Procalcitonin: 17.99 ng/mL

## 2016-06-14 LAB — CBC
HEMATOCRIT: 21.3 % — AB (ref 35.0–47.0)
Hemoglobin: 6.4 g/dL — ABNORMAL LOW (ref 12.0–16.0)
MCH: 33.1 pg (ref 26.0–34.0)
MCHC: 29.9 g/dL — ABNORMAL LOW (ref 32.0–36.0)
MCV: 110.8 fL — AB (ref 80.0–100.0)
PLATELETS: 81 10*3/uL — AB (ref 150–440)
RBC: 1.92 MIL/uL — AB (ref 3.80–5.20)
RDW: 15.5 % — AB (ref 11.5–14.5)
WBC: 4.8 10*3/uL (ref 3.6–11.0)

## 2016-06-14 LAB — HEMOGLOBIN AND HEMATOCRIT, BLOOD
HEMATOCRIT: 23.9 % — AB (ref 35.0–47.0)
HEMOGLOBIN: 7.3 g/dL — AB (ref 12.0–16.0)

## 2016-06-14 LAB — TROPONIN I: Troponin I: 2.85 ng/mL (ref <0.03)

## 2016-06-14 LAB — PREPARE RBC (CROSSMATCH)

## 2016-06-14 LAB — PHOSPHORUS: PHOSPHORUS: 14.4 mg/dL — AB (ref 2.5–4.6)

## 2016-06-14 LAB — MAGNESIUM: MAGNESIUM: 2.5 mg/dL — AB (ref 1.7–2.4)

## 2016-06-14 MED ORDER — DEXTROSE 50 % IV SOLN
INTRAVENOUS | Status: AC
  Administered 2016-06-14: 50 mL via INTRAVENOUS
  Filled 2016-06-14: qty 50

## 2016-06-14 MED ORDER — DEXTROSE 50 % IV SOLN
1.0000 | Freq: Once | INTRAVENOUS | Status: AC
  Administered 2016-06-14: 50 mL via INTRAVENOUS
  Filled 2016-06-14: qty 50

## 2016-06-14 MED ORDER — SODIUM CHLORIDE 0.9 % IV SOLN
1.0000 g | Freq: Once | INTRAVENOUS | Status: AC
  Administered 2016-06-14: 1 g via INTRAVENOUS
  Filled 2016-06-14: qty 10

## 2016-06-14 MED ORDER — MORPHINE BOLUS VIA INFUSION
5.0000 mg | INTRAVENOUS | Status: DC | PRN
  Filled 2016-06-14: qty 20

## 2016-06-14 MED ORDER — DEXTROSE 50 % IV SOLN
25.0000 g | Freq: Once | INTRAVENOUS | Status: AC
  Administered 2016-06-14: 25 g via INTRAVENOUS

## 2016-06-14 MED ORDER — INSULIN REGULAR HUMAN 100 UNIT/ML IJ SOLN
10.0000 [IU] | Freq: Once | INTRAMUSCULAR | Status: AC
  Administered 2016-06-14: 10 [IU] via INTRAVENOUS
  Filled 2016-06-14: qty 0.1

## 2016-06-14 MED ORDER — SODIUM CHLORIDE 0.9 % IV SOLN: 1.0000 g | Freq: Once | INTRAVENOUS | Status: DC

## 2016-06-14 MED ORDER — INSULIN ASPART 100 UNIT/ML ~~LOC~~ SOLN
10.0000 [IU] | Freq: Once | SUBCUTANEOUS | Status: AC
  Administered 2016-06-14: 10 [IU] via SUBCUTANEOUS
  Filled 2016-06-14: qty 10

## 2016-06-14 MED ORDER — DEXTROSE 50 % IV SOLN
25.0000 mL | Freq: Once | INTRAVENOUS | Status: AC
  Administered 2016-06-14: 25 mL via INTRAVENOUS

## 2016-06-14 MED ORDER — DEXTROSE 50 % IV SOLN
INTRAVENOUS | Status: AC
  Administered 2016-06-14: 25 g via INTRAVENOUS
  Filled 2016-06-14: qty 50

## 2016-06-14 MED ORDER — SODIUM POLYSTYRENE SULFONATE 15 GM/60ML PO SUSP
30.0000 g | Freq: Once | ORAL | Status: AC
  Administered 2016-06-14: 30 g
  Filled 2016-06-14: qty 120

## 2016-06-14 MED ORDER — DEXTROSE 50 % IV SOLN
50.0000 mL | Freq: Once | INTRAVENOUS | Status: AC
  Administered 2016-06-14: 50 mL via INTRAVENOUS
  Filled 2016-06-14: qty 50

## 2016-06-14 MED ORDER — SODIUM BICARBONATE 8.4 % IV SOLN
150.0000 meq | Freq: Once | INTRAVENOUS | Status: AC
  Administered 2016-06-14: 150 meq via INTRAVENOUS
  Filled 2016-06-14: qty 150

## 2016-06-14 MED ORDER — MORPHINE SULFATE 25 MG/ML IV SOLN
1.0000 mg/h | INTRAVENOUS | Status: DC
  Administered 2016-06-14: 1 mg/h via INTRAVENOUS
  Filled 2016-06-14: qty 10

## 2016-06-14 MED ORDER — MORPHINE 100MG IN NS 100ML (1MG/ML) PREMIX INFUSION: 1.0000 mg/h | INTRAVENOUS | Status: DC

## 2016-06-14 MED ORDER — SODIUM BICARBONATE 8.4 % IV SOLN
INTRAVENOUS | Status: DC
  Administered 2016-06-14 (×3): via INTRAVENOUS
  Filled 2016-06-14 (×7): qty 150

## 2016-06-14 MED ORDER — DEXTROSE 50 % IV SOLN: 1.0000 | Freq: Once | INTRAVENOUS | Status: DC

## 2016-06-15 LAB — TYPE AND SCREEN
ABO/RH(D): O POS
Antibody Screen: NEGATIVE
UNIT DIVISION: 0

## 2016-06-15 LAB — BPAM RBC
Blood Product Expiration Date: 201803272359
ISSUE DATE / TIME: 201803030808
Unit Type and Rh: 5100

## 2016-06-16 LAB — CULTURE, RESPIRATORY W GRAM STAIN: Culture: NORMAL

## 2016-06-19 LAB — CULTURE, BLOOD (ROUTINE X 2)
Culture: NO GROWTH
Culture: NO GROWTH

## 2016-07-13 NOTE — Progress Notes (Signed)
Pt. Unresponsive on ventilator, breathing up to 10 breaths over the vent- no s/s of discomfort, no air trapping, settings unchanged. corneals & gag absent, no eye opening, no response to pain, no PRN's given.   AM labs showed hbg of 6.4- type and screen completed, 1 unit of RBC's ordered. K: 6.3, NP made aware and ordered Ca gluconate, insulin, D50, bicarb (see MAR for details) Report given to [facility]li, daughter at bedside.

## 2016-07-13 NOTE — Progress Notes (Signed)
0000 assessment showed GCS of 3.  CDS called and referral made, number on flowsheet. Pt. Not a donor at this time, they will need a callback if pt. Expires, is extubated, or loses her gag reflex.

## 2016-07-13 NOTE — Progress Notes (Signed)
eLink Physician-Brief Progress Note Patient Name: Tracy CarrowLouvenia Drake DOB: 03/18/1946 MRN: [id number]   Date of Service  06/13/2016  HPI/Events of Note  Spoke with Ms. Tracy FennelLatawnya Drake with the [facility] [place] [facility] of Social Services regarding the patient's declining status. She is giving consent for transition to full comfort care and withdrawal of life-sustaining treatment. I have spoken with the patient's bedside nurse and made her aware of the plan of care. I have also placed an order allowing her to pronounce death. A morphine drip will be used to palliate the patient's symptoms. She will undergo a terminal vent wean and extubation as per respiratory therapy protocol. Ms. Tracy AyeHall will be notified by staff at the time of passing and can be reached at [phone] or [phone].  eICU Interventions  Withdrawal order set entered. Nurse notified of plan of care.      Intervention Category Major Interventions: Other:  Tracy Drake 06/12/2016, 7:54 PM

## 2016-07-13 NOTE — Progress Notes (Signed)
2000: Pt. Made comfort care, discussed process with family, notified CDS of status change. Pt. Considered not a candidate, but RN asked to call back with time of death.  2036: initiated morphine gtt- d/c'd pressors and Sodium bicarb (see MAR for details) Extubated pt. From ventilator. Cardiac time of death: 2041.  Verified by this RN and Tana CoastLeslie Lewis, RN. Ms. Margo AyeHall from DSS called and notified, Bradly BienenstockDanielle Monroe form CDS called and notified.  Pt. Not a candidate. Nursing supervisor, charge nurse, Ms. Luci Bankukov, NP and Dr. Marlyne BeardsJennings all notified of passing and time of death.

## 2016-07-13 NOTE — Discharge Summary (Signed)
PULMONARY / CRITICAL CARE MEDICINE   Name: Tracy CarrowLouvenia Dyke MRN: [id number] DOB: 04/06/1946    ADMISSION DATE:  06/05/2016  Admission diagnosis Acute respiratory failure Severe septic shock.  Hx of HTN/HLD CAD Increased troponin likely demand ischemia.  Small bowel  Obstruction Suspect mesenteric ischemia.  ESRD Acute unresponsiveness with metabolic encephalopathy; possibly secondary to septic shock vs. CVA.  Hx of Stroke PVD Diabetic Neuropathy Hx f Stroke  Discharge Diagnosis Acute respiratory failure Severe septic shock.  Hx of HTN/HLD CAD Increased troponin likely demand ischemia.  Small bowel  Obstruction Suspect mesenteric ischemia.  ESRD Acute unresponsiveness with metabolic encephalopathy; possibly secondary to septic shock vs. CVA.  Hx of Stroke PVD Diabetic Neuropathy Hx f Stroke   History of present illness The patient with past medical history of stroke, diabetes and end-stage renal disease on dialysis presents to the emergency department complaining of abdominal pain. The patient states that she has been nauseous and having a bloody nonbilious emesis for 2 days. Initial presentation was significant for clinical dehydration. CT of the abdomen showed a questionable transition point in the middle of the small bowel which could represent obstruction or chronic constipation. Surgical service was consulted who recommended small fluid bolus that may hydrate the intestinal mucosa some more in order to differentiate any obstruction on further imaging. She H&P for rest of the HPI  REVIEW OF SYSTEMS:   Unable to obtain as the patient is critically ill, intubated and on mechanical venti  VITAL SIGNS: BP (!) 85/50   Pulse 91   Temp 97.4 F (36.3 C) (Axillary)   Resp 14   Ht 5\' 4"  (1.626 m)   Wt 115 lb 15.4 oz (52.6 kg)   SpO2 100%   BMI 19.90 kg/m   HEMODYNAMICS:    VENTILATOR SETTINGS: Vent Mode: PRVC FiO2 (%):  [50 %-100 %] 50 % Set Rate:  [14 bmp] 14  bmp Vt Set:  [450 mL] 450 mL PEEP:  [5 cmH20] 5 cmH20 Plateau Pressure:  [18 cmH20] 18 cmH20  INTAKE / OUTPUT: I/O last 3 completed shifts: In: 8735.8 [I.V.:7715.8; Blood:760; IV Piggyback:260] Out: 320 [Emesis/NG output:320]  PHYSICAL EXAMINATION: General:  Sickly,frail appearing AA female now intubated and on mechanical ventilation, unresponsive.  Neuro:  Unresponsive, no gag reflex, no cough. No pupillary responses. Pt does breathe spontaneously over the vent.  HEENT:  AT, [place], Thick white coat on tongue,  Cardiovascular:  S1S2, Regular, no MRG noted Lungs: diminished throughout  Abdomen:  Flat, nontender Musculoskeletal:  Right leg contracture Skin: multiple scars  LABS:  BMET  Recent Labs Lab 06/13/16 1110 12-Feb-2017 0324 12-Feb-2017 1420 12-Feb-2017 1820  NA 140 140 141  --   K 3.9 6.3* 7.0* 7.4*  CL 101 101 96*  --   CO2 21* 9* 9*  --   BUN 43* 44* 46*  --   CREATININE 4.82* 4.88* 4.83*  --   GLUCOSE 171* 96 237*  --     Electrolytes  Recent Labs Lab 06/13/16 0708 06/13/16 1110 12-Feb-2017 0324 12-Feb-2017 1420  CALCIUM 10.5* 9.1 7.1* 6.8*  MG 2.2  --  2.5*  --   PHOS 8.5*  --  14.4*  --     CBC  Recent Labs Lab 06/12/16 0450 06/13/16 1110 12-Feb-2017 0324 12-Feb-2017 1420  WBC 7.3 8.0 4.8  --   HGB 12.3 9.4* 6.4* 7.3*  HCT 37.2 30.2* 21.3* 23.9*  PLT 190 166 81*  --     Coag's No results for input(s): APTT,  INR in the last 168 hours.  Sepsis Markers  Recent Labs Lab 06/13/16 0707 06/13/16 0708 06/13/16 0934 06/13/16 1500 07/07/2016 0324  LATICACIDVEN 7.3*  --  9.0* 10.4*  --   PROCALCITON  --  4.42  --   --  17.99    ABG  Recent Labs Lab 06/13/16 0807  PHART 7.49*  PCO2ART 32  PO2ART 171*    Liver Enzymes  Recent Labs Lab 06/27/2016 0202 06/13/16 1110  AST 17 186*  ALT 14 229*  ALKPHOS 136* 71  BILITOT 0.5 0.4  ALBUMIN 4.0 2.7*    Cardiac Enzymes  Recent Labs Lab 06/13/16 1948 06/13/16 2105 06/26/2016 0324  TROPONINI 1.46*  1.75* 2.85*    Glucose  Recent Labs Lab 06/23/2016 0335 07/09/2016 0735 07/09/2016 1122 06/22/2016 1343 06/19/2016 1433 06/20/2016 1630  GLUCAP 95 100* 51* 48* 96 135*    Imaging No results found.   STUDIES:  06/27/2016 CT abdomen and pelvis>>Small-bowel obstruction, transition poor tentatively identified in the central abdomen. Mesenteric edema and small volume of freefluid in the pericolic gutters. Advanced vascular calcifications.  No aortic aneurysm.. Stool distending the rectum with mild rectal wall thickening, maybe chronic constipation.. Cardiomegaly noted in the included lower thorax. There isaneurysmal dilatation of the ascending aorta, partially included,maximal dimension 4.3 cm. Recommend annual imaging followup by CTA  10/23/15 ECHO>>The cavity size was small. Wall thickness was  increased in a pattern of mild LVH. Systolic function was  vigorous. The estimated ejection fraction was in the range of 65%  to 70%  CULTURES: 3/2 BC>>pending.  3/2 [place]>>  ANTIBIOTICS: 3/2 Ampicillin Salbactum>>  SIGNIFICANT EVENTS: 2/28 Patient admitted to [facility] with sbo 3/2 Patient transferred to the ICU with severe hypotension and altered mental status  LINES/TUBES: 3/2 Right femoral central line>> 3/2 ET tube  Hospital course  PULMONARY A: Intubated for Airway protection Aspiration Pneumonia, copious secretions noted in airway on intubation.   P:   Vent setting established VAP bundle implemented Prn fentanyl/versed  Unasyn Levo gtt  CARDIOVASCULAR A:  Severe septic shock.  Hx of HTN/HLD CAD Increased troponin likely demand ischemia.   P:  Continuous Telemetry Levo gtt Keep MAP Goals >65 Will hold metoprolol and Hydralazine Continue aspirin Continue Lipitor Continue plavix  RENAL A:   ESRD Hyperkalemia P:   Nephrology consuted, not candidate for CRRT.  Continue bicarb drip.    GASTROINTESTINAL A:   Small bowel  Obstruction Suspect mesenteric ischemia.  P:    Surgery consulted, not a surgical candidate.  Famotidine for GIP Continue bowel rest.   HEMATOLOGIC A:   No active issues P:  Heparin for DVT prophylaxis  INFECTIOUS A:   Aspiration PNA Severe septic shock likely due to mesenteric ischemia.  P:   Monitor fever, WBC Follow cultures  ENDOCRINE A:   Diabetes Melitus P:   Blood glucose checks with SSI coverage  NEUROLOGIC A:   Acute unresponsiveness with metabolic encephalopathy; possibly secondary to septic shock vs. CVA.  Hx of Stroke PVD Diabetic Neuropathy Hx f Stroke P:   RASS goal: 0 to -1 Pt is not stable enough to travel for a CT head.  Fentanyl/versed prn Hold Venlafaxine CT head   FAMILY  - Updates: family updated on grave prognosis at bedside.  Left update on phone of legal Rolland Porter [phone]  On 06/17/2016 at about 7pm, patient's state awarded guardian in consultation with Dr. Jamison Neighbor and per family's wishes made patient full comfort care with a one-way extubation. Patient extubated at  2036 and expired at 2041.   Discharge plan Patient expired Family at bedside. Support provided. Taylah Dubiel S. [facility] ANP-BC Pulmonary and Critical Care Medicine [facility] Pager [phone] or [phone]

## 2016-07-13 NOTE — Progress Notes (Signed)
PULMONARY / CRITICAL CARE MEDICINE   Name: Tracy Drake MRN: [id number] DOB: 03/17/1946    ADMISSION DATE:  25-Jun-2016   SUBJECTIVE:  Unable to obtain as the patient is critically ill, intubated and on mechanical ventilation.   REVIEW OF SYSTEMS:   Unable to obtain as the patient is critically ill, intubated and on mechanical venti  VITAL SIGNS: BP 118/60   Pulse (!) 165   Temp 97.3 F (36.3 C) (Axillary)   Resp (!) 25   Ht 5\' 4"  (1.626 m)   Wt 115 lb 15.4 oz (52.6 kg)   SpO2 (!) 83%   BMI 19.90 kg/m   HEMODYNAMICS:    VENTILATOR SETTINGS: Vent Mode: PRVC FiO2 (%):  [6.3 %-100 %] 100 % Set Rate:  [14 bmp] 14 bmp Vt Set:  [14 mL-450 mL] 450 mL PEEP:  [5 cmH20] 5 cmH20 Plateau Pressure:  [23 cmH20] 23 cmH20  INTAKE / OUTPUT: I/O last 3 completed shifts: In: 4501 [P.O.:30; I.V.:4311; IV Piggyback:160] Out: 320 [Emesis/NG output:320]  PHYSICAL EXAMINATION: General:  Sickly,frail appearing AA female now intubated and on mechanical ventilation, unresponsive.  Neuro:  Unresponsive, no gag reflex, no cough. No pupillary responses. Pt does breathe spontaneously over the vent.  HEENT:  AT, [place], Thick white coat on tongue,  Cardiovascular:  S1S2, Regular, no MRG noted Lungs: diminished throughout  Abdomen:  Flat, nontender Musculoskeletal:  Right leg contracture Skin: multiple scars  LABS:  BMET  Recent Labs Lab 06/13/16 0708 06/13/16 1110 06/19/2016 0324  NA 138 140 140  K 4.2 3.9 6.3*  CL 93* 101 101  CO2 28 21* 9*  BUN 40* 43* 44*  CREATININE 5.19* 4.82* 4.88*  GLUCOSE 148* 171* 96    Electrolytes  Recent Labs Lab 06/13/16 0708 06/13/16 1110 06/22/2016 0324  CALCIUM 10.5* 9.1 7.1*  MG 2.2  --  2.5*  PHOS 8.5*  --  14.4*    CBC  Recent Labs Lab 06/12/16 0450 06/13/16 1110 06/23/2016 0324  WBC 7.3 8.0 4.8  HGB 12.3 9.4* 6.4*  HCT 37.2 30.2* 21.3*  PLT 190 166 81*    Coag's No results for input(s): APTT, INR in the last 168  hours.  Sepsis Markers  Recent Labs Lab 06/13/16 0707 06/13/16 0708 06/13/16 0934 06/13/16 1500 07/11/2016 0324  LATICACIDVEN 7.3*  --  9.0* 10.4*  --   PROCALCITON  --  4.42  --   --  17.99    ABG  Recent Labs Lab 06/13/16 0807  PHART 7.49*  PCO2ART 32  PO2ART 171*    Liver Enzymes  Recent Labs Lab 06-25-16 0202 06/13/16 1110  AST 17 186*  ALT 14 229*  ALKPHOS 136* 71  BILITOT 0.5 0.4  ALBUMIN 4.0 2.7*    Cardiac Enzymes  Recent Labs Lab 06/13/16 1948 06/13/16 2105 06/30/2016 0324  TROPONINI 1.46* 1.75* 2.85*    Glucose  Recent Labs Lab 06/13/16 1958 06/13/16 2357 06/30/2016 0015 07/06/2016 0115 07/08/2016 0335 07/03/2016 0735  GLUCAP 71 <10* 113* 89 95 100*    Imaging No results found.   STUDIES:  06-25-2016 CT abdomen and pelvis>>Small-bowel obstruction, transition poor tentatively identified in the central abdomen. Mesenteric edema and small volume of freefluid in the pericolic gutters. Advanced vascular calcifications.  No aortic aneurysm.. Stool distending the rectum with mild rectal wall thickening, maybe chronic constipation.. Cardiomegaly noted in the included lower thorax. There isaneurysmal dilatation of the ascending aorta, partially included,maximal dimension 4.3 cm. Recommend annual imaging followup by CTA  10/23/15 ECHO>>The  cavity size was small. Wall thickness was  increased in a pattern of mild LVH. Systolic function was  vigorous. The estimated ejection fraction was in the range of 65%  to 70%  CULTURES: 3/2 BC>>pending.  3/2 [place]>>  ANTIBIOTICS: 3/2 Ampicillin Salbactum>>  SIGNIFICANT EVENTS: 2/28 [facility] 3/2 Patient transferred to the ICU with severe hypotension and altered mental status  LINES/TUBES: 3/2 Right femoral central line>> 3/2 ET tube  ASSESSMENT / PLAN:  PULMONARY A: Intubated for Airway protection Aspiration Pneumonia, copious secretions noted in airway on intubation.   P:   Vent  setting established VAP bundle implemented Prn fentanyl/versed  Unasyn Levo gtt  CARDIOVASCULAR A:  Severe septic shock.  Hx of HTN/HLD CAD Increased troponin likely demand ischemia.   P:  Continuous Telemetry Levo gtt Keep MAP Goals >65 Will hold metoprolol and Hydralazine Continue aspirin Continue Lipitor Continue plavix  RENAL A:   ESRD Hyperkalemia P:   Nephrology consuted, not candidate for CRRT.  Continue bicarb drip.    GASTROINTESTINAL A:   Small bowel  Obstruction Suspect mesenteric ischemia.  P:   Surgery consulted, not a surgical candidate.  Famotidine for GIP Continue bowel rest.   HEMATOLOGIC A:   No active issues P:  Heparin for DVT prophylaxis  INFECTIOUS A:   Aspiration PNA Severe septic shock likely due to mesenteric ischemia.  P:   Monitor fever, WBC Follow cultures  ENDOCRINE A:   Diabetes Melitus P:   Blood glucose checks with SSI coverage  NEUROLOGIC A:   Acute unresponsiveness with metabolic encephalopathy; possibly secondary to septic shock vs. CVA.  Hx of Stroke PVD Diabetic Neuropathy Hx f Stroke P:   RASS goal: 0 to -1 Pt is not stable enough to travel for a CT head.  Fentanyl/versed prn Hold Venlafaxine CT head   FAMILY  - Updates: family updated on grave prognosis at bedside.  Left update on phone of legal Rolland Porterguardia Tracey McKinney [phone]   -Wells Guileseep Ezma Rehm, M.D.  07/10/2016   Critical Care Attestation.  I have personally obtained a history, examined the patient, evaluated laboratory and imaging results, formulated the assessment and plan and placed orders. The Patient requires high complexity decision making for assessment and support, frequent evaluation and titration of therapies, application of advanced monitoring technologies and extensive interpretation of multiple databases. The patient has critical illness that could lead imminently to failure of 1 or more organ systems and requires the highest  level of physician preparedness to intervene.  Critical Care Time devoted to patient care services described in this note is 65 minutes and is exclusive of time spent in procedures.

## 2016-07-13 NOTE — Progress Notes (Signed)
[facility]  ROUNDING NOTE   Subjective:   Daughter at bedside. Patient made DNR. Family driving from [place]   On norepinephrine, phenylephrine, and vasopressin.   Sodium bicarb gtt  hgb 6.4 - Getting PRBC transfusion.   Objective:  Vital signs in last 24 hours:  Temp:  [97 F (36.1 C)-98.8 F (37.1 C)] 97.3 F (36.3 C) (03/03 0839) Pulse Rate:  [91-157] 118 (03/03 0839) Resp:  [14-30] 24 (03/03 0839) BP: (38-135)/(18-113) 96/66 (03/03 0830) SpO2:  [82 %-100 %] 89 % (03/03 0839) FiO2 (%):  [6.3 %-100 %] 100 % (03/03 0839) Weight:  [52.6 kg (115 lb 15.4 oz)] 52.6 kg (115 lb 15.4 oz) (03/03 0439)  Weight change: 2.795 kg (6 lb 2.6 oz) Filed Weights   06/12/16 0940 06/12/16 1330 July 07, 2016 0439  Weight: 49.8 kg (109 lb 12.8 oz) 50 kg (110 lb 3.7 oz) 52.6 kg (115 lb 15.4 oz)    Intake/Output: I/O last 3 completed shifts: In: 73 [P.O.:30; I.V.:4311; IV Piggyback:160] Out: 320 [Emesis/NG output:320]   Intake/Output this shift:  Total I/O In: 380 [Blood:380] Out: -   Physical Exam: General: Critically Ill  Head: ETT  Eyes: nonreactive pupils  Neck: Supple, trachea midline  Lungs:  Coarse breath sounds bilaterally, PRVC 100%  Heart: Regular rate and rhythm  Abdomen:  Soft  Extremities: no peripheral edema.  Neurologic: Intubated, sedated  Skin: No lesions  Access: Left AVF    Basic Metabolic Panel:  Recent Labs Lab 06/05/2016 0202 06/12/16 0450 06/13/16 0708 06/13/16 1110 07/07/2016 0324  NA 139 142 138 140 140  K 5.2* 6.7* 4.2 3.9 6.3*  CL 103 104 93* 101 101  CO2 _0 21* 9*  GLUCOSE 179* 186* 148* 171* 96  BUN 57* 78* 40* 43* 44*  CREATININE 5.33* 7.35* 5.19* 4.82* 4.88*  CALCIUM 9.8 10.8* 10.5* 9.1 7.1*  MG  --   --  2.2  --  2.5*  PHOS  --   --  8.5*  --  14.4*    Liver Function Tests:  Recent Labs Lab 05/17/2016 0202 06/13/16 1110  AST 17 186*  ALT 14 229*  ALKPHOS 136* 71  BILITOT 0.5 0.4  PROT 7.9  5.7*  ALBUMIN 4.0 2.7*    Recent Labs Lab 05/31/2016 0202  LIPASE 39   No results for input(s): AMMONIA in the last 168 hours.  CBC:  Recent Labs Lab 05/26/2016 0202 06/12/16 0450 06/13/16 1110 07-Jul-2016 0324  WBC 7.5 7.3 8.0 4.8  HGB 12.6 12.3 9.4* 6.4*  HCT 37.5 37.2 30.2* 21.3*  MCV 99.4 99.8 103.6* 110.8*  PLT 189 190 166 81*    Cardiac Enzymes:  Recent Labs Lab 06/13/16 0708 06/13/16 1110 06/13/16 1948 06/13/16 2105 July 07, 2016 0324  TROPONINI 0.04* 0.16* 1.46* 1.75* 2.85*    BNP: Invalid input(s): POCBNP  CBG:  Recent Labs Lab 06/13/16 2357 2016/07/07 0015 2016/07/07 0115 07/07/16 0335 July 07, 2016 0735  GLUCAP <10* 113* 89 95 100*    Microbiology: Results for orders placed or performed during the hospital encounter of 06/06/2016  MRSA PCR Screening     Status: None   Collection Time: 05/30/2016  4:47 PM  Result Value Ref Range Status   MRSA by PCR NEGATIVE NEGATIVE Final    Comment:        The GeneXpert MRSA Assay (FDA approved for NASAL specimens only), is one component of a comprehensive MRSA colonization surveillance program. It is not intended to diagnose MRSA infection nor to  guide or monitor treatment for MRSA infections.   MRSA PCR Screening     Status: None   Collection Time: 06/13/16  7:50 AM  Result Value Ref Range Status   MRSA by PCR NEGATIVE NEGATIVE Final    Comment:        The GeneXpert MRSA Assay (FDA approved for NASAL specimens only), is one component of a comprehensive MRSA colonization surveillance program. It is not intended to diagnose MRSA infection nor to guide or monitor treatment for MRSA infections.   Culture, respiratory (NON-Expectorated)     Status: None (Preliminary result)   Collection Time: 06/13/16  4:40 PM  Result Value Ref Range Status   Specimen Description TRACHEAL ASPIRATE  Final   Special Requests NONE  Final   Gram Stain   Final    ABUNDANT WBC PRESENT,BOTH PMN AND MONONUCLEAR ABUNDANT GRAM  NEGATIVE RODS MODERATE GRAM POSITIVE COCCI IN PAIRS RARE GRAM POSITIVE RODS RARE GRAM NEGATIVE COCCI IN PAIRS Performed at [facility], [place] [address], [place], [place] [address]    Culture PENDING  Incomplete   Report Status PENDING  Incomplete    Coagulation Studies: No results for input(s): LABPROT, INR in the last 72 hours.  Urinalysis: No results for input(s): COLORURINE, LABSPEC, PHURINE, GLUCOSEU, HGBUR, BILIRUBINUR, KETONESUR, PROTEINUR, UROBILINOGEN, NITRITE, LEUKOCYTESUR in the last 72 hours.  Invalid input(s): APPERANCEUR    Imaging: Dg Chest Port 1 View  Result Date: 06/13/2016 CLINICAL DATA:  Hypoxia EXAM: PORTABLE CHEST 1 VIEW COMPARISON:  None. FINDINGS: Endotracheal tube tip is 7 mm above the carina with the tip directed toward the right main bronchus. Nasogastric tube tip and side port are below the diaphragm with the side port seen in the stomach. No pneumothorax. There is atelectatic change in both lower lung zones. There is no airspace consolidation or edema. Heart size and pulmonary vascular normal. There is atherosclerotic calcification in the aorta. Subclavian stent noted on the left. IMPRESSION: Endotracheal tube as described. Endotracheal tube is near the carina with the tube directed toward the right. Advise withdrawing endotracheal tube approximately 2-2.5 cm. Mild atelectasis in the lower lung zones. No edema or consolidation. Aortic atherosclerosis. These results will be called to the ordering clinician or representative by the Radiologist Assistant, and communication documented in the PACS or zVision Dashboard. Electronically Signed   By: Lowella Grip III M.D.   On: 06/13/2016 07:33   Dg Abd Portable 1v  Result Date: 06/13/2016 CLINICAL DATA:  OG tube placement. EXAM: PORTABLE ABDOMEN - 1 VIEW COMPARISON:  CT abdomen and pelvis 06/07/2016 FINDINGS: An enteric tube has been placed and terminates in the left upper quadrant with tip likely in the gastric  body. The side hole is beyond the GE junction as well. There are multiple loops of mildly dilated small bowel in the upper and mid abdomen. The lower abdomen/ pelvis was not included. Postsurgical changes are again noted with numerous sutures in the upper abdomen. No intraperitoneal free air is identified on this supine study. The lungs are more fully evaluated on separate chest radiograph today. IMPRESSION: 1. Enteric tube placement as above. 2. Persistent small bowel dilatation consistent with obstruction as described on CT. Electronically Signed   By: Logan Bores M.D.   On: 06/13/2016 08:03     Medications:   . DOPamine Stopped (06/13/16 0800)  . norepinephrine (LEVOPHED) Adult infusion 60 mcg/min (07-02-2016 0918)  . phenylephrine (NEO-SYNEPHRINE) Adult infusion 400 mcg/min (Jul 02, 2016 0918)  .  sodium bicarbonate  infusion  1000 mL 150 mL/hr at 2016-06-23 0700  . vasopressin (PITRESSIN) infusion - *FOR SHOCK* 0.04 Units/min (06/23/2016 0700)   . ampicillin-sulbactam (UNASYN) IV  1.5 g Intravenous Q24H  . chlorhexidine gluconate (MEDLINE KIT)  15 mL Mouth Rinse BID  . heparin  5,000 Units Subcutaneous Q8H  . insulin aspart  2-6 Units Subcutaneous Q4H  . latanoprost  1 drop Both Eyes QHS  . mouth rinse  15 mL Mouth Rinse 10 times per day  . pantoprazole (PROTONIX) IV  40 mg Intravenous QHS  . sodium chloride flush  10-40 mL Intracatheter Q12H     Assessment/ Plan:  [name] is a 71 y.o. black female with hypertension, diabetes mellitus type II, hyperlipidemia, thyroid disorder and ESRD on hemodialysis.    Respiratory failure 3/2 requiring intubated and mechanical ventilation.    [address]    1. End Stage Renal Disease with hyperkalemia: overall prognosis poor. Did not tolerated dialysis On Thursday, 3/1 Will hold off on dialysis. Discussed with daughter  Will not offer continuous renal replacement therapy.   2. Anemia of chronic kidney disease:  transfusion today - hold epo   3. Secondary Hyperparathyroidism with hyperphosphatemia: PTH 1180, phos 7.9 on 2/13.  - hold cinacalcet due to GI symptoms - hold sevelamer  4. Cardiogenic shock/sepsis - vasopressors, supportive care.     LOS: [place], [place] Mar 12, 20189:35 AM

## 2016-07-13 NOTE — Progress Notes (Signed)
Called and left VM with pt's listed guardian regarding declining status and grim prognosis. Informed family at bedside of same.  Wells Guiles-Deep Garlin Batdorf, M.D.  06/26/2016

## 2016-07-13 NOTE — Progress Notes (Signed)
eLink Physician-Brief Progress Note Patient Name: Verne CarrowLouvenia Char DOB: 01/22/1946 MRN: [id number]   Date of Service  07/06/2016  HPI/Events of Note  Notified by bedside nurse the patient's potassium is continuing to rise and now is 7.4. This continues to trend upward. No comment on hemolysis. Currently actively dieting on 3 vasopressors. Family aware. Currently on bicarbonate drip at 150 mL per hour. Nurse reports enteric feeding tube is in place and patient is having active diarrhea.   eICU Interventions  1. Continuous monitoring on telemetry. 2. Kayexalate 30 g via the tube 1      Intervention Category Major Interventions: End of life / care limitation discussion;Electrolyte abnormality - evaluation and management  Lawanda CousinsJennings Gianlucca Szymborski 07/12/2016, 6:45 PM

## 2016-07-13 NOTE — Progress Notes (Signed)
eLink Physician-Brief Progress Note Patient Name: Tracy CarrowLouvenia Drake DOB: 01/23/1946 MRN: [id number]   Date of Service  06/22/2016  HPI/Events of Note  Notified by bedside nurse that multiple family members were at bedside and requesting to transition to full comfort care. Through camera I was able to discuss and confirm this with patient's daughters and other family members at bedside. Family members at bedside reports that no one else is pending for arrival. Family wished to transition to full comfort and I reviewed the process with them including discontinuation after terminal vent wean of endotracheal intubation and mechanical ventilation. We also discussed discontinuing vasopressor support and palliating patient's pain and dyspnea with a morphine infusion. I explained that legally I would have to discuss this with her guardian through The Department of Social Services for the county. They acknowledged understanding.   eICU Interventions  I attempted to call the patient's legal guardian but only reached a voicemail. I left a voicemail. I have also contacted the Department of Social Services directly and I'm awaiting a call.      Intervention Category Major Interventions: Other:  Tracy CousinsJennings Cayle Drake 06/23/2016, 7:17 PM

## 2016-07-13 NOTE — Progress Notes (Signed)
Pt. BP dropped in 40's systolically, pulses absent in extremities, faint pulse heard on doppler at femoral.  Ms. Luci Bankukov, NP bedside. Pt. Tachycardic into 160's, 3 amps of bicarb given, bicarb gtt started, 1 L NS bolus, pt.'s bed laid flat.  Pt.'s daughter is driving in from [place]. Will continue to monitor pt. Closely.

## 2016-07-13 NOTE — Progress Notes (Signed)
CH responded to a page for a Pt in ICU15 who was actively dying. [facility], the Pt's family was bedside. CH talked to the Pt family and [facility]. [facility] and asked him to visit the Pt's family at 6:10, but the family did not need the Chaplain. CH received another OR for the Pt and visited the Pt's family again at 8:02pm. CH talked to the Pt's family, but they did not need the Chaplain. Chaplain informed the Pt's family that he was available if needed    07/06/2016 2000  Clinical Encounter Type  Visited With Patient;Patient and family together  Visit Type Initial;Follow-up;Spiritual support;Death  Referral From Nurse  Consult/Referral To Chaplain  Spiritual Encounters  Spiritual Needs Prayer;Emotional;Grief support

## 2016-07-13 NOTE — Progress Notes (Signed)
Notified Dr. Jamison NeighborNestor that patient is actively dying- patient's family has decided to make patient comfort care. They are aware that the Legal Guardian has to be contacted and make the final decision.  I gave Dr. Jamison NeighborNestor the Legal guardian's number.  He stated that he would contact her to receive the final decision.  Family at beside.

## 2016-07-13 NOTE — Progress Notes (Signed)
Notified Dr. Jamison NeighborNestor that patient has a repeat potassium of 7.4- MD stated he would order kayexelate for the patient.

## 2016-07-13 NOTE — Progress Notes (Signed)
Notified Dr. Ardyth Manam of patient's potassium of 7 and hgb of 7.3.  MD ordered Calcium chloride 1gram, 10 units of insulin and 1 amp of D50.

## 2016-07-13 DEATH — deceased

## 2016-07-22 ENCOUNTER — Telehealth: Payer: Self-pay | Admitting: *Deleted

## 2016-07-22 NOTE — Telephone Encounter (Signed)
Received DR's folder that had a envelope with a death cert, a check for $44 (check # [id number] dated 07/02/16, made payable to [facility] Vital Records), a paper for certified death certs and business card with an addressed envelope for Records [facility] of [facility] sent by [facility] and [employer] in [place]. (P) [phone] (F) [phone]. All were placed in the envelope and verified what was sent with Alfonso Ramus.

## 2016-08-05 NOTE — Telephone Encounter (Signed)
Received death certificate again from funeral home listed below. called and informed them we received a second death cert and also informed them that any further death certs that they need to handle the money/check portion because we aren't responsible for payments. They asked that we shred the information we received again. Death cert, envelope and check placed in shred bin and verified by Huey Romans.

## 2016-11-14 NOTE — Telephone Encounter (Signed)
Please call regarding this pt death certificate. States she has not gotten the replacement death certificate. Please call.

## 2016-11-14 NOTE — Telephone Encounter (Signed)
Spoke with Clydie BraunKaren on speaker ( myself and [place]) Another death certificate will be sent for completion on this patient. [facility] has not received death certificate yet. Nothing further needed until death cerrticate received.

## 2016-11-14 NOTE — Telephone Encounter (Signed)
Left message for Tracy Drake to return call.Hugo

## 2017-01-15 ENCOUNTER — Telehealth: Payer: Self-pay | Admitting: *Deleted

## 2017-01-15 NOTE — Telephone Encounter (Signed)
Dian Queen from [facility] Vital records dropping off duplicate death certificate that was originally completed in April 2018. There was an issue with the paper the death certificate was processed on and they are requesting a new one.   Death certificate placed on DR desk for completion.

## 2017-01-16 NOTE — Telephone Encounter (Signed)
Contacted Clydie Braun and notified that death cert ready for pick up. Nothing further needed.

## 2018-09-18 IMAGING — CT CT ABD-PELV W/O CM
2 of 4 series · 14 of 46 positions shown, 16 images · non-contrast
Comparison: None.

CLINICAL DATA: Abdominal pain for 30 minutes.  Vomiting.

EXAM:
CT ABDOMEN AND PELVIS WITHOUT CONTRAST
TECHNIQUE: Multidetector CT imaging of the abdomen and pelvis was performed
following the standard protocol without IV contrast.

[Series 2: axial st · axial · 0.66mm/px · z∈[-788,-408]mm · 11 of 92 slices shown, 13 images]
[im 8/92  soft-tissue]
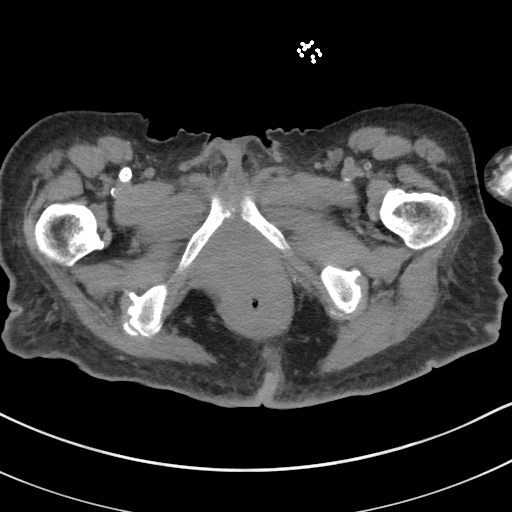
[im 8/92  bone]
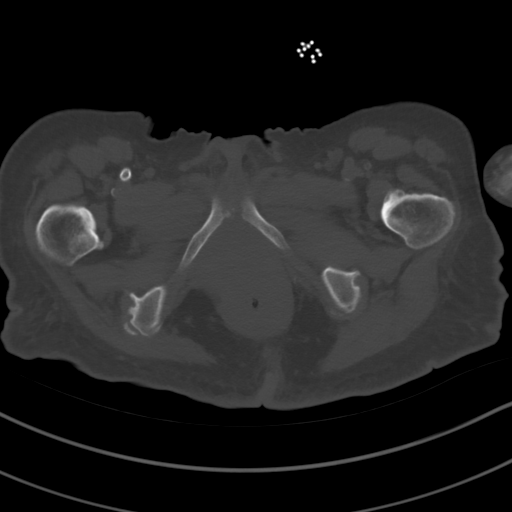
[im 16/92  soft-tissue]
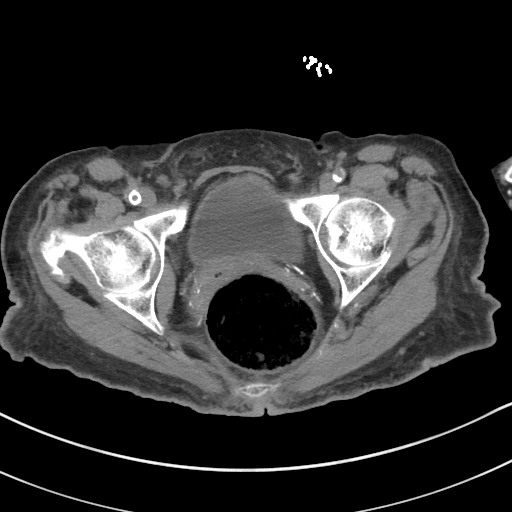
[im 23/92  soft-tissue]
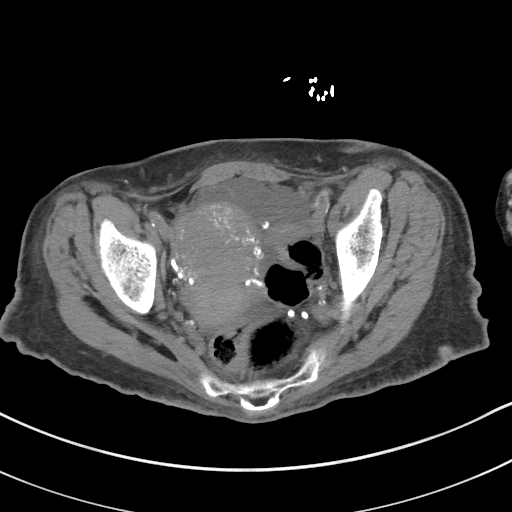
[im 31/92  soft-tissue]
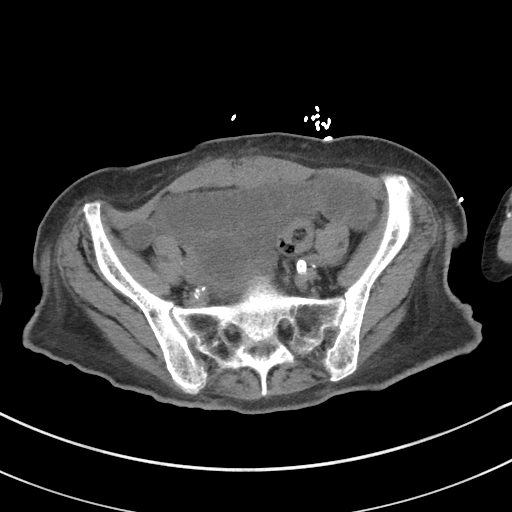
[im 38/92  soft-tissue]
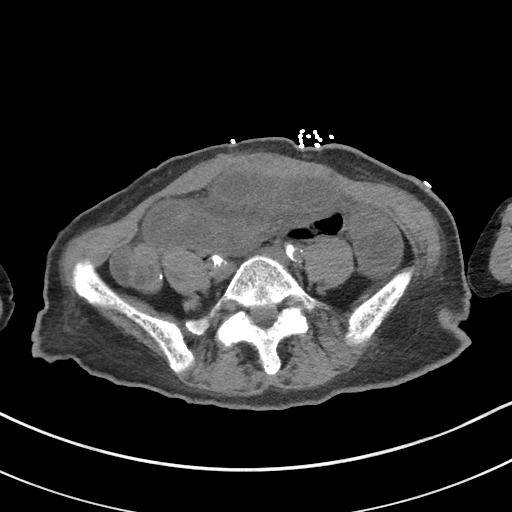
[im 46/92  soft-tissue]
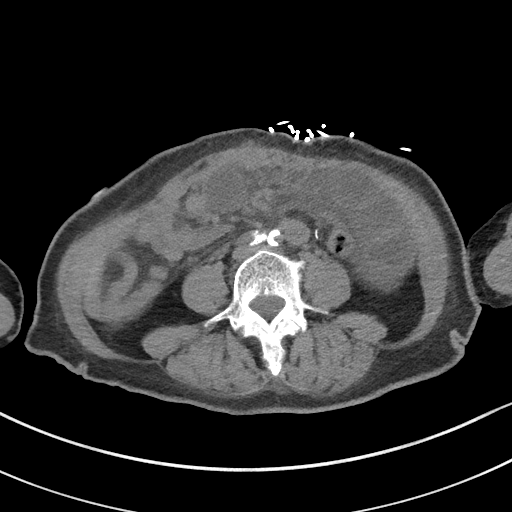
[im 54/92  soft-tissue]
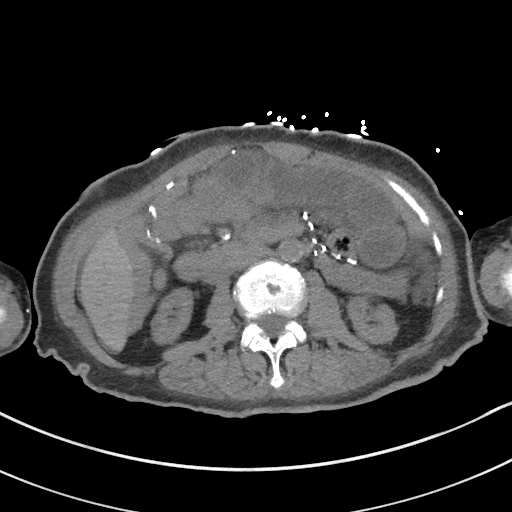
[im 61/92  soft-tissue]
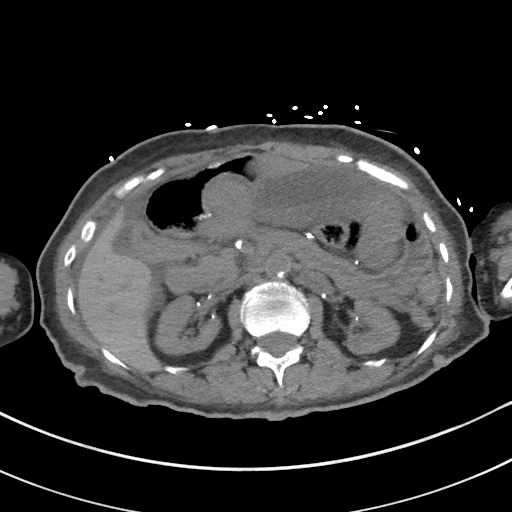
[im 69/92  soft-tissue]
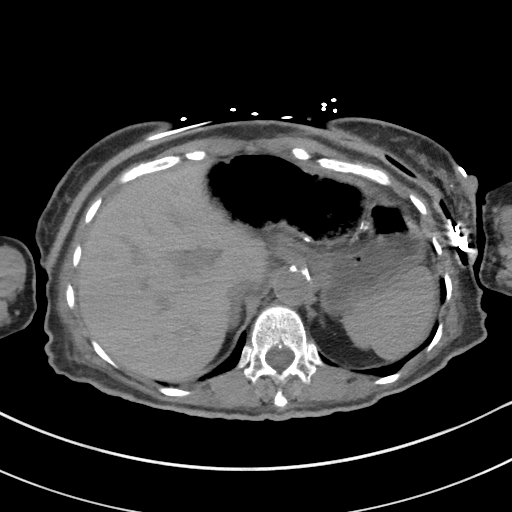
[im 69/92  bone]
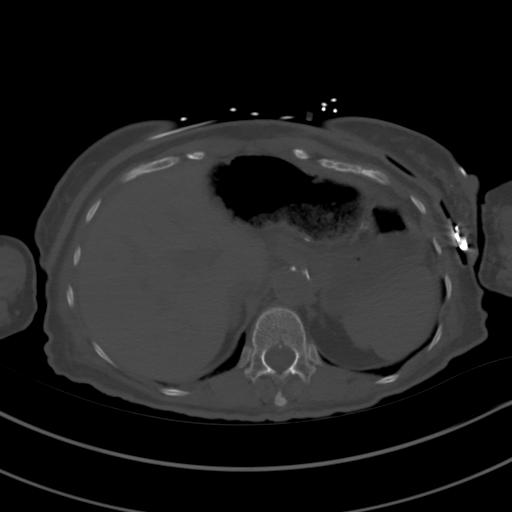
[im 76/92  soft-tissue]
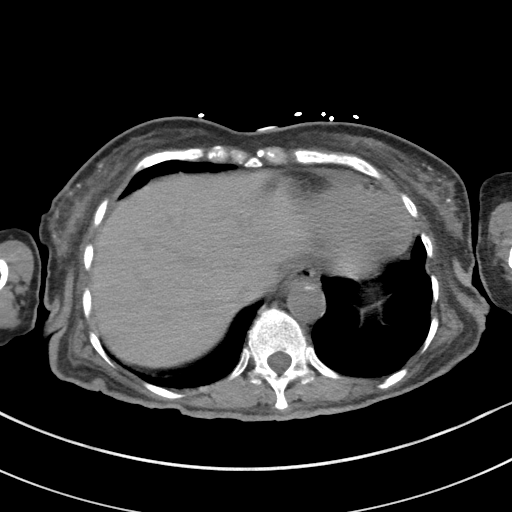
[im 84/92  soft-tissue]
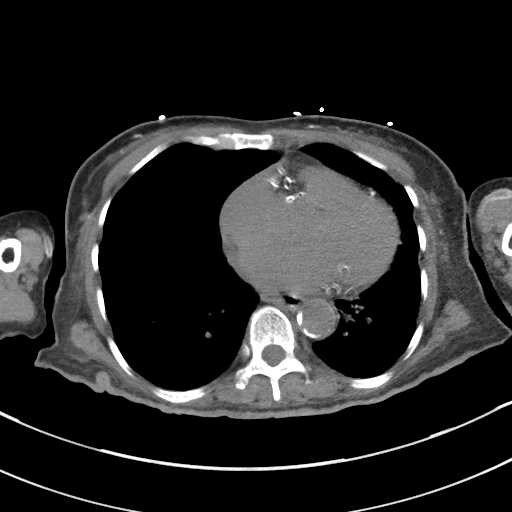

[Series 5: coronal st · coronal · 0.64mm/px · 3 of 73 slices shown]
[im 25/73  soft-tissue]
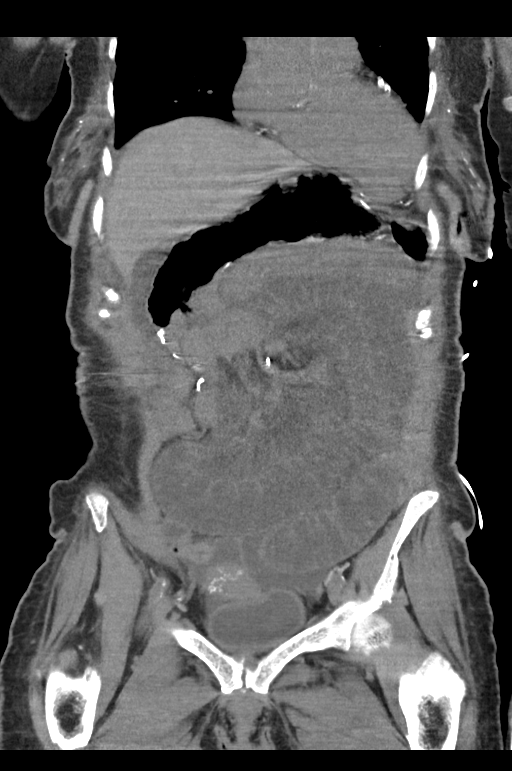
[im 33/73  soft-tissue]
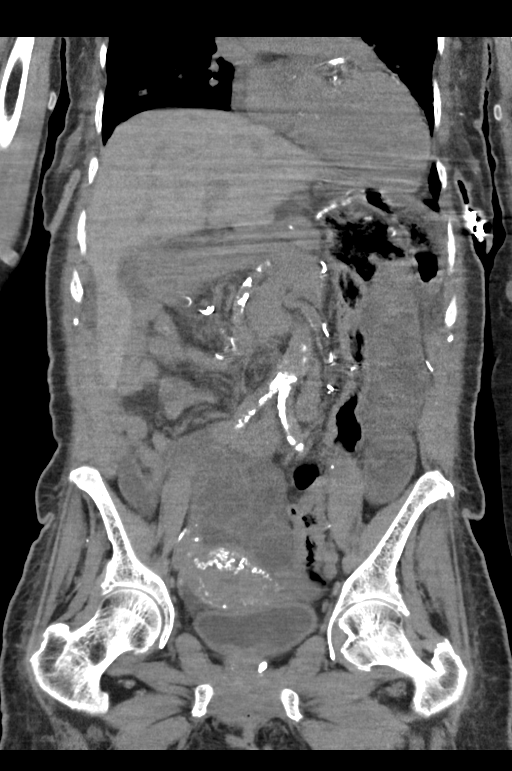
[im 41/73  soft-tissue]
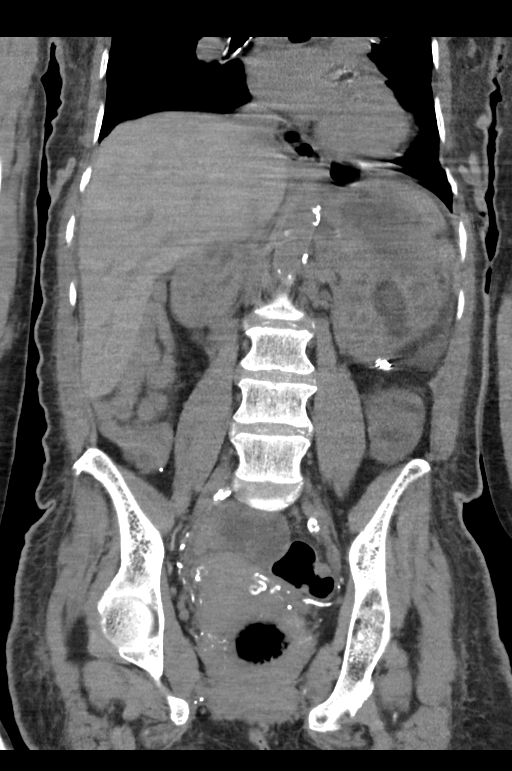

[14 of 46 positions shown; findings below may reference images not displayed]

FINDINGS: Lower chest: Multi chamber cardiomegaly. There are coronary artery
calcifications. Ascending aorta is partially included, however
appears dilated measuring 4.3 cm. Trace pericardial effusion.
Breathing motion artifact partially obscures evaluation.

Hepatobiliary: Motion artifact. Allowing for this, no focal hepatic
lesion. Small volume of perihepatic fluid adjacent to the inferior
liver tip. Gallbladder tentatively identified and nondistended.

Pancreas: Motion obscured, no peripancreatic inflammation or
evidence pancreatitis.

Spleen: Normal in size.

Adrenals/Urinary Tract: No adrenal nodule. Atrophic kidneys with
small cortical cysts. No hydronephrosis. Bladder is minimally
distended.

Stomach/Bowel: Motion and paucity of intra-abdominal fat limits
bowel assessment. Stomach is not well-defined, appears decompressed.
Dilated edematous appearing fluid-filled small bowel in the central
and left abdomen. Associated mesenteric haziness. Transition point
is tentatively identified in the central abdomen image 40 series 2.
Distal small bowel loops are decompressed. There is stool distending
the rectum with rectal wall thickening. More proximal colon is
nondistended, enteric sutures noted in the splenic flexure and
possibly hepatic flexures. No pneumatosis.

Vascular/Lymphatic: Advanced aortic and branch atherosclerosis. No
aneurysm or periaortic soft tissue stranding. Limited assessment for
adenopathy given lack of body fat and noncontrast technique.

Reproductive: Calcified uterine fibroids.  No gross adnexal mass.

Other: Multiple surgical clips throughout the abdomen. Small volume
of free fluid in the left and right pericolic gutter.

Musculoskeletal: There are no acute or suspicious osseous
abnormalities. Chronic changes in the spine.
IMPRESSION: 1. Small-bowel obstruction, transition poor tentatively identified
in the central abdomen. Mesenteric edema and small volume of free
fluid in the pericolic gutters.
2. Advanced vascular calcifications.  No aortic aneurysm.
3. Stool distending the rectum with mild rectal wall thickening, may
be chronic constipation.
4. Cardiomegaly noted in the included lower thorax. There is
aneurysmal dilatation of the ascending aorta, partially included,
maximal dimension 4.3 cm. Recommend annual imaging followup by CTA
or MRA. This recommendation follows 4717
ACCF/AHA/AATS/ACR/ASA/SCA/POGRADECI/TONI-MATTI/JIM/HANK Guidelines for the
Diagnosis and Management of Patients with Thoracic Aortic Disease.
Circulation. 4717; 121: [id number]

## 2018-09-20 IMAGING — DX DG ABD PORTABLE 1V
1 series · 1 of 1 positions shown · non-contrast
Comparison: CT abdomen and pelvis 06/11/2016

CLINICAL DATA: OG tube placement.

EXAM:
PORTABLE ABDOMEN - 1 VIEW

[abdomen kub]
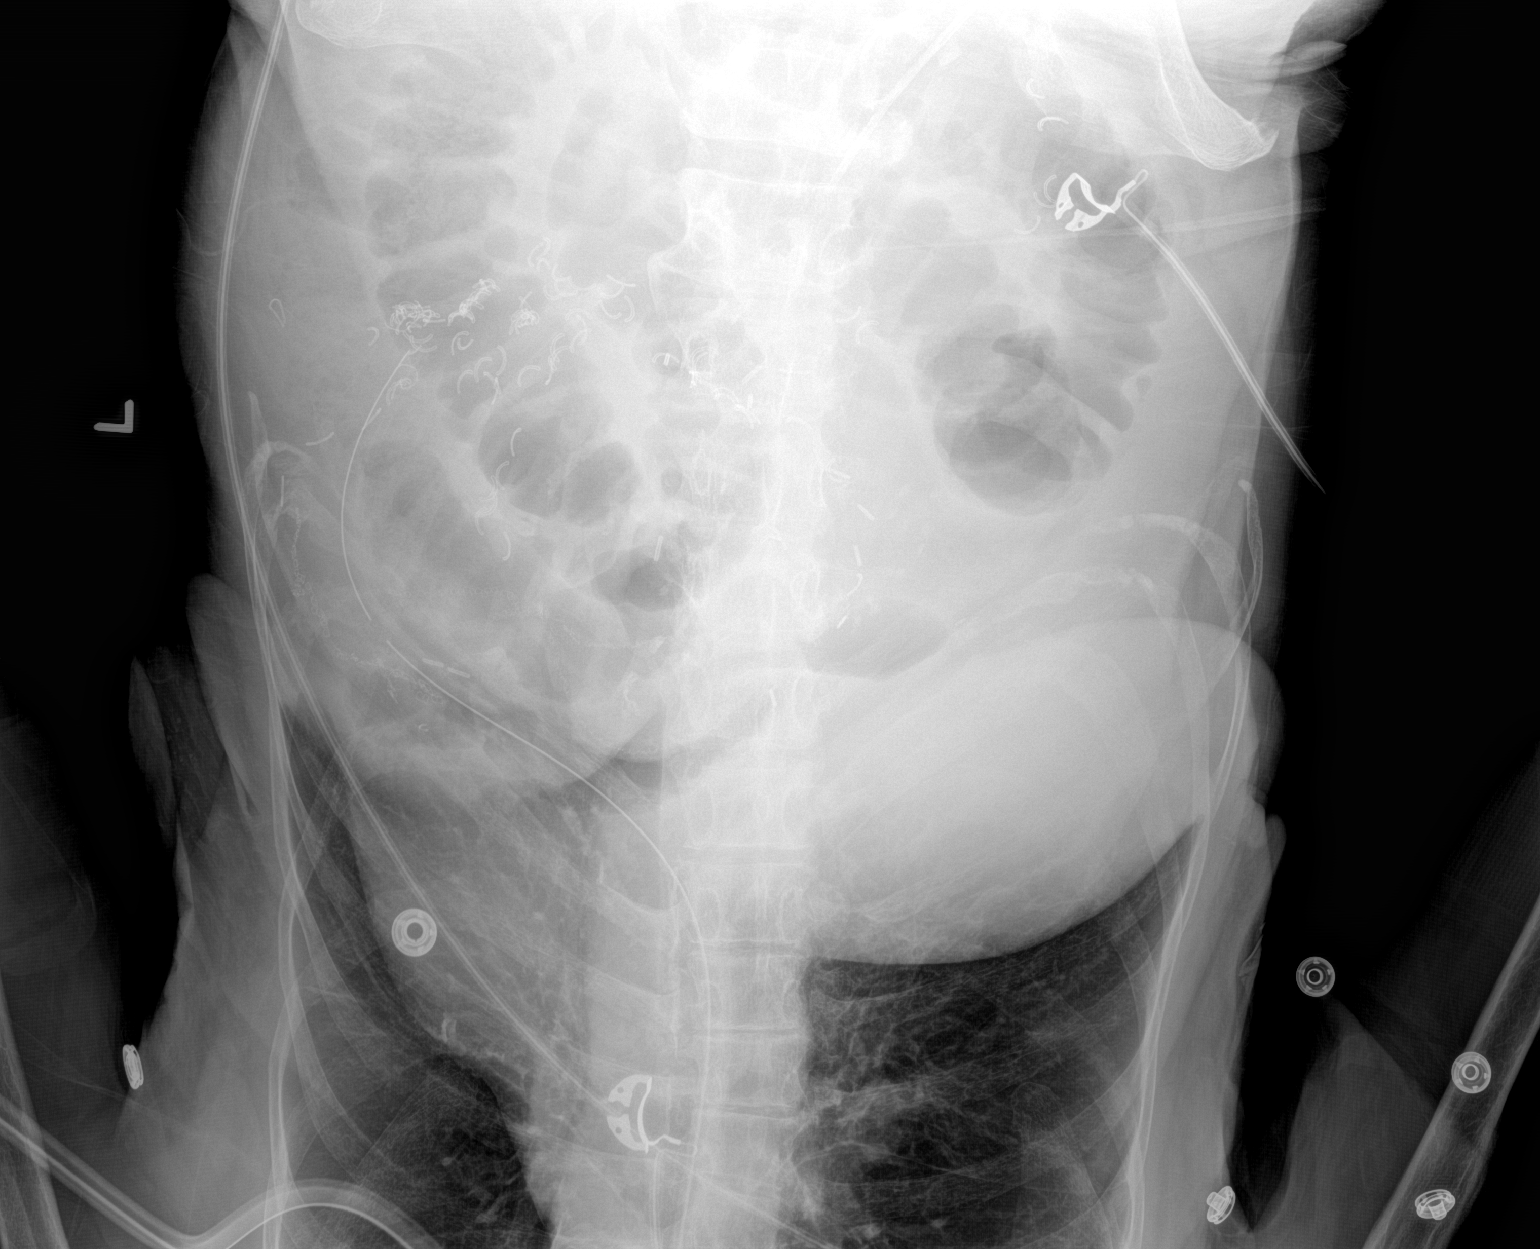

[1 of 1 positions shown; findings below may reference images not displayed]

FINDINGS: An enteric tube has been placed and terminates in the left upper
quadrant with tip likely in the gastric body. The side hole is
beyond the GE junction as well. There are multiple loops of mildly
dilated small bowel in the upper and mid abdomen. The lower abdomen/
pelvis was not included. Postsurgical changes are again noted with
numerous sutures in the upper abdomen. No intraperitoneal free air
is identified on this supine study. The lungs are more fully
evaluated on separate chest radiograph today.
IMPRESSION: 1. Enteric tube placement as above.
2. Persistent small bowel dilatation consistent with obstruction as
described on CT.
